# Patient Record
Sex: Male | Born: 1955 | Race: Black or African American | Hispanic: No | Marital: Married | Smoking: Never smoker
Health system: Southern US, Community
[De-identification: ages and names within clinical notes are randomized; demographics above are authoritative.]

## PROBLEM LIST (undated history)

## (undated) DIAGNOSIS — I10 Essential (primary) hypertension: Secondary | ICD-10-CM

## (undated) DIAGNOSIS — I499 Cardiac arrhythmia, unspecified: Secondary | ICD-10-CM

## (undated) DIAGNOSIS — F419 Anxiety disorder, unspecified: Secondary | ICD-10-CM

## (undated) DIAGNOSIS — IMO0001 Reserved for inherently not codable concepts without codable children: Secondary | ICD-10-CM

## (undated) DIAGNOSIS — N200 Calculus of kidney: Secondary | ICD-10-CM

## (undated) DIAGNOSIS — I509 Heart failure, unspecified: Secondary | ICD-10-CM

## (undated) HISTORY — DX: Heart failure, unspecified: I50.9

## (undated) HISTORY — PX: KNEE SURGERY: SHX244

---

## 2008-06-23 ENCOUNTER — Emergency Department: Payer: Self-pay | Admitting: Emergency Medicine

## 2009-10-18 ENCOUNTER — Emergency Department: Payer: Self-pay | Admitting: Emergency Medicine

## 2012-01-16 ENCOUNTER — Emergency Department: Payer: Self-pay | Admitting: Emergency Medicine

## 2014-11-19 ENCOUNTER — Observation Stay
Admit: 2014-11-19 | Discharge: 2014-11-19 | Disposition: A | Discharge status: Home / Self Care | Payer: No Typology Code available for payment source | Attending: Internal Medicine | Admitting: Internal Medicine

## 2014-11-19 ENCOUNTER — Observation Stay: Payer: No Typology Code available for payment source

## 2014-11-19 ENCOUNTER — Emergency Department: Payer: No Typology Code available for payment source

## 2014-11-19 ENCOUNTER — Encounter: Payer: Self-pay | Admitting: *Deleted

## 2014-11-19 ENCOUNTER — Observation Stay
Admission: EM | Admit: 2014-11-19 | Discharge: 2014-11-20 | Disposition: A | Discharge status: Home / Self Care | Payer: No Typology Code available for payment source | Attending: Internal Medicine | Admitting: Internal Medicine

## 2014-11-19 DIAGNOSIS — I420 Dilated cardiomyopathy: Secondary | ICD-10-CM | POA: Insufficient documentation

## 2014-11-19 DIAGNOSIS — I10 Essential (primary) hypertension: Secondary | ICD-10-CM | POA: Diagnosis not present

## 2014-11-19 DIAGNOSIS — R0602 Shortness of breath: Secondary | ICD-10-CM | POA: Diagnosis not present

## 2014-11-19 DIAGNOSIS — Z87442 Personal history of urinary calculi: Secondary | ICD-10-CM | POA: Insufficient documentation

## 2014-11-19 DIAGNOSIS — I517 Cardiomegaly: Secondary | ICD-10-CM | POA: Diagnosis present

## 2014-11-19 DIAGNOSIS — E785 Hyperlipidemia, unspecified: Secondary | ICD-10-CM | POA: Insufficient documentation

## 2014-11-19 DIAGNOSIS — I2510 Atherosclerotic heart disease of native coronary artery without angina pectoris: Secondary | ICD-10-CM | POA: Diagnosis not present

## 2014-11-19 DIAGNOSIS — R0600 Dyspnea, unspecified: Secondary | ICD-10-CM | POA: Diagnosis not present

## 2014-11-19 DIAGNOSIS — R0789 Other chest pain: Principal | ICD-10-CM | POA: Insufficient documentation

## 2014-11-19 DIAGNOSIS — I447 Left bundle-branch block, unspecified: Secondary | ICD-10-CM | POA: Insufficient documentation

## 2014-11-19 DIAGNOSIS — R079 Chest pain, unspecified: Secondary | ICD-10-CM | POA: Insufficient documentation

## 2014-11-19 HISTORY — DX: Calculus of kidney: N20.0

## 2014-11-19 LAB — TROPONIN I
TROPONIN I: 0.03 ng/mL (ref <0.031)
Troponin I: 0.03 ng/mL (ref <0.031)
Troponin I: 0.03 ng/mL (ref <0.031)
Troponin I: <0.03 ng/mL (ref <0.031)

## 2014-11-19 LAB — CBC
HCT: 43.7 % (ref 40.0–52.0)
HEMOGLOBIN: 14.4 g/dL (ref 13.0–18.0)
MCH: 27.3 pg (ref 26.0–34.0)
MCHC: 33 g/dL (ref 32.0–36.0)
MCV: 82.7 fL (ref 80.0–100.0)
Platelets: 142 10*3/uL — ABNORMAL LOW (ref 150–440)
RBC: 5.29 MIL/uL (ref 4.40–5.90)
RDW: 13.6 % (ref 11.5–14.5)
WBC: 8.9 10*3/uL (ref 3.8–10.6)

## 2014-11-19 LAB — NM MYOCAR MULTI W/SPECT W/WALL MOTION / EF
CHL CUP NUCLEAR SDS: 4
LV dias vol: 253 mL
LVSYSVOL: 194 mL
SRS: 0
SSS: 4
TID: 1.68

## 2014-11-19 LAB — BASIC METABOLIC PANEL
Anion gap: 9 (ref 5–15)
BUN: 16 mg/dL (ref 6–20)
CHLORIDE: 107 mmol/L (ref 101–111)
CO2: 24 mmol/L (ref 22–32)
Calcium: 9.2 mg/dL (ref 8.9–10.3)
Creatinine, Ser: 1.27 mg/dL — ABNORMAL HIGH (ref 0.61–1.24)
GFR calc Af Amer: >60 mL/min (ref >60)
Glucose, Bld: 138 mg/dL — ABNORMAL HIGH (ref 65–99)
Potassium: 3.6 mmol/L (ref 3.5–5.1)
Sodium: 140 mmol/L (ref 135–145)

## 2014-11-19 LAB — LIPID PANEL
CHOLESTEROL: 184 mg/dL (ref 0–200)
HDL: 40 mg/dL — ABNORMAL LOW (ref >40)
LDL Cholesterol: 106 mg/dL — ABNORMAL HIGH (ref 0–99)
Total CHOL/HDL Ratio: 4.6 RATIO
Triglycerides: 188 mg/dL — ABNORMAL HIGH (ref <150)
VLDL: 38 mg/dL (ref 0–40)

## 2014-11-19 LAB — BRAIN NATRIURETIC PEPTIDE: B Natriuretic Peptide: 229 pg/mL — ABNORMAL HIGH (ref 0.0–100.0)

## 2014-11-19 MED ORDER — HEPARIN SODIUM (PORCINE) 5000 UNIT/ML IJ SOLN
5000.0000 [IU] | Freq: Three times a day (TID) | INTRAMUSCULAR | Status: DC
  Administered 2014-11-19 – 2014-11-20 (×4): 5000 [IU] via SUBCUTANEOUS
  Filled 2014-11-19 (×4): qty 1

## 2014-11-19 MED ORDER — VITAMIN B-6 100 MG PO TABS
500.0000 mg | ORAL_TABLET | Freq: Every day | ORAL | Status: DC
  Administered 2014-11-19: 500 mg via ORAL
  Filled 2014-11-19 (×2): qty 5

## 2014-11-19 MED ORDER — TECHNETIUM TC 99M SESTAMIBI - CARDIOLITE
33.0000 | Freq: Once | INTRAVENOUS | Status: AC | PRN
  Administered 2014-11-19: 10:00:00 32.365 via INTRAVENOUS

## 2014-11-19 MED ORDER — ASPIRIN EC 81 MG PO TBEC
81.0000 mg | DELAYED_RELEASE_TABLET | Freq: Every day | ORAL | Status: DC
  Administered 2014-11-19: 81 mg via ORAL
  Filled 2014-11-19: qty 1

## 2014-11-19 MED ORDER — ONDANSETRON HCL 4 MG PO TABS: 4.0000 mg | ORAL_TABLET | Freq: Four times a day (QID) | ORAL | Status: DC | PRN

## 2014-11-19 MED ORDER — ONDANSETRON HCL 4 MG/2ML IJ SOLN: 4.0000 mg | Freq: Four times a day (QID) | INTRAMUSCULAR | Status: DC | PRN

## 2014-11-19 MED ORDER — TECHNETIUM TC 99M SESTAMIBI - CARDIOLITE
14.0650 | Freq: Once | INTRAVENOUS | Status: AC | PRN
  Administered 2014-11-19: 14.065 via INTRAVENOUS

## 2014-11-19 MED ORDER — SODIUM CHLORIDE 0.9 % IJ SOLN
3.0000 mL | Freq: Two times a day (BID) | INTRAMUSCULAR | Status: DC
  Administered 2014-11-19 (×3): 3 mL via INTRAVENOUS

## 2014-11-19 MED ORDER — METOPROLOL TARTRATE 50 MG PO TABS
50.0000 mg | ORAL_TABLET | Freq: Two times a day (BID) | ORAL | Status: DC
  Administered 2014-11-19 – 2014-11-20 (×3): 50 mg via ORAL
  Filled 2014-11-19 (×3): qty 1

## 2014-11-19 MED ORDER — ADULT MULTIVITAMIN W/MINERALS CH
1.0000 | ORAL_TABLET | Freq: Every day | ORAL | Status: DC
  Administered 2014-11-19 – 2014-11-20 (×2): 1 via ORAL
  Filled 2014-11-19 (×2): qty 1

## 2014-11-19 MED ORDER — MORPHINE SULFATE 2 MG/ML IJ SOLN: 2.0000 mg | INTRAMUSCULAR | Status: DC | PRN

## 2014-11-19 MED ORDER — OXYCODONE HCL 5 MG PO TABS: 5.0000 mg | ORAL_TABLET | ORAL | Status: DC | PRN

## 2014-11-19 MED ORDER — ATORVASTATIN CALCIUM 10 MG PO TABS
10.0000 mg | ORAL_TABLET | Freq: Every day | ORAL | Status: DC
  Administered 2014-11-19 – 2014-11-20 (×2): 10 mg via ORAL
  Filled 2014-11-19 (×2): qty 1

## 2014-11-19 MED ORDER — ASPIRIN 81 MG PO CHEW
81.0000 mg | CHEWABLE_TABLET | ORAL | Status: AC
  Administered 2014-11-20: 81 mg via ORAL
  Filled 2014-11-19: qty 1

## 2014-11-19 MED ORDER — ACETAMINOPHEN 650 MG RE SUPP: 650.0000 mg | Freq: Four times a day (QID) | RECTAL | Status: DC | PRN

## 2014-11-19 MED ORDER — ASPIRIN 81 MG PO TBEC: 81.0000 mg | DELAYED_RELEASE_TABLET | Freq: Every day | ORAL | Status: DC

## 2014-11-19 MED ORDER — ACETAMINOPHEN 325 MG PO TABS: 650.0000 mg | ORAL_TABLET | Freq: Four times a day (QID) | ORAL | Status: DC | PRN

## 2014-11-19 NOTE — ED Notes (Signed)
Admitting MD at bedside.

## 2014-11-19 NOTE — ED Notes (Signed)
Pt arrives via ems with c/o chest pressure. En route, ekg normal and vss. No meds prior to arrival .

## 2014-11-19 NOTE — H&P (Signed)
[facility] - [place] at [facility]   PATIENT NAME: Jerry Howe    MR#:  [id number]  DATE OF BIRTH:  Feb 07, 1956   DATE OF ADMISSION:  11/19/2014  PRIMARY CARE PHYSICIAN: No primary care provider on file.   REQUESTING/REFERRING PHYSICIAN: Manson Passey  CHIEF COMPLAINT:   Chief Complaint  Patient presents with  . Chest Pain    HISTORY OF PRESENT ILLNESS:  Jerry Howe  is a 59 y.o. male without significant past medical history (that doesn't seek frequent medical care) presenting with chest pain. Chest pain occurred during exertion, states that he was lifting heavy objects (excess of 100 pounds) for work. During that time. He developed a retrosternal chest pain described as pressure in quality 8/10 intensity nonradiating and no worsening  Factors, relieved with rest. He also describes associated shortness of breath. All symptoms resolved at this time  PAST MEDICAL HISTORY:   Past Medical History  Diagnosis Date  . Kidney stones     PAST SURGICAL HISTORY:  History reviewed. No pertinent past surgical history.  SOCIAL HISTORY:   History  Substance Use Topics  . Smoking status: Never Smoker   . Smokeless tobacco: Not on file  . Alcohol Use: No    FAMILY HISTORY:   Family History  Problem Relation Age of Onset  . Heart failure Neg Hx   Diabetes multiple family members  DRUG ALLERGIES:   Allergies  Allergen Reactions  . Shellfish Allergy Anaphylaxis    REVIEW OF SYSTEMS:  REVIEW OF SYSTEMS:  CONSTITUTIONAL: Denies fevers, chills, fatigue, weakness.  EYES: Denies blurred vision, double vision, or eye pain.  EARS, NOSE, THROAT: Denies tinnitus, ear pain, hearing loss.  RESPIRATORY: denies cough,positive  shortness of breath, wheezing  CARDIOVASCULAR:Positive chest pain  denies palpitations, edema.  GASTROINTESTINAL: Denies nausea, vomiting, diarrhea, abdominal pain.  GENITOURINARY: Denies dysuria, hematuria.  ENDOCRINE: Denies nocturia or  thyroid problems. HEMATOLOGIC AND LYMPHATIC: Denies easy bruising or bleeding.  SKIN: Denies rash or lesions.  MUSCULOSKELETAL: Denies pain in neck, back, shoulder, knees, hips, or further arthritic symptoms.  NEUROLOGIC: Denies paralysis, paresthesias.  PSYCHIATRIC: Denies anxiety or depressive symptoms. Otherwise full review of systems performed by me is negative.   MEDICATIONS AT HOME:   Prior to Admission medications   Medication Sig Start Date End Date Taking? Authorizing Provider  Cyanocobalamin (VITAMIN B 12 PO) Take 1 tablet by mouth daily.   Yes Historical Provider, MD  Multiple Vitamin (MULTIVITAMIN WITH MINERALS) TABS tablet Take 1 tablet by mouth daily.   Yes Historical Provider, MD  Pyridoxine HCl (VITAMIN B-6) 500 MG tablet Take 500 mg by mouth daily.   Yes Historical Provider, MD      VITAL SIGNS:  Blood pressure 143/87, pulse 73, resp. rate 17, height 5\' 8"  (1.727 m), weight 244 lb (110.678 kg), SpO2 97 %.  PHYSICAL EXAMINATION:  VITAL SIGNS: Filed Vitals:   11/19/14 0230  BP: 143/87  Pulse: 73  Resp: 17   GENERAL:58 y.o.male currently in no acute distress.  HEAD: Normocephalic, atraumatic.  EYES: Pupils equal, round, reactive to light. Extraocular muscles intact. No scleral icterus.  MOUTH: Moist mucosal membrane. Dentition intact. No abscess noted.  EAR, NOSE, THROAT: Clear without exudates. No external lesions.  NECK: Supple. No thyromegaly. No nodules. No JVD.  PULMONARY: Clear to ascultation, without wheeze rails or rhonci. No use of accessory muscles, Good respiratory effort. good air entry bilaterally CHEST: Nontender to palpation.  CARDIOVASCULAR: S1 and S2. Regular rate and rhythm. No  murmurs, rubs, or gallops. No edema. Pedal pulses 2+ bilaterally.  GASTROINTESTINAL: Soft, nontender, nondistended. No masses. Positive bowel sounds. No hepatosplenomegaly.  MUSCULOSKELETAL: No swelling, clubbing, or edema. Range of motion full in all extremities.   NEUROLOGIC: Cranial nerves II through XII are intact. No gross focal neurological deficits. Sensation intact. Reflexes intact.  SKIN: No ulceration, lesions, rashes, or cyanosis. Skin warm and dry. Turgor intact.  PSYCHIATRIC: Mood, affect within normal limits. The patient is awake, alert and oriented x 3. Insight, judgment intact.    LABORATORY PANEL:   CBC  Recent Labs Lab 11/19/14 0018  WBC 8.9  HGB 14.4  HCT 43.7  PLT 142*   ------------------------------------------------------------------------------------------------------------------  Chemistries   Recent Labs Lab 11/19/14 0018  NA 140  K 3.6  CL 107  CO2 24  GLUCOSE 138*  BUN 16  CREATININE 1.27*  CALCIUM 9.2   ------------------------------------------------------------------------------------------------------------------  Cardiac Enzymes  Recent Labs Lab 11/19/14 0018  TROPONINI <0.03   ------------------------------------------------------------------------------------------------------------------  RADIOLOGY:  Dg Chest 2 View  11/19/2014   CLINICAL DATA:  Chest pressure  EXAM: CHEST  2 VIEW  COMPARISON:  None.  FINDINGS: There is an enlarged cardiac silhouette due to cardiomegaly or pericardial effusion. The lungs are clear. There is no pleural effusion. Hilar and mediastinal contours are normal.  IMPRESSION: Enlarged cardiac silhouette.   Electronically Signed   By: Ellery Plunk M.D.   On: 11/19/2014 00:34    EKG:   Orders placed or performed during the hospital encounter of 11/19/14  . ED EKG within 10 minutes  . ED EKG within 10 minutes    IMPRESSION AND PLAN:   60 year old African-American gentleman without significant history presenting with chest pain.  1. Chest pain, central: Initiate aspirin, statin therapy place on telemetry trend cardiac enzymes, will order stress testing given classic symptoms 2. Hypertension essential: Blood pressure elevated on arrival currently in better  range will likely need chronic medication prior to discharge follow-up blood pressure trend for now 3. Venous thrombus embolism prophylactic: Heparin subcutaneous   All the records are reviewed and case discussed with ED provider. Management plans discussed with the patient, family and they are in agreement.  CODE STATUS: Full  TOTAL TIME TAKING CARE OF THIS PATIENT: 35  minutes.    Hower,  Mardi Mainland.D on 11/19/2014 at 2:48 AM  Between 7am to 6pm - Pager - [phone]  After 6pm: House Pager: - [phone]  [facility]  Office  [phone]  CC: Primary care physician; No primary care provider on file.

## 2014-11-19 NOTE — ED Notes (Signed)
MD at bedside. 

## 2014-11-19 NOTE — Progress Notes (Signed)
Cardiac cath education  Brochure provided,  Consent sign in the chart , no distress noted.

## 2014-11-19 NOTE — Progress Notes (Signed)
*  PRELIMINARY RESULTS* Echocardiogram 2D Echocardiogram has been performed.  Georgann Housekeeper Hege 11/19/2014, 11:36 AM

## 2014-11-19 NOTE — Consult Note (Signed)
[facility] Cardiology  CARDIOLOGY CONSULT NOTE  Patient ID: Jerry Howe MRN: [id number] DOB/AGE: 10/27/1955 59 y.o.  Admit date: 11/19/2014 Referring Physician Hower Primary Physician no primary care provider Primary Cardiologist  Reason for Consultation progressive exertional dyspnea  HPI: 59 year old gentleman with progressive exertional dyspnea, severely reduced left ventricular function, and evidence of ischemia on ET sestamibi study. The patient reports a 6-12 month history of progressive exertional dyspnea. Patient was recently at work, experience shortness of breath with chest tightness. EMS was called, patient was brought to [facility] emergency room where initial EKG revealed sinus rhythm with incomplete left bundle-branch block. Patient ruled out for myocardial infarction by CPK isoenzymes and troponin. The patient underwent ETT sestamibi study earlier today, exercised 6 minutes on a Bruce protocol with shortness of breath. The scintigraphy revealed LV ejection fraction of 20%. SPECT imaging revealed evidence for anterior and apical wall ischemia.  Review of systems complete and found to be negative unless listed above     Past Medical History  Diagnosis Date  . Kidney stones     History reviewed. No pertinent past surgical history.  Prescriptions prior to admission  Medication Sig Dispense Refill Last Dose  . Cyanocobalamin (VITAMIN B 12 PO) Take 1 tablet by mouth daily.     . Multiple Vitamin (MULTIVITAMIN WITH MINERALS) TABS tablet Take 1 tablet by mouth daily.     . Pyridoxine HCl (VITAMIN B-6) 500 MG tablet Take 500 mg by mouth daily.      History   Social History  . Marital Status: Married    Spouse Name: N/A  . Number of Children: N/A  . Years of Education: N/A   Occupational History  . Not on file.   Social History Main Topics  . Smoking status: Never Smoker   . Smokeless tobacco: Not on file  . Alcohol Use: No  . Drug Use: No  . Sexual Activity: Not on file    Other Topics Concern  . Not on file   Social History Narrative  . No narrative on file    Family History  Problem Relation Age of Onset  . Heart failure Neg Hx       Review of systems complete and found to be negative unless listed above      PHYSICAL EXAM  General: Well developed, well nourished, in no acute distress HEENT:  Normocephalic and atramatic Neck:  No JVD.  Lungs: Clear bilaterally to auscultation and percussion. Heart: HRRR . Normal S1 and S2 without gallops or murmurs.  Abdomen: Bowel sounds are positive, abdomen soft and non-tender  Msk:  Back normal, normal gait. Normal strength and tone for age. Extremities: No clubbing, cyanosis or edema.   Neuro: Alert and oriented X 3. Psych:  Good affect, responds appropriately  Labs:   Lab Results  Component Value Date   WBC 8.9 11/19/2014   HGB 14.4 11/19/2014   HCT 43.7 11/19/2014   MCV 82.7 11/19/2014   PLT 142* 11/19/2014    Recent Labs Lab 11/19/14 0018  NA 140  K 3.6  CL 107  CO2 24  BUN 16  CREATININE 1.27*  CALCIUM 9.2  GLUCOSE 138*   Lab Results  Component Value Date   TROPONINI 0.03 11/19/2014    Lab Results  Component Value Date   CHOL 184 11/19/2014   Lab Results  Component Value Date   HDL 40* 11/19/2014   Lab Results  Component Value Date   LDLCALC 106* 11/19/2014   Lab Results  Component Value Date   TRIG 188* 11/19/2014   Lab Results  Component Value Date   CHOLHDL 4.6 11/19/2014   No results found for: LDLDIRECT    Radiology: Dg Chest 2 View  11/19/2014   CLINICAL DATA:  Chest pressure  EXAM: CHEST  2 VIEW  COMPARISON:  None.  FINDINGS: There is an enlarged cardiac silhouette due to cardiomegaly or pericardial effusion. The lungs are clear. There is no pleural effusion. Hilar and mediastinal contours are normal.  IMPRESSION: Enlarged cardiac silhouette.   Electronically Signed   By: Ellery Plunk M.D.   On: 11/19/2014 00:34   Nm Myocar Multi W/spect W/wall  Motion / Ef  11/19/2014    Defect 1: There is a defect present in the apical anterior location.  Findings consistent with ischemia.  This is an intermediate risk study.  The left ventricular ejection fraction is severely decreased (<30%).  There was no ST segment deviation noted during stress.     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 Continue current medication 2. Review 2-D echocardiogram 3. Proceed with cardiac catheterization, scheduled for 11/20/14. The risks, benefits alternatives to cardiac catheterization and potential PCI were discussed with the patient and informed consent was obtained.   SignedMarcina Millard MD,PhD, [facility] 11/19/2014, 5:03 PM

## 2014-11-19 NOTE — ED Provider Notes (Signed)
[facility] Emergency Department Provider Note  ____________________________________________  Time seen: 12:30 AM  I have reviewed the triage vital signs and the nursing notes.   HISTORY  Chief Complaint Chest Pain      HPI Jerry Howe is a 59 y.o. male presents with central chest pressure that is nonradiating intermittently times a month however patient states pressure worsened tonight considerably accompanied by shortness of breath. Patient states chest pressure is worse with laying flat and relieved with sitting forward. Patient denies any fever no nausea no vomiting and no cough     Past Medical History  Diagnosis Date  . Kidney stones     There are no active problems to display for this patient.   History reviewed. No pertinent past surgical history.  Current Outpatient Rx  Name  Route  Sig  Dispense  Refill  . Cyanocobalamin (VITAMIN B 12 PO)   Oral   Take 1 tablet by mouth daily.         . Multiple Vitamin (MULTIVITAMIN WITH MINERALS) TABS tablet   Oral   Take 1 tablet by mouth daily.         . Pyridoxine HCl (VITAMIN B-6) 500 MG tablet   Oral   Take 500 mg by mouth daily.           Allergies Shellfish allergy  No family history on file.  Social History History  Substance Use Topics  . Smoking status: Never Smoker   . Smokeless tobacco: Not on file  . Alcohol Use: No    Review of Systems  Constitutional: Negative for fever. Eyes: Negative for visual changes. ENT: Negative for sore throat. Cardiovascular: Positive for chest pain. Respiratory: Negative for shortness of breath. Gastrointestinal: Negative for abdominal pain, vomiting and diarrhea. Genitourinary: Negative for dysuria. Musculoskeletal: Negative for back pain. Skin: Negative for rash. Neurological: Negative for headaches, focal weakness or numbness.   10-point ROS otherwise  negative.  ____________________________________________   PHYSICAL EXAM:  VITAL SIGNS: ED Triage Vitals  Enc Vitals Group     BP 11/19/14 0011 177/103 mmHg     Pulse Rate 11/19/14 0011 88     Resp 11/19/14 0011 18     Temp --      Temp Source 11/19/14 0011 Oral     SpO2 11/19/14 0011 97 %     Weight 11/19/14 0011 244 lb (110.678 kg)     Height 11/19/14 0011  (1.727 m)     Head Cir --      Peak Flow --      Pain Score 11/19/14 0011 8     Pain Loc --      Pain Edu? --      Excl. in GC? --      Constitutional: Alert and oriented. Well appearing and in no distress. Eyes: Conjunctivae are normal. PERRL. Normal extraocular movements. ENT   Head: Normocephalic and atraumatic.   Nose: No congestion/rhinnorhea.   Mouth/Throat: Mucous membranes are moist.   Neck: No stridor. Cardiovascular: Normal rate, regular rhythm. Normal and symmetric distal pulses are present in all extremities. No murmurs, rubs, or gallops. Respiratory: Normal respiratory effort without tachypnea nor retractions. Breath sounds are clear and equal bilaterally. No wheezes/rales/rhonchi. Gastrointestinal: Soft and nontender. No distention. There is no CVA tenderness. Genitourinary: deferred Musculoskeletal: Nontender with normal range of motion in all extremities. No joint effusions.  No lower extremity tenderness nor edema. Neurologic:  Normal speech and language. No gross focal neurologic  deficits are appreciated. Speech is normal.  Skin:  Skin is warm, dry and intact. No rash noted. Psychiatric: Mood and affect are normal. Speech and behavior are normal. Patient exhibits appropriate insight and judgment.  ____________________________________________    LABS (pertinent positives/negatives)  Labs Reviewed  BASIC METABOLIC PANEL - Abnormal; Notable for the following:    Glucose, Bld 138 (*)    Creatinine, Ser 1.27 (*)    All other components within normal limits  CBC - Abnormal; Notable  for the following:    Platelets 142 (*)    All other components within normal limits  TROPONIN I  BRAIN NATRIURETIC PEPTIDE     ____________________________________________   EKG  ED ECG REPORT I, BROWN, Stidham N, the attending physician, personally viewed and interpreted this ECG.   Date: 11/19/2014  EKG Time: 12:15 AM  Rate: 82  Rhythm: Normal sinus rhythm  Axis: None  Intervals: Normal  ST&T Change: None   ____________________________________________    RADIOLOGY  Chest x-ray revealed: Chest 2 View (Final result) Result time: 11/19/14 00:34:14   Final result by Rad Results In Interface (11/19/14 00:34:14)   Narrative:   CLINICAL DATA: Chest pressure  EXAM: CHEST 2 VIEW  COMPARISON: None.  FINDINGS: There is an enlarged cardiac silhouette due to cardiomegaly or pericardial effusion. The lungs are clear. There is no pleural effusion. Hilar and mediastinal contours are normal.  IMPRESSION: Enlarged cardiac silhouette.   Electronically Signed By: Ellery Plunk M.D. On: 11/19/2014 00:34          INITIAL IMPRESSION / ASSESSMENT AND PLAN / ED COURSE  Pertinent labs & imaging results that were available during my care of the patient were reviewed by me and considered in my medical decision making (see chart for details).  Given history physical exam and chest x-ray findings concern for possible pericardial effusion versus congestive heart failure. As such patient will be admitted to the hospital  ____________________________________________   FINAL CLINICAL IMPRESSION(S) / ED DIAGNOSES  Final diagnoses:  Chest pain, unspecified chest pain type  Cardiomegaly      Darci Current, MD 11/19/14 [phone]

## 2014-11-19 NOTE — Progress Notes (Signed)
Patient returned to unit from stress test remains alert and oriented, denies pain or discomfort, V/S stable family at bedside, will continue to monitor,

## 2014-11-19 NOTE — ED Notes (Signed)
Lab was notified of add on BNP

## 2014-11-20 ENCOUNTER — Encounter
Admission: EM | Disposition: A | Discharge status: Home / Self Care | Payer: Self-pay | Source: Home / Self Care | Attending: Emergency Medicine

## 2014-11-20 ENCOUNTER — Encounter: Payer: Self-pay | Admitting: *Deleted

## 2014-11-20 HISTORY — PX: CARDIAC CATHETERIZATION: SHX172

## 2014-11-20 SURGERY — LEFT HEART CATH AND CORONARY ANGIOGRAPHY
Anesthesia: LOCAL | Laterality: Right

## 2014-11-20 MED ORDER — SODIUM CHLORIDE 0.9 % IJ SOLN: 3.0000 mL | INTRAMUSCULAR | Status: DC | PRN

## 2014-11-20 MED ORDER — ASPIRIN 81 MG PO CHEW: 81.0000 mg | CHEWABLE_TABLET | Freq: Every day | ORAL | Status: DC

## 2014-11-20 MED ORDER — MIDAZOLAM HCL 2 MG/2ML IJ SOLN
INTRAMUSCULAR | Status: AC
  Filled 2014-11-20: qty 2

## 2014-11-20 MED ORDER — ONDANSETRON HCL 4 MG/2ML IJ SOLN: 4.0000 mg | Freq: Four times a day (QID) | INTRAMUSCULAR | Status: DC | PRN

## 2014-11-20 MED ORDER — MIDAZOLAM HCL 2 MG/2ML IJ SOLN
INTRAMUSCULAR | Status: DC | PRN
  Administered 2014-11-20: 1 mg via INTRAVENOUS

## 2014-11-20 MED ORDER — ACETAMINOPHEN 325 MG PO TABS: 650.0000 mg | ORAL_TABLET | ORAL | Status: DC | PRN

## 2014-11-20 MED ORDER — FAMOTIDINE 20 MG PO TABS
ORAL_TABLET | ORAL | Status: AC
  Administered 2014-11-20: 40 mg
  Filled 2014-11-20: qty 1

## 2014-11-20 MED ORDER — ATORVASTATIN CALCIUM 10 MG PO TABS: 10.0000 mg | ORAL_TABLET | Freq: Every day | ORAL | Status: DC

## 2014-11-20 MED ORDER — SODIUM CHLORIDE 0.9 % WEIGHT BASED INFUSION: 3.0000 mL/kg/h | INTRAVENOUS | Status: DC

## 2014-11-20 MED ORDER — LIDOCAINE HCL (PF) 1 % IJ SOLN
INTRAMUSCULAR | Status: DC | PRN
  Administered 2014-11-20: 16 mL

## 2014-11-20 MED ORDER — FENTANYL CITRATE (PF) 100 MCG/2ML IJ SOLN
INTRAMUSCULAR | Status: AC
  Filled 2014-11-20: qty 2

## 2014-11-20 MED ORDER — SODIUM CHLORIDE 0.9 % IV SOLN: 250.0000 mL | INTRAVENOUS | Status: DC | PRN

## 2014-11-20 MED ORDER — FENTANYL CITRATE (PF) 100 MCG/2ML IJ SOLN
INTRAMUSCULAR | Status: DC | PRN
  Administered 2014-11-20: 50 ug via INTRAVENOUS

## 2014-11-20 MED ORDER — METOPROLOL TARTRATE 50 MG PO TABS: 50.0000 mg | ORAL_TABLET | Freq: Two times a day (BID) | ORAL | Status: DC

## 2014-11-20 MED ORDER — IOHEXOL 300 MG/ML  SOLN
INTRAMUSCULAR | Status: DC | PRN
  Administered 2014-11-20: 80 mL via INTRA_ARTERIAL
  Administered 2014-11-20: 30 mL via INTRA_ARTERIAL

## 2014-11-20 MED ORDER — METHYLPREDNISOLONE SODIUM SUCC 125 MG IJ SOLR
INTRAMUSCULAR | Status: AC
  Administered 2014-11-20: 125 mg
  Filled 2014-11-20: qty 2

## 2014-11-20 MED ORDER — SODIUM CHLORIDE 0.9 % IJ SOLN: 3.0000 mL | Freq: Two times a day (BID) | INTRAMUSCULAR | Status: DC

## 2014-11-20 MED ORDER — LISINOPRIL 5 MG PO TABS: 5.0000 mg | ORAL_TABLET | Freq: Every day | ORAL | Status: DC

## 2014-11-20 MED ORDER — HEPARIN (PORCINE) IN NACL 2-0.9 UNIT/ML-% IJ SOLN
INTRAMUSCULAR | Status: AC
  Filled 2014-11-20: qty 1000

## 2014-11-20 MED ORDER — LISINOPRIL 5 MG PO TABS
5.0000 mg | ORAL_TABLET | Freq: Every day | ORAL | Status: DC
  Administered 2014-11-20: 5 mg via ORAL
  Filled 2014-11-20: qty 1

## 2014-11-20 SURGICAL SUPPLY — 9 items
CATH INFINITI 5FR ANG PIGTAIL (CATHETERS) ×2 IMPLANT
CATH INFINITI 5FR JL4 (CATHETERS) ×2 IMPLANT
CATH INFINITI JR4 5F (CATHETERS) ×2 IMPLANT
DEVICE CLOSURE MYNXGRIP 5F (Vascular Products) ×2 IMPLANT
KIT MANI 3VAL PERCEP (MISCELLANEOUS) ×2 IMPLANT
NEEDLE PERC 18GX7CM (NEEDLE) ×2 IMPLANT
PACK CARDIAC CATH (CUSTOM PROCEDURE TRAY) ×2 IMPLANT
SHEATH AVANTI 5FR X 11CM (SHEATH) ×2 IMPLANT
WIRE EMERALD 3MM-J .035X150CM (WIRE) ×2 IMPLANT

## 2014-11-20 NOTE — Discharge Instructions (Signed)
°  DIET:  Low fat, Low cholesterol diet, low sodium  DISCHARGE CONDITION:  Stable  ACTIVITY:  Activity as tolerated  OXYGEN:  Home Oxygen: No.   Oxygen Delivery: room air  DISCHARGE LOCATION:  home    ADDITIONAL DISCHARGE INSTRUCTION:   If you experience worsening of your admission symptoms, develop shortness of breath, life threatening emergency, suicidal or homicidal thoughts you must seek medical attention immediately by calling 911 or calling your MD immediately  if symptoms less severe.  You Must read complete instructions/literature along with all the possible adverse reactions/side effects for all the Medicines you take and that have been prescribed to you. Take any new Medicines after you have completely understood and accpet all the possible adverse reactions/side effects.   Please note  You were cared for by a hospitalist during your hospital stay. If you have any questions about your discharge medications or the care you received while you were in the hospital after you are discharged, you can call the unit and asked to speak with the hospitalist on call if the hospitalist that took care of you is not available. Once you are discharged, your primary care physician will handle any further medical issues. Please note that NO REFILLS for any discharge medications will be authorized once you are discharged, as it is imperative that you return to your primary care physician (or establish a relationship with a primary care physician if you do not have one) for your aftercare needs so that they can reassess your need for medications and monitor your lab values.

## 2014-11-20 NOTE — Progress Notes (Signed)
Pt discharged to home, iv and telemetry removed, reviewed dc instructions and home meds, rx given and printed home med list, work note provided, vascular dc instructions reviewed, new PCP list provided for patient, f/u appts scheduled and reviewed, patient and family verbalized understanding, no questions, escorted by volunteer

## 2014-11-20 NOTE — Progress Notes (Signed)
Pt received back from cardiac cath, no distress or pain noted, 2+ pedal pulses, right groin site CDI, iv fluids infusing, will continue to assess

## 2014-11-20 NOTE — Progress Notes (Signed)
[facility] - [place] at [facility]        Jerry Howe was admitted to the Hospital on 11/19/2014 and Discharged  11/20/2014 and should be excused from work/school   For 8 days starting 11/19/2014 , may return to work/school without any restrictions.  Call [employer] with questions.  Auburn Bilberry M.D on 11/20/2014,at 2:17 PM  [facility] - [place] at [facility]  [phone]

## 2014-11-21 ENCOUNTER — Encounter: Payer: Self-pay | Admitting: Cardiology

## 2014-11-21 NOTE — Discharge Summary (Signed)
Jerry Howe, 59 y.o., DOB 1956/01/25, MRN [id number]. Admission date: 11/19/2014 Discharge Date 11/21/2014 Primary MD No primary care provider on file. 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 Patient was evaluated in the ED he was also having acute chest pain therefore he was admitted and had a stress test scheduled. Patient had a stress test which was abnormal therefore underwent a cardiac catheterization the cardiac catheter showed only 20% LAD occlusion however he was noted to have a systolic dysfunction of less than 30%. Which was confirmed by the echocardiogram. He was thought to have nonischemic cardiomyopathy. He is started on ACE inhibitor and metoprolol. He will be seen by cardiology as outpatient for consideration for possible ICD.            Consults  cardiology  Significant Tests:  See full reports for all details    Dg Chest 2 View  11/19/2014   CLINICAL DATA:  Chest pressure  EXAM: CHEST  2 VIEW  COMPARISON:  None.  FINDINGS: There is an enlarged cardiac silhouette due to cardiomegaly or pericardial effusion. The lungs are clear. There is no pleural effusion. Hilar and mediastinal contours are normal.  IMPRESSION: Enlarged cardiac silhouette.   Electronically Signed   By: Ellery Plunk M.D.   On: 11/19/2014 00:34   Nm Myocar Multi W/spect W/wall Motion / Ef  11/19/2014    Defect 1: There is a defect present in the apical anterior location.   Findings consistent with ischemia.  This is an intermediate risk study.  The left ventricular ejection fraction is severely decreased (<30%).  There was no ST segment deviation noted during stress.        Today   Subjective:   Jerry Howe  feels better denies any chest pain or shortness of breath  Objective:   Blood pressure 103/75, pulse 68, temperature 98 F (36.7 C), temperature source Oral, resp. rate 16, height  (1.727 m), weight 105.15 kg (231 lb 13 oz), SpO2 93 %.  . No intake or output data in the 24 hours ending 11/21/14 1117  Exam VITAL SIGNS: Blood pressure 103/75, pulse 68, temperature 98 F (36.7 C), temperature source Oral, resp. rate 16, height  (1.727 m), weight 105.15 kg (231 lb 13 oz), SpO2 93 %.  GENERAL:  59 y.o.-year-old patient lying in the bed with no acute distress.  EYES: Pupils equal, round, reactive to light and accommodation. No scleral icterus. Extraocular muscles intact.  HEENT: Head atraumatic, normocephalic. Oropharynx and nasopharynx clear.  NECK:  Supple, no jugular venous distention. No thyroid enlargement, no tenderness.  LUNGS: Normal breath sounds bilaterally, no wheezing, rales,rhonchi or crepitation. No use of accessory muscles of respiration.  CARDIOVASCULAR: S1, S2 normal. No murmurs, rubs, or gallops.  ABDOMEN: Soft, nontender, nondistended. Bowel sounds present. No organomegaly or mass.  EXTREMITIES: No pedal edema, cyanosis, or clubbing.  NEUROLOGIC: Cranial nerves II through XII are intact. Muscle strength 5/5 in all extremities. Sensation intact. Gait not checked.  PSYCHIATRIC: The patient is  alert and oriented x 3.  SKIN: No obvious rash, lesion, or ulcer.   Data Review     CBC w Diff:  Lab Results  Component Value Date   WBC 8.9 11/19/2014   HGB 14.4 11/19/2014   HCT 43.7 11/19/2014   PLT 142* 11/19/2014   CMP:  Lab Results  Component Value Date   NA 140 11/19/2014   K 3.6 11/19/2014   CL 107 11/19/2014    CO2 24 11/19/2014   BUN 16 11/19/2014   CREATININE 1.27* 11/19/2014  .  Micro Results No results found for this or any previous visit (from the past 240 hour(s)).         Follow-up Information    Follow up with Ned Clines, MD In 2 weeks.   Specialty:  Specialist   Why:  Friday, August 19th at [place]   Contact information:   [address] [place] [address] [phone]       Follow up with Marcina Millard, MD.   Specialty:  Cardiology   Why:  Tuesday, August 16th at [place], [employer] information:   [address] [place] [address] [phone]       Discharge Medications     Medication List    TAKE these medications        aspirin 81 MG EC tablet  Take 1 tablet (81 mg total) by mouth daily.     atorvastatin 10 MG tablet  Commonly known as:  LIPITOR  Take 1 tablet (10 mg total) by mouth daily at 6 PM.     lisinopril 5 MG tablet  Commonly known as:  PRINIVIL,ZESTRIL  Take 1 tablet (5 mg total) by mouth daily.     metoprolol 50 MG tablet  Commonly known as:  LOPRESSOR  Take 1 tablet (50 mg total) by mouth 2 (two) times daily.     multivitamin with minerals Tabs tablet  Take 1 tablet by mouth daily.     VITAMIN B 12 PO  Take 1 tablet by mouth daily.     vitamin B-6 500 MG tablet  Take 500 mg by mouth daily.           Total Time in preparing paper work, data evaluation and todays exam - 35 minutes  Auburn Bilberry M.D on 11/21/2014 at 11:17 AM  [facility]   Office  [phone]

## 2014-11-21 NOTE — Progress Notes (Addendum)
[facility] - [place] at [facility]                                                                                                                                                                                            Patient Demographics   Jerry Howe, is a 59 y.o. male, DOB - 07-04-1955, WUJ:[id number]  Admit date - 11/19/2014   Admitting Physician Wyatt Haste, MD  Outpatient Primary MD for the patient is No primary care provider on file.   LOS -   Subjective: Patient presented with the chest pain and progressive shortness of breath. He reports that the shortness of breath his ongoing for the past 2 months. His chest pain is currently negative he did have a stress test earlier which was abnormal.     Review of Systems:   CONSTITUTIONAL: No documented fever. No fatigue, weakness. No weight gain, no weight loss.  EYES: No blurry or double vision.  ENT: No tinnitus. No postnasal drip. No redness of the oropharynx.  RESPIRATORY: No cough, no wheeze, no hemoptysis. Positive dyspnea.  CARDIOVASCULAR: Positive chest pain. No orthopnea. No palpitations. No syncope.  GASTROINTESTINAL: No nausea, no vomiting or diarrhea. No abdominal pain. No melena or hematochezia.  GENITOURINARY: No dysuria or hematuria.  ENDOCRINE: No polyuria or nocturia. No heat or cold intolerance.  HEMATOLOGY: No anemia. No bruising. No bleeding.  INTEGUMENTARY: No rashes. No lesions.  MUSCULOSKELETAL: No arthritis. No swelling. No gout.  NEUROLOGIC: No numbness, tingling, or ataxia. No seizure-type activity.  PSYCHIATRIC: No anxiety. No insomnia. No ADD.    Vitals:   Filed Vitals:   11/20/14 1318 11/20/14 1330 11/20/14 1336 11/20/14 1401  BP: 146/84 147/99  103/75  Pulse: 68 69 68 68  Temp:    98 F (36.7 C)  TempSrc:      Resp: Height:      Weight:      SpO2: 95% 93% 96% 93%    Wt Readings from Last 3 Encounters:  11/20/14 105.15 kg (231 lb 13 oz)    No  intake or output data in the 24 hours ending 11/21/14 1114  Physical Exam:   GENERAL: Pleasant-appearing in no apparent distress.  HEAD, EYES, EARS, NOSE AND THROAT: Atraumatic, normocephalic. Extraocular muscles are intact. Pupils equal and reactive to light. Sclerae anicteric. No conjunctival injection. No oro-pharyngeal erythema.  NECK: Supple. There is no jugular venous distention. No bruits, no lymphadenopathy, no thyromegaly.  HEART: Regular rate and rhythm,. No murmurs, no rubs, no clicks.  LUNGS: Clear to auscultation bilaterally. No rales or rhonchi. No wheezes.  ABDOMEN: Soft, flat, nontender, nondistended. Has good bowel sounds. No hepatosplenomegaly appreciated.  EXTREMITIES: No evidence of any cyanosis, clubbing, or peripheral edema.  +2 pedal and radial pulses bilaterally.  NEUROLOGIC: The patient is alert, awake, and oriented x3 with no focal motor or sensory deficits appreciated bilaterally.  SKIN: Moist and warm with no rashes appreciated.  Psych: Not anxious, depressed LN: No inguinal LN enlargement    Antibiotics   Anti-infectives    None      Medications   Scheduled Meds: Continuous Infusions: PRN Meds:.   Data Review:   Micro Results No results found for this or any previous visit (from the past 240 hour(s)).  Radiology Reports Dg Chest 2 View  11/19/2014   CLINICAL DATA:  Chest pressure  EXAM: CHEST  2 VIEW  COMPARISON:  None.  FINDINGS: There is an enlarged cardiac silhouette due to cardiomegaly or pericardial effusion. The lungs are clear. There is no pleural effusion. Hilar and mediastinal contours are normal.  IMPRESSION: Enlarged cardiac silhouette.   Electronically Signed   By: Ellery Plunk M.D.   On: 11/19/2014 00:34   Nm Myocar Multi W/spect W/wall Motion / Ef  11/19/2014    Defect 1: There is a defect present in the apical anterior location.  Findings consistent with ischemia.  This is an intermediate risk study.  The left ventricular  ejection fraction is severely decreased (<30%).  There was no ST segment deviation noted during stress.      CBC  Recent Labs Lab 11/19/14 0018  WBC 8.9  HGB 14.4  HCT 43.7  PLT 142*  MCV 82.7  MCH 27.3  MCHC 33.0  RDW 13.6    Chemistries   Recent Labs Lab 11/19/14 0018  NA 140  K 3.6  CL 107  CO2 24  GLUCOSE 138*  BUN 16  CREATININE 1.27*  CALCIUM 9.2   ------------------------------------------------------------------------------------------------------------------ estimated creatinine clearance is 74.5 mL/min (by C-G formula based on Cr of 1.27). ------------------------------------------------------------------------------------------------------------------ No results for input(s): HGBA1C in the last 72 hours. ------------------------------------------------------------------------------------------------------------------  Recent Labs  11/19/14 0443  CHOL 184  HDL 40*  LDLCALC 106*  TRIG 188*  CHOLHDL 4.6   ------------------------------------------------------------------------------------------------------------------ No results for input(s): TSH, T4TOTAL, T3FREE, THYROIDAB in the last 72 hours.  Invalid input(s): FREET3 ------------------------------------------------------------------------------------------------------------------ No results for input(s): VITAMINB12, FOLATE, FERRITIN, TIBC, IRON, RETICCTPCT in the last 72 hours.  Coagulation profile No results for input(s): INR, PROTIME in the last 168 hours.  No results for input(s): DDIMER in the last 72 hours.  Cardiac Enzymes  Recent Labs Lab 11/19/14 0443 11/19/14 1204 11/19/14 1533  TROPONINI 0.03 0.03 0.03   ------------------------------------------------------------------------------------------------------------------ Invalid input(s): POCBNP    Assessment & Plan   Patient is a 59 year old African American male with chest pain  1 chest pain: He has undergone a stress  test in which is abnormal. for possible anterior ischemia I have discussed the case with Dr. Ann Maki shows will be performing cardiac catheter tomorrow.  2. Hyperlipidemia we'll start him on a statin lowering medication  3. Elevated blood pressure no previous history of hypertension start him on metoprolol       Consults cardiology DVT Prophylaxis  Lovenox  Lab Results  Component Value Date   PLT 142* 11/19/2014     Time Spent in minutes   45 minutes     Auburn Bilberry M.D on 11/21/2014 at 11:14 AM  Between 7am to 6pm - Pager - [phone]  After 6pm go to [email or web address] - password [email or web address]  [facility]   Office  [phone]

## 2015-08-04 ENCOUNTER — Encounter
Admission: RE | Admit: 2015-08-04 | Discharge: 2015-08-04 | Disposition: A | Discharge status: Home / Self Care | Payer: BLUE CROSS/BLUE SHIELD | Source: Ambulatory Visit | Attending: Cardiology | Admitting: Cardiology

## 2015-08-04 DIAGNOSIS — I429 Cardiomyopathy, unspecified: Secondary | ICD-10-CM | POA: Insufficient documentation

## 2015-08-04 DIAGNOSIS — Z01812 Encounter for preprocedural laboratory examination: Secondary | ICD-10-CM | POA: Insufficient documentation

## 2015-08-04 HISTORY — DX: Essential (primary) hypertension: I10

## 2015-08-04 HISTORY — DX: Cardiac arrhythmia, unspecified: I49.9

## 2015-08-04 HISTORY — DX: Reserved for inherently not codable concepts without codable children: IMO0001

## 2015-08-04 HISTORY — DX: Anxiety disorder, unspecified: F41.9

## 2015-08-04 LAB — CBC
HEMATOCRIT: 40.7 % (ref 40.0–52.0)
HEMOGLOBIN: 13.9 g/dL (ref 13.0–18.0)
MCH: 28.2 pg (ref 26.0–34.0)
MCHC: 34.2 g/dL (ref 32.0–36.0)
MCV: 82.4 fL (ref 80.0–100.0)
Platelets: 171 10*3/uL (ref 150–440)
RBC: 4.95 MIL/uL (ref 4.40–5.90)
RDW: 13.4 % (ref 11.5–14.5)
WBC: 9.7 10*3/uL (ref 3.8–10.6)

## 2015-08-04 LAB — BASIC METABOLIC PANEL
ANION GAP: 8 (ref 5–15)
BUN: 17 mg/dL (ref 6–20)
CHLORIDE: 104 mmol/L (ref 101–111)
CO2: 25 mmol/L (ref 22–32)
CREATININE: 1.31 mg/dL — AB (ref 0.61–1.24)
Calcium: 9.4 mg/dL (ref 8.9–10.3)
GFR calc non Af Amer: 58 mL/min — ABNORMAL LOW (ref >60)
Glucose, Bld: 99 mg/dL (ref 65–99)
POTASSIUM: 4.1 mmol/L (ref 3.5–5.1)
Sodium: 137 mmol/L (ref 135–145)

## 2015-08-04 LAB — URINALYSIS COMPLETE WITH MICROSCOPIC (ARMC ONLY)
BACTERIA UA: NONE SEEN
BILIRUBIN URINE: NEGATIVE
Glucose, UA: NEGATIVE mg/dL
HGB URINE DIPSTICK: NEGATIVE
Ketones, ur: NEGATIVE mg/dL
LEUKOCYTES UA: NEGATIVE
Nitrite: NEGATIVE
Protein, ur: NEGATIVE mg/dL
RBC / HPF: NONE SEEN RBC/hpf (ref 0–5)
SQUAMOUS EPITHELIAL / LPF: NONE SEEN
Specific Gravity, Urine: 1.019 (ref 1.005–1.030)
pH: 5 (ref 5.0–8.0)

## 2015-08-04 LAB — PROTIME-INR
INR: 1.06
PROTHROMBIN TIME: 14 s (ref 11.4–15.0)

## 2015-08-04 LAB — SURGICAL PCR SCREEN
MRSA, PCR: NEGATIVE
Staphylococcus aureus: NEGATIVE

## 2015-08-04 NOTE — Pre-Procedure Instructions (Signed)
Component Name Value Range  LV Ejection Fraction (%) 25   Aortic Valve Regurgitation Grade trivial   Aortic Valve Stenosis Grade none   Aortic Valve Max Velocity (m/s) 1.2 m/sec   Aortic Valve Stenosis Mean Gradient (mmHg) 3.0 mmHg   Mitral Valve Regurgitation Grade mild   Mitral Valve Stenosis Grade none   Tricuspid Valve Regurgitation Grade trivial   Tricuspid Valve Regurgitation Max Velocity (m/s) 2.2 m/sec   Right Ventricle Systolic Pressure (mmHg) 24.4 mmHg   LV End Diastolic Diameter (cm) 7.2 cm  LV End Systolic Diameter (cm) 6.4 cm  LV Septum Wall Thickness (cm) 1.2 cm  LV Posterior Wall Thickness (cm) 0.79 cm  Left Atrium Diameter (cm) 4.7 cm   Result Narrative                     CARDIOLOGY DEPARTMENT                        SHEDDRICK, LATTANZIO                       [facility]                           [id number]                   [facility]                      Acct #: [id number]         [address] [place], [place] [address]            Date: 06/11/2015 09:22 AM                                                                 Adult Male Age: 60 yrs                    ECHOCARDIOGRAM REPORT                        Outpatient          STUDY:CHEST WALL         TAPE:0000:00: 0:00:00         KC::KCWC           ECHO:Yes   DOPPLER:Yes  FILE:[id number]             MD1:  PARASCHOS, ALEXANDER          COLOR:Yes  CONTRAST:No       MACHINE:Philips      RV BIOPSY:No         3D:No    SOUND QLTY:Moderate         MEDIUM:None ___________________________________________________________________________________________     HISTORY:Cardiomyopathy     REASON:Assess, LV function INDICATION:I50.23 Acute on chronic systolic (congestive) heart failure, I51.7            Cardiomegaly ___________________________________________________________________________________________  ECHOCARDIOGRAPHIC MEASUREMENTS 2D DIMENSIONS AORTA             Values      Normal Range      MAIN PA  Values      Normal Range           Annulus:  nm*       [2.3 - 2.9]                PA Main:  nm*       [1.5 - 2.1]         Aorta Sin:  nm*       [3.1 - 3.7]       RIGHT VENTRICLE       ST Junction:  nm*       [2.6 - 3.2]                RV Base:  3.6 cm    [ < 4.2]         Asc.Aorta:  nm*       [2.6 - 3.4]                 RV Mid:  nm*       [ < 3.5]  LEFT VENTRICLE                                        RV Length:  nm*       [ < 8.6]             LVIDd:  7.2 cm    [4.2 - 5.9]       INFERIOR VENA CAVA             LVIDs:  6.4 cm                              Max. IVC:  nm*       [ <= 2.1]                FS:  10.9 %    [> 25]                    Min. IVC:  nm*               SWT:  1.2 cm    [0.6 - 1.0]                   ------------------               PWT:  0.79 cm   [0.6 - 1.0]                   nm* - not measured  LEFT ATRIUM           LA Diam:  4.7 cm    [3.0 - 4.0]       LA A4C Area:  nm*       [ < 20]         LA Volume:  nm*       [18 - 58] ___________________________________________________________________________________________  ECHOCARDIOGRAPHIC DESCRIPTIONS  AORTIC ROOT         Size:Normal   Dissection:INDETERM FOR DISSECTION  AORTIC VALVE     Leaflets:Tricuspid               Morphology:Normal     Mobility:Fully mobile  LEFT VENTRICLE         Size:SEVERELY ENLARGED         Anterior:HYPOCONTRACTILE  Contraction:SEVERE GLOBAL  DECREASE     Lateral:HYPOCONTRACTILE   Closest EF:25% (Estimated)             Septal:HYPOCONTRACTILE    LV Masses:No Masses                   Apical:HYPOCONTRACTILE          LAG:TXMI                      Inferior:HYPOCONTRACTILE                                       Posterior:HYPOCONTRACTILE Dias.FxClass:(Grade 3) reversible restrictive pattern  MITRAL VALVE     Leaflets:Normal                    Mobility:Fully mobile   Morphology:Normal  LEFT ATRIUM         Size:MILDLY ENLARGED          LA Masses:No masses    IA Septum:Normal IAS  MAIN PA          Size:Normal  PULMONIC VALVE   Morphology:Normal                    Mobility:Fully mobile  RIGHT VENTRICLE    RV Masses:No Masses                     Size:Normal    Free Wall:Normal                 Contraction:Normal  TRICUSPID VALVE     Leaflets:Normal                    Mobility:Fully mobile   Morphology:Normal  RIGHT ATRIUM         Size:Normal                    RA Other:None      RA Mass:No masses  PERICARDIUM        Fluid:MILD EFFUSION POSTERIOR  INFERIOR VENACAVA         Size:Normal Normal respiratory collapse  ____________________________________________________________________  DOPPLER ECHO and OTHER SPECIAL PROCEDURES    Aortic:TRIVIAL AR                    No AS           117.0 cm/sec peak vel         5.5 mmHg peak grad           3.0 mmHg mean grad            3.2 cm^2 by DOPPLER     Mitral:MILD MR                       No MS                                         2.9 cm^2 by DOPPLER           MV Inflow E Vel=92.3 cm/sec   MV Annulus E'Vel=2.7 cm/sec           E/E'Ratio=34.2  Tricuspid:TRIVIAL TR                    No TS  220.0 cm/sec peak TR vel      24.4 mmHg peak RV pressure  Pulmonary:TRIVIAL PR                    No PS    ___________________________________________________________________________________________ INTERPRETATION SEVERE LV SYSTOLIC DYSFUNCTION WITH AN ESTIMATED EF = 25 % NORMAL RIGHT VENTRICULAR SYSTOLIC FUNCTION MILD MITRAL VALVE INSUFFICIENCY TRACE TRICUSPID AND AORTIC VALVE INSUFFICIENCY NO VALVULAR STENOSIS MODERATE-TO-SEVERE LV ENLARGEMENT MILD LA ENLARGEMENT   ___________________________________________________________________________________________ Electronically signed by: MD Jamse Mead on 06/12/2015 02:10 PM             Performed By: Luretha Murphy, RDCS, RVT       Ordering Physician: Marcina Millard  ___________________________________________________________________________________________   Status  Results Details

## 2015-08-04 NOTE — Patient Instructions (Signed)
  Your procedure is scheduled on: 08/15/15 Fri Report to Day Surgery.2nd floor medical  To find out your arrival time please call [phone] between 1PM - 3PM on 08/14/15 Thurs.  Remember: Instructions that are not followed completely may result in serious medical risk, up to and including death, or upon the discretion of your surgeon and anesthesiologist your surgery may need to be rescheduled.    _x___ 1. Do not eat food or drink liquids after midnight. No gum chewing or hard candies.     _x___ 2. No Alcohol for 24 hours before or after surgery.   ____ 3. Bring all medications with you on the day of surgery if instructed.    __x__ 4. Notify your doctor if there is any change in your medical condition     (cold, fever, infections).     Do not wear jewelry, make-up, hairpins, clips or nail polish.  Do not wear lotions, powders, or perfumes. You may wear deodorant.  Do not shave 48 hours prior to surgery. Men may shave face and neck.  Do not bring valuables to the hospital.    [facility] [place] is not responsible for any belongings or valuables.               Contacts, dentures or bridgework may not be worn into surgery.  Leave your suitcase in the car. After surgery it may be brought to your room.  For patients admitted to the hospital, discharge time is determined by your                treatment team.   Patients discharged the day of surgery will not be allowed to drive home.   Please read over the following fact sheets that you were given:      _x___ Take these medicines the morning of surgery with A SIP OF WATER:    1.lisinopril (PRINIVIL,ZESTRIL) 20 MG tablet  2. metoprolol (LOPRESSOR) 50 MG tablet  3.   4.  5.  6.  ____ Fleet Enema (as directed)   _x___ Use CHG Soap as directed  ____ Use inhalers on the day of surgery  ____ Stop metformin 2 days prior to surgery    ____ Take 1/2 of usual insulin dose the night before surgery and none on the morning of surgery.    _x___ Stop Coumadin/Plavix/aspirin on Stop aspirin 1 week before surgery  _x___ Stop Anti-inflammatories on stop 1 week before surgery may take tylenol as needed   ____ Stop supplements until after surgery.    ____ Bring C-Pap to the hospital.

## 2015-08-15 ENCOUNTER — Ambulatory Visit: Payer: BLUE CROSS/BLUE SHIELD | Admitting: Anesthesiology

## 2015-08-15 ENCOUNTER — Ambulatory Visit: Payer: BLUE CROSS/BLUE SHIELD

## 2015-08-15 ENCOUNTER — Encounter
Admission: RE | Disposition: A | Discharge status: Home / Self Care | Payer: Self-pay | Source: Ambulatory Visit | Attending: Cardiology

## 2015-08-15 ENCOUNTER — Encounter: Payer: Self-pay | Admitting: *Deleted

## 2015-08-15 ENCOUNTER — Observation Stay
Admission: RE | Admit: 2015-08-15 | Discharge: 2015-08-16 | Disposition: A | Discharge status: Home / Self Care | Payer: BLUE CROSS/BLUE SHIELD | Source: Ambulatory Visit | Attending: Cardiology | Admitting: Cardiology

## 2015-08-15 DIAGNOSIS — Z7982 Long term (current) use of aspirin: Secondary | ICD-10-CM | POA: Insufficient documentation

## 2015-08-15 DIAGNOSIS — Z79899 Other long term (current) drug therapy: Secondary | ICD-10-CM | POA: Diagnosis not present

## 2015-08-15 DIAGNOSIS — E7800 Pure hypercholesterolemia, unspecified: Secondary | ICD-10-CM | POA: Diagnosis not present

## 2015-08-15 DIAGNOSIS — Z91013 Allergy to seafood: Secondary | ICD-10-CM | POA: Insufficient documentation

## 2015-08-15 DIAGNOSIS — I420 Dilated cardiomyopathy: Secondary | ICD-10-CM | POA: Insufficient documentation

## 2015-08-15 DIAGNOSIS — Z95810 Presence of automatic (implantable) cardiac defibrillator: Secondary | ICD-10-CM

## 2015-08-15 DIAGNOSIS — R062 Wheezing: Secondary | ICD-10-CM | POA: Diagnosis not present

## 2015-08-15 DIAGNOSIS — I10 Essential (primary) hypertension: Secondary | ICD-10-CM | POA: Diagnosis not present

## 2015-08-15 DIAGNOSIS — I5022 Chronic systolic (congestive) heart failure: Secondary | ICD-10-CM | POA: Diagnosis present

## 2015-08-15 HISTORY — PX: IMPLANTABLE CARDIOVERTER DEFIBRILLATOR (ICD) GENERATOR CHANGE: SHX5469

## 2015-08-15 SURGERY — ICD GENERATOR CHANGE
Anesthesia: General | Laterality: Left | Wound class: Clean

## 2015-08-15 MED ORDER — FENTANYL CITRATE (PF) 100 MCG/2ML IJ SOLN: 25.0000 ug | INTRAMUSCULAR | Status: DC | PRN

## 2015-08-15 MED ORDER — ACETAMINOPHEN 325 MG PO TABS: 650.0000 mg | ORAL_TABLET | Freq: Four times a day (QID) | ORAL | Status: DC | PRN

## 2015-08-15 MED ORDER — CEFAZOLIN SODIUM-DEXTROSE 2-4 GM/100ML-% IV SOLN
2.0000 g | Freq: Once | INTRAVENOUS | Status: AC
  Administered 2015-08-15: 2 g via INTRAVENOUS

## 2015-08-15 MED ORDER — LACTATED RINGERS IV SOLN
INTRAVENOUS | Status: DC
  Administered 2015-08-15 (×2): via INTRAVENOUS

## 2015-08-15 MED ORDER — HYDROCODONE-ACETAMINOPHEN 10-325 MG PO TABS
1.0000 | ORAL_TABLET | ORAL | Status: DC | PRN
Start: 2015-08-15 — End: 2015-08-16
  Administered 2015-08-15: 1 via ORAL
  Filled 2015-08-15: qty 1

## 2015-08-15 MED ORDER — METOPROLOL TARTRATE 50 MG PO TABS
50.0000 mg | ORAL_TABLET | Freq: Two times a day (BID) | ORAL | Status: DC
  Administered 2015-08-15 – 2015-08-16 (×2): 50 mg via ORAL
  Filled 2015-08-15 (×2): qty 1

## 2015-08-15 MED ORDER — PHENYLEPHRINE HCL 10 MG/ML IJ SOLN
INTRAMUSCULAR | Status: DC | PRN
  Administered 2015-08-15: 100 ug via INTRAVENOUS

## 2015-08-15 MED ORDER — GENTAMICIN SULFATE 40 MG/ML IJ SOLN
Freq: Once | INTRAMUSCULAR | Status: DC
Start: 2015-08-15 — End: 2015-08-16
  Filled 2015-08-15 (×2): qty 2

## 2015-08-15 MED ORDER — FAMOTIDINE 20 MG PO TABS
ORAL_TABLET | ORAL | Status: AC
  Administered 2015-08-15: 20 mg via ORAL
  Filled 2015-08-15: qty 1

## 2015-08-15 MED ORDER — LISINOPRIL 20 MG PO TABS
20.0000 mg | ORAL_TABLET | Freq: Every day | ORAL | Status: DC
  Administered 2015-08-16: 20 mg via ORAL
  Filled 2015-08-15: qty 1

## 2015-08-15 MED ORDER — MIDAZOLAM HCL 2 MG/2ML IJ SOLN
INTRAMUSCULAR | Status: DC | PRN
  Administered 2015-08-15: 2 mg via INTRAVENOUS

## 2015-08-15 MED ORDER — FENTANYL CITRATE (PF) 100 MCG/2ML IJ SOLN
INTRAMUSCULAR | Status: DC | PRN
  Administered 2015-08-15: 50 ug via INTRAVENOUS

## 2015-08-15 MED ORDER — EPHEDRINE SULFATE 50 MG/ML IJ SOLN
INTRAMUSCULAR | Status: DC | PRN
  Administered 2015-08-15: 5 mg via INTRAVENOUS

## 2015-08-15 MED ORDER — SODIUM CHLORIDE 0.9 % IR SOLN
Status: DC | PRN
  Administered 2015-08-15: 300 mL

## 2015-08-15 MED ORDER — LIDOCAINE 1 % OPTIME INJ - NO CHARGE
INTRAMUSCULAR | Status: DC | PRN
  Administered 2015-08-15: 10 mL

## 2015-08-15 MED ORDER — CEFAZOLIN SODIUM-DEXTROSE 2-4 GM/100ML-% IV SOLN
INTRAVENOUS | Status: AC
  Filled 2015-08-15: qty 100

## 2015-08-15 MED ORDER — ACETAMINOPHEN 325 MG PO TABS
325.0000 mg | ORAL_TABLET | ORAL | Status: DC | PRN
  Administered 2015-08-15: 650 mg via ORAL
  Filled 2015-08-15: qty 2

## 2015-08-15 MED ORDER — ONDANSETRON HCL 4 MG/2ML IJ SOLN
INTRAMUSCULAR | Status: DC | PRN
  Administered 2015-08-15: 4 mg via INTRAVENOUS

## 2015-08-15 MED ORDER — SODIUM CHLORIDE 0.9 % IV SOLN
INTRAVENOUS | Status: DC | PRN
  Administered 2015-08-15: 10 mL via INTRAMUSCULAR

## 2015-08-15 MED ORDER — ASPIRIN EC 81 MG PO TBEC
81.0000 mg | DELAYED_RELEASE_TABLET | Freq: Every day | ORAL | Status: DC
  Administered 2015-08-16: 81 mg via ORAL
  Filled 2015-08-15: qty 1

## 2015-08-15 MED ORDER — PROPOFOL 10 MG/ML IV BOLUS
INTRAVENOUS | Status: DC | PRN
  Administered 2015-08-15: 50 mg via INTRAVENOUS

## 2015-08-15 MED ORDER — ONDANSETRON HCL 4 MG/2ML IJ SOLN
4.0000 mg | Freq: Once | INTRAMUSCULAR | Status: DC | PRN
Start: 2015-08-15 — End: 2015-08-15

## 2015-08-15 MED ORDER — PROPOFOL 500 MG/50ML IV EMUL
INTRAVENOUS | Status: DC | PRN
  Administered 2015-08-15: 100 ug/kg/min via INTRAVENOUS

## 2015-08-15 MED ORDER — FAMOTIDINE 20 MG PO TABS
20.0000 mg | ORAL_TABLET | Freq: Once | ORAL | Status: AC
  Administered 2015-08-15: 20 mg via ORAL

## 2015-08-15 MED ORDER — SPIRONOLACTONE 25 MG PO TABS
25.0000 mg | ORAL_TABLET | Freq: Every day | ORAL | Status: DC
  Administered 2015-08-16: 25 mg via ORAL
  Filled 2015-08-15: qty 1

## 2015-08-15 MED ORDER — ONDANSETRON HCL 4 MG/2ML IJ SOLN: 4.0000 mg | Freq: Four times a day (QID) | INTRAMUSCULAR | Status: DC | PRN

## 2015-08-15 MED ORDER — ADULT MULTIVITAMIN W/MINERALS CH
1.0000 | ORAL_TABLET | Freq: Every day | ORAL | Status: DC
  Administered 2015-08-16: 1 via ORAL
  Filled 2015-08-15: qty 1

## 2015-08-15 MED ORDER — LIDOCAINE HCL (CARDIAC) 20 MG/ML IV SOLN
INTRAVENOUS | Status: DC | PRN
Start: 2015-08-15 — End: 2015-08-15
  Administered 2015-08-15: 80 mg via INTRAVENOUS

## 2015-08-15 SURGICAL SUPPLY — 43 items
BAG DECANTER FOR FLEXI CONT (MISCELLANEOUS) ×2 IMPLANT
CABLE SURG 12 DISP A/V CHANNEL (MISCELLANEOUS) ×2 IMPLANT
CANISTER SUCT 1200ML W/VALVE (MISCELLANEOUS) ×2 IMPLANT
CHLORAPREP W/TINT 26ML (MISCELLANEOUS) ×2 IMPLANT
COVER LIGHT HANDLE STERIS (MISCELLANEOUS) ×4 IMPLANT
COVER MAYO STAND STRL (DRAPES) ×2 IMPLANT
COVER PROBE FLX POLY STRL (MISCELLANEOUS) ×2 IMPLANT
DRAPE C-ARM XRAY 36X54 (DRAPES) ×2 IMPLANT
DRESSING TELFA 4X3 1S ST N-ADH (GAUZE/BANDAGES/DRESSINGS) ×2 IMPLANT
DRSG TEGADERM 4X4.75 (GAUZE/BANDAGES/DRESSINGS) ×2 IMPLANT
ELECT REM PT RETURN 9FT ADLT (ELECTROSURGICAL) ×2
ELECTRODE REM PT RTRN 9FT ADLT (ELECTROSURGICAL) ×1 IMPLANT
GLOVE BIO SURGEON STRL SZ7.5 (GLOVE) ×2 IMPLANT
GOWN STRL REUS W/ TWL LRG LVL3 (GOWN DISPOSABLE) ×1 IMPLANT
GOWN STRL REUS W/ TWL XL LVL3 (GOWN DISPOSABLE) ×1 IMPLANT
GOWN STRL REUS W/TWL LRG LVL3 (GOWN DISPOSABLE) ×1
GOWN STRL REUS W/TWL XL LVL3 (GOWN DISPOSABLE) ×1
HANDLE YANKAUER SUCT BULB TIP (MISCELLANEOUS) ×2 IMPLANT
ICD VISIA MRI VR DVFB1D4 (ICD Generator) ×1 IMPLANT
IMMOBILIZER SHDR LG LX 900803 (SOFTGOODS) ×2 IMPLANT
INTRO PACEMAKR LEAD 9FR 13CM (INTRODUCER) ×2
INTRODUCER PACEMKR LD 9FR 13CM (INTRODUCER) ×1 IMPLANT
IV NS 1000ML (IV SOLUTION) ×1
IV NS 1000ML BAXH (IV SOLUTION) ×1 IMPLANT
KIT RM TURNOVER STRD PROC AR (KITS) ×2 IMPLANT
LABEL OR SOLS (LABEL) ×2 IMPLANT
LEAD SPRINT QUAT SEC 6935M-62 (Lead) ×2 IMPLANT
NEEDLE FILTER BLUNT 18X 1/2SAF (NEEDLE) ×1
NEEDLE FILTER BLUNT 18X1 1/2 (NEEDLE) ×1 IMPLANT
NEEDLE HYPO 25X1 1.5 SAFETY (NEEDLE) ×2 IMPLANT
NEEDLE SPNL 22GX3.5 QUINCKE BK (NEEDLE) ×2 IMPLANT
NS IRRIG 500ML POUR BTL (IV SOLUTION) ×2 IMPLANT
PACK PACE INSERTION (MISCELLANEOUS) ×2 IMPLANT
PAD STATPAD (MISCELLANEOUS) ×2 IMPLANT
SUT DVC V-LOC 4-0 90 CLR P-12 (SUTURE) ×2
SUT DVC VLOC 3-0 CL 6 P-12 (SUTURE) ×2 IMPLANT
SUT SILK 0 CT 1 30 (SUTURE) ×2 IMPLANT
SUT VIC AB 3-0 PS2 18 (SUTURE) ×2 IMPLANT
SUTURE DVC V-LC4-0 90 CLR P-12 (SUTURE) ×1 IMPLANT
SYR CONTROL 10ML (SYRINGE) ×2 IMPLANT
SYRINGE 10CC LL (SYRINGE) ×2 IMPLANT
TOWEL OR 17X26 4PK STRL BLUE (TOWEL DISPOSABLE) ×2 IMPLANT
VISIA MRI VR DVFB1D4 (ICD Generator) ×2 IMPLANT

## 2015-08-15 NOTE — H&P (Signed)
Patient Profile:   Jerry Howe is a very pleasant 60 y.o. 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  Problem List:  Problem List Date Reviewed: 07-24-15  Codes Priority Class Noted - Resolved  Acute on chronic systolic CHF (congestive heart failure) (CMS-HCC) ICD-10-CM: K74.25 ICD-9-CM: 428.23, 428.0 12/03/2014 - Present  Non-ischemic cardiomyopathy (CMS-HCC) ICD-10-CM: I42.8 ICD-9-CM: 425.4 12/02/2014 - Present  Systolic dysfunction ICD-10-CM: I51.9 ICD-9-CM: 429.9 12/02/2014 - Present  Essential hypertension with goal blood pressure less than 140/90 ICD-10-CM: I10 ICD-9-CM: 401.9 12/02/2014 - Present  Hypercholesterolemia ICD-10-CM: E78.00 ICD-9-CM: 272.0 12/02/2014 - Present  Cardiomegaly ICD-10-CM: I51.7 ICD-9-CM: 429.3 12/02/2014 - Present  Chest pain, unspecified ICD-10-CM: R07.9 ICD-9-CM: 786.50 11/19/2014 - Present     Current Medications:  Current Outpatient Prescriptions  Medication Sig Dispense Refill  . albuterol 90 mcg/actuation inhaler Inhale 2 inhalations into the lungs every 6 (six) hours as needed for Wheezing. 1 Inhaler 12  . aspirin 81 MG EC tablet Take by mouth.  Marland Kitchen lisinopril (PRINIVIL,ZESTRIL) 5 MG tablet Take 2 tablets (10 mg total) by mouth once daily. 90 tablet 3  . metoprolol succinate (TOPROL-XL) 50 MG XL tablet Take 1 tablet (50 mg total) by mouth once daily. 30 tablet 11  . multivitamin with iron-minerals (SUPER THERA VITE M) tablet Take by mouth.  . pyridoxine, vitamin B6, (B-6) 500 MG tablet Take by mouth.  . cyanocobalamin 2000 MCG tablet Take by mouth.  . lovastatin (MEVACOR) 20 MG tablet Reported on 07/24/2015  . spironolactone (ALDACTONE) 25 MG tablet Take 1 tablet (25 mg total) by mouth once daily. (Patient not taking: Reported on 07/24/2015 ) 30 tablet 11   No current facility-administered medications for this  visit.    Allergies:  Shellfish containing products  History of Present Illness   Jerry Howe is a very pleasant 60 y.o. male with only controlled hypertension nonischemic cardiomyopathy. The patient has had shortness of breath, has not had chest pain, has not had near syncope, has not syncope, and has had palpitations. The patient has had orthopnea or PND. Initially presented in August 2016 with shortness of breath and fatigue. Also having chest pressure at that time. He underwent a cardiac catheterization which revealed insignificant coronary disease but elevated left ventricular end-diastolic pressure and frequent left ventricular dysfunction. That time he was diagnosed with a nonischemic cardiomyopathy thought to in part be due to uncontrolled hypertension for many years. Treated with lisinopril metoprolol and also Aldactone recent assessment of his EF shows that he continues to have chronic systolic heart failure no significant improvement in his ejection fraction. Echocardiogram June 11, 2015 shows a ejection fraction of 35%.   Family History   Patient denies family history of sudden cardiac arrest does have significant family history of blood pressure  Social History   Social History  . Marital status: Married  Spouse name: N/A  . Number of children: N/A  . Years of education: N/A   Occupational History  . Not on file.   Social History Main Topics  . Smoking status: Never Smoker  . Smokeless tobacco: Never Used  . Alcohol use No  . Drug use: No  . Sexual activity: Defer   Other Topics Concern  . Not on file   Social History Narrative   Review of Systems   14 systems reviewed pertinent positives and negatives as noted in the HPI and  below.  Physical Examinations   Vital Signs:  Visit Vitals  . BP (!) 156/92 (BP Location: Left upper arm, Patient Position: Sitting, BP Cuff Size: Large Adult)  . Pulse 64  . Ht 175.3 cm ( )  . Wt (!) 110 kg (242 lb 8 oz)  .  SpO2 99%  . BMI 35.81 kg/m2  Body mass index is 35.81 kg/(m^2). General appearance: He appears well, in no apparent distress. Alert and oriented times three.   Neck: supple, no significant adenopathy, carotids upstroke normal bilaterally, no bruits   Thyroid: thyroid is normal in size without nodules or tenderness   Lungs: clear to auscultation, no wheezes, rales or rhonchi, symmetric air entry   CV: normal rate and regular rhythm  Abdomen: soft, nontender, nondistended, no masses or organomegaly   Neurological: alert, oriented, normal speech, no focal findings or movement disorder noted   Musculoskeletal: full range of motion without pain, moves all extremities. Ambulates without difficulty.   Extremities:, no pedal edema, no clubbing or cyanosis   ECG  Rhythm: normal sinus rhythm, rate= 62 bpm, QTc= 414 Ms. QRS= 118 PVCs  Echocardiogram  Date: 06/11/2015 LA size: 5.3 Ejection fraction: 25  Significant valvular disease: yes.  Labs   Hematology No results found for: WBC, HGB, HCT, MCV, PLT  Chemistries No results found for: CREATININE, BUN, NA, K, CL, CO2  Liver Function Studies No results found for: ALT, AST, GGT, ALKPHOS, BILITOT  Thyroid Function Studies No results found for: TSH, T3TOTAL, T4TOTAL, THYROIDAB  INR No results found for: INR  Assessment and Plan  Given the patient's EF ejection fraction of 25% and NYHA class II symptoms, he is at an increased risk of sudden cardiac death and will likely benefit from an ICD. I spent some time with him reviewing the potential benefits and risks of the procedure. Specifically, I informed him that the potential risks include but are not limited to infection, bleeding, pneumothorax, and cardiac perforation with or without tamponade. He verbalized understanding, and he signed the consent form in my presence in clinic today. His procedure is scheduled for April. He knows to be n.p.o. after midnight the night before his  procedure. I gave him a scrub brush to use the morning of his procedure. We plan to implant a single coil single-chamber ICD in the left infraclavicular region, and I plan not to do DFT testing. I plan to give him vancomycin and clindamycin before his procedure. He knows to call us should he have any questions or problems at any time.

## 2015-08-15 NOTE — Transfer of Care (Signed)
Immediate Anesthesia Transfer of Care Note  Patient: Jerry Howe  Procedure(s) Performed: Procedure(s): ICD IMPLANT, single chamber (Left)  Patient Location: PACU  Anesthesia Type:General  Level of Consciousness: awake, alert , oriented and patient cooperative  Airway & Oxygen Therapy: Patient Spontanous Breathing and Patient connected to face mask oxygen  Post-op Assessment: Report given to RN, Post -op Vital signs reviewed and stable and Patient moving all extremities X 4  Post vital signs: Reviewed and stable  Last Vitals:  Filed Vitals:   08/15/15 1207 08/15/15 1546  BP: 120/70 101/60  Pulse: 85 70  Temp: 36.5 C 36.6 C  Resp: 16 18    Last Pain: There were no vitals filed for this visit.       Complications: No apparent anesthesia complications

## 2015-08-15 NOTE — Anesthesia Preprocedure Evaluation (Addendum)
Anesthesia Evaluation  Patient identified by MRN, date of birth, ID band Patient awake    Reviewed: Allergy & Precautions, NPO status , Patient's Chart, lab work & pertinent test results, reviewed documented beta blocker date and time   Airway Mallampati: III  TM Distance: >3 FB     Dental  (+) Chipped, Poor Dentition, Dental Advisory Given, Missing   Pulmonary shortness of breath,           Cardiovascular hypertension, Pt. on medications and Pt. on home beta blockers + dysrhythmias      Neuro/Psych Anxiety    GI/Hepatic   Endo/Other    Renal/GU Renal InsufficiencyRenal disease     Musculoskeletal   Abdominal   Peds  Hematology   Anesthesia Other Findings Obese.  Reproductive/Obstetrics                            Anesthesia Physical Anesthesia Plan  ASA: III  Anesthesia Plan: MAC   Post-op Pain Management:    Induction:   Airway Management Planned:   Additional Equipment:   Intra-op Plan:   Post-operative Plan:   Informed Consent: I have reviewed the patients History and Physical, chart, labs and discussed the procedure including the risks, benefits and alternatives for the proposed anesthesia with the patient or authorized representative who has indicated his/her understanding and acceptance.     Plan Discussed with: CRNA  Anesthesia Plan Comments:         Anesthesia Quick Evaluation

## 2015-08-15 NOTE — Op Note (Signed)
Jerry Howe [id number]  [id number]  Preop WU:[id number] systolic HF Postop Dx same/ICD placement   Procedure: single chamber ICD implantation without intraoperative defibrillation threshold testing  Following the obtaining of informed consent the patient was brought to the electrophysiology laboratory in place of the fluoroscopic table in the supine position. After routine prep and drape, lidocaine was infiltrated in the prepectoral subclavicular region and an incision was made and carried down to the layer of the prepectoral fascia using electrocautery and sharp dissection. A pocket was formed similarly.  Thereafter an attention was turned to gain access to the extrathoracic left subclavian vein which was accomplished without without difficulty and without the aspiration of air or puncture of the artery. Unilateral venogram was performed for central venipuncture. A 9 French sheath was placed for which was then passed a single coil  active fixation defibrillator lead, model 6935M serial number [id number] passed under fluoroscopic guidance to the right ventricular apex. In its location the bipolar R wave was 14.0 mivolts, impedance was 532 ohms, the pacing threshold was 1.0volts at 0.4 msec.There was no diaphragmatic pacing at 10 V. The current of injury was brisk.  The lead was secured to the prepectoral fascia and then attached to a Medtronic  ICD, serial number VISIA AF MRI [id number]. High-voltage impedance was 67 ohms.  The pocket was copiously irrigated with antibiotic containing saline solution. Hemostasis was assured, and the device and the lead was placed in the pocket and secured to the prepectoral fascia.  The wound was closed in 3  layers in normal fashion.  benzoin Steri-Strips dressing was then applied. Needle counts sponge counts and instrument counts were correct at the end of the procedure according to the staff.   Patient tolerated the procedure without apparent complication  EBL   Minimal     Fluor 6:46     Daevion Navarette L., MD 08/15/2015 1:25 PM

## 2015-08-15 NOTE — Anesthesia Postprocedure Evaluation (Signed)
Anesthesia Post Note  Patient: Jerry Howe  Procedure(s) Performed: Procedure(s) (LRB): ICD IMPLANT, single chamber (Left)  Patient location during evaluation: PACU Anesthesia Type: General Level of consciousness: awake and alert Pain management: pain level controlled Vital Signs Assessment: post-procedure vital signs reviewed and stable Respiratory status: spontaneous breathing, nonlabored ventilation, respiratory function stable and patient connected to nasal cannula oxygen Cardiovascular status: blood pressure returned to baseline and stable Postop Assessment: no signs of nausea or vomiting Anesthetic complications: no    Last Vitals:  Filed Vitals:   08/15/15 1653 08/15/15 2000  BP: 118/66 114/60  Pulse: 59 61  Temp: 36.5 C 36.6 C  Resp: 12 19    Last Pain:  Filed Vitals:   08/15/15 2025  PainSc: 8                  Tiarah Shisler S

## 2015-08-16 ENCOUNTER — Observation Stay: Payer: BLUE CROSS/BLUE SHIELD

## 2015-08-16 DIAGNOSIS — I5022 Chronic systolic (congestive) heart failure: Secondary | ICD-10-CM | POA: Diagnosis not present

## 2015-08-16 MED ORDER — CEPHALEXIN 250 MG PO CAPS: 250.0000 mg | ORAL_CAPSULE | Freq: Four times a day (QID) | ORAL | Status: DC

## 2015-08-16 NOTE — Progress Notes (Signed)
Pt discharged to home via wc.  Instructions and rx given to pt.  Questions answered.  No distress.  

## 2015-08-16 NOTE — Final Progress Note (Signed)
Physician Final Progress Note  Patient ID: SHADE MCCALIP MRN: [id number] DOB/AGE: 09/15/1955 60 y.o.  Admit date: 08/15/2015 Admitting provider: Sharion Settler, MD Discharge date: 08/16/2015   Admission Diagnoses: Chronic systolic congestive heart failure  Discharge Diagnoses:  Active Problems:   Chronic systolic heart failure (HCC)  Nonischemic dilated cardiomyopathy  Consults: None  Significant Findings/ Diagnostic Studies: radiology: CXR: normal  Procedures: Single chamber ICD  Discharge Condition: good  Disposition: 01-Home or Self Care  Diet: Cardiac diet  Discharge Activity: Do not lift her left arm above head     Medication List    TAKE these medications        aspirin 81 MG EC tablet  Take 1 tablet (81 mg total) by mouth daily.     b complex vitamins tablet  Take 1 tablet by mouth daily.     cephALEXin 250 MG capsule  Commonly known as:  KEFLEX  Take 1 capsule (250 mg total) by mouth 4 (four) times daily.     lisinopril 20 MG tablet  Commonly known as:  PRINIVIL,ZESTRIL  Take 20 mg by mouth daily.     metoprolol 50 MG tablet  Commonly known as:  LOPRESSOR  Take 1 tablet (50 mg total) by mouth 2 (two) times daily.     multivitamin with minerals Tabs tablet  Take 1 tablet by mouth daily.     spironolactone 25 MG tablet  Commonly known as:  ALDACTONE  Take 25 mg by mouth daily.     VITAMIN B 12 PO  Take 1 tablet by mouth daily.           Follow-up Information    Follow up with Kamyla Olejnik, MD In 1 week.   Specialty:  Cardiology   Contact information:   [address] [facility] [place] [address] [phone]       Total time spent taking care of this patient: 25 minutes  Signed: Lonny Eisen 08/16/2015, 9:34 AM

## 2015-08-16 NOTE — Progress Notes (Addendum)
To xray via bed 

## 2015-08-16 NOTE — Discharge Instructions (Signed)
Do not lift left arm above head. May shower 08/17/2015.

## 2015-08-16 NOTE — Progress Notes (Signed)
Back from xra

## 2015-08-17 ENCOUNTER — Encounter: Payer: Self-pay | Admitting: Cardiology

## 2016-01-01 ENCOUNTER — Encounter: Payer: Self-pay | Admitting: Family Medicine

## 2016-01-01 NOTE — Telephone Encounter (Deleted)
Patient has one touch ultra test strips that we sent in, However he now needs the meter stent to [facility].

## 2016-01-01 NOTE — Telephone Encounter (Deleted)
Test strips were ordered for pt but he was given one tough ultra strips but he doesn't have a one tough meter.  His call back number is [phone]

## 2016-01-01 NOTE — Telephone Encounter (Deleted)
I am not familiar with this patient and there is no [facility] documentation on his chart- please check allscripts before we proceed. I don't see where a meter was sent in.

## 2016-01-02 NOTE — Telephone Encounter (Deleted)
This encounter was created in error - please disregard.

## 2016-01-02 NOTE — Telephone Encounter (Signed)
Erroneous encounter - please disregard.

## 2016-01-26 ENCOUNTER — Emergency Department: Payer: No Typology Code available for payment source

## 2016-01-26 ENCOUNTER — Encounter: Payer: Self-pay | Admitting: Emergency Medicine

## 2016-01-26 ENCOUNTER — Emergency Department
Admission: EM | Admit: 2016-01-26 | Discharge: 2016-01-26 | Disposition: A | Discharge status: Home / Self Care | Payer: No Typology Code available for payment source | Attending: Emergency Medicine | Admitting: Emergency Medicine

## 2016-01-26 DIAGNOSIS — I110 Hypertensive heart disease with heart failure: Secondary | ICD-10-CM | POA: Insufficient documentation

## 2016-01-26 DIAGNOSIS — Z79899 Other long term (current) drug therapy: Secondary | ICD-10-CM | POA: Insufficient documentation

## 2016-01-26 DIAGNOSIS — I5022 Chronic systolic (congestive) heart failure: Secondary | ICD-10-CM | POA: Insufficient documentation

## 2016-01-26 DIAGNOSIS — H8110 Benign paroxysmal vertigo, unspecified ear: Secondary | ICD-10-CM

## 2016-01-26 LAB — BASIC METABOLIC PANEL
ANION GAP: 6 (ref 5–15)
BUN: 17 mg/dL (ref 6–20)
CALCIUM: 9 mg/dL (ref 8.9–10.3)
CO2: 27 mmol/L (ref 22–32)
Chloride: 106 mmol/L (ref 101–111)
Creatinine, Ser: 1.48 mg/dL — ABNORMAL HIGH (ref 0.61–1.24)
GFR calc Af Amer: 58 mL/min — ABNORMAL LOW (ref >60)
GFR, EST NON AFRICAN AMERICAN: 50 mL/min — AB (ref >60)
GLUCOSE: 120 mg/dL — AB (ref 65–99)
Potassium: 3.9 mmol/L (ref 3.5–5.1)
Sodium: 139 mmol/L (ref 135–145)

## 2016-01-26 LAB — CBC
HCT: 44.1 % (ref 40.0–52.0)
HEMOGLOBIN: 14.8 g/dL (ref 13.0–18.0)
MCH: 28.1 pg (ref 26.0–34.0)
MCHC: 33.6 g/dL (ref 32.0–36.0)
MCV: 83.4 fL (ref 80.0–100.0)
Platelets: 158 10*3/uL (ref 150–440)
RBC: 5.29 MIL/uL (ref 4.40–5.90)
RDW: 13.7 % (ref 11.5–14.5)
WBC: 8.9 10*3/uL (ref 3.8–10.6)

## 2016-01-26 LAB — TROPONIN I: TROPONIN I: 0.03 ng/mL — AB (ref <0.03)

## 2016-01-26 MED ORDER — LORAZEPAM 1 MG PO TABS: 1.0000 mg | ORAL_TABLET | Freq: Three times a day (TID) | ORAL | 0 refills | Status: DC | PRN

## 2016-01-26 MED ORDER — MECLIZINE HCL 25 MG PO TABS
ORAL_TABLET | ORAL | Status: AC
  Filled 2016-01-26: qty 2

## 2016-01-26 MED ORDER — MECLIZINE HCL 25 MG PO TABS
50.0000 mg | ORAL_TABLET | Freq: Once | ORAL | Status: AC
  Administered 2016-01-26: 50 mg via ORAL

## 2016-01-26 MED ORDER — LORAZEPAM 1 MG PO TABS
1.0000 mg | ORAL_TABLET | Freq: Once | ORAL | Status: AC
  Administered 2016-01-26: 1 mg via ORAL
  Filled 2016-01-26: qty 1

## 2016-01-26 NOTE — ED Triage Notes (Addendum)
Pt presents to ED with reports of dizziness that began yesterday. Pt reports dizziness gets worse when he moves around. Pt reports some nausea but denies vomiting. Pt reports bilateral ear pain. Pt reports headache after the dizziness started. Stroke screen negative. Pt wife states she gave him a meclizine, pt states dizziness became worse after taking it. Pt alert and oriented in triage.

## 2016-01-26 NOTE — ED Notes (Signed)
Pt. Verbalizes understanding of d/c instructions, prescriptions, and follow-up. VS stable and pt. Denies pain.  Pt. In NAD at time of d/c and denies further concerns regarding this visit. Pt. Stable at the time of departure from the unit, departing unit by the safest and most appropriate manner per that pt condition and limitations. Pt advised to return to the ED at any time for emergent concerns, or for new/worsening symptoms.

## 2016-01-26 NOTE — ED Provider Notes (Signed)
[facility] Emergency Department Provider Note  ____________________________________________  Time seen: Approximately 9:10 PM  I have reviewed the triage vital signs and the nursing notes.   HISTORY  Chief Complaint Dizziness and Nausea    HPI Jerry Howe is a 60 y.o. male who complains of intermittent dizziness described as whole room spinning and in motion that started yesterday. Worse when he turns his head and changes position going from sitting to standing or lying down from sitting. Has some nausea but no vomiting. Has some occasional bilateral frontal and vague headache, but nothing sudden or thunderclap or focal. No peripheral weakness. No falls or trauma. No recent illness. No chest pain. Tried 25 mg meclizine without relief     Past Medical History:  Diagnosis Date  . Anxiety   . Arrhythmia   . Hypertension   . Kidney stones   . Shortness of breath dyspnea      Patient Active Problem List   Diagnosis Date Noted  . Chronic systolic heart failure (HCC) 08/15/2015  . Chest pain, central 11/19/2014     Past Surgical History:  Procedure Laterality Date  . CARDIAC CATHETERIZATION Right 11/20/2014   Procedure: Left Heart Cath and Coronary Angiography;  Surgeon: Marcina Millard, MD;  Location: [facility] INVASIVE CV LAB;  Service: Cardiovascular;  Laterality: Right;  . IMPLANTABLE CARDIOVERTER DEFIBRILLATOR (ICD) GENERATOR CHANGE Left 08/15/2015   Procedure: ICD IMPLANT, single chamber;  Surgeon: Sharion Settler, MD;  Location: [facility] ORS;  Service: Cardiovascular;  Laterality: Left;  . KNEE SURGERY       Prior to Admission medications   Medication Sig Start Date End Date Taking? Authorizing Provider  albuterol (PROVENTIL HFA;VENTOLIN HFA) 108 (90 Base) MCG/ACT inhaler Inhale 1 puff into the lungs every 6 (six) hours as needed for wheezing or shortness of breath.   Yes Historical Provider, MD  Ascorbic Acid (VITAMIN C) 1000 MG tablet Take 1,000  mg by mouth daily.   Yes Historical Provider, MD  b complex vitamins tablet Take 1 tablet by mouth daily.   Yes Historical Provider, MD  Cinnamon 500 MG TABS Take 4 tablets by mouth daily.   Yes Historical Provider, MD  lisinopril (PRINIVIL,ZESTRIL) 5 MG tablet Take 5 mg by mouth daily.    Yes Historical Provider, MD  meclizine (ANTIVERT) 25 MG tablet Take 25 mg by mouth 3 (three) times daily as needed for dizziness.   Yes Historical Provider, MD  metoprolol (LOPRESSOR) 50 MG tablet Take 1 tablet (50 mg total) by mouth 2 (two) times daily. 11/20/14  Yes Auburn Bilberry, MD  LORazepam (ATIVAN) 1 MG tablet Take 1 tablet (1 mg total) by mouth every 8 (eight) hours as needed for anxiety. 01/26/16 01/25/17  Sharman Cheek, MD     Allergies Shellfish allergy   Family History  Problem Relation Age of Onset  . Heart failure Neg Hx     Social History Social History  Substance Use Topics  . Smoking status: Never Smoker  . Smokeless tobacco: Not on file  . Alcohol use No    Review of Systems  Constitutional:   No fever or chills.  ENT:   No sore throat. No rhinorrhea. Cardiovascular:   No chest pain. Respiratory:   No dyspnea or cough. Gastrointestinal:   Negative for abdominal pain, vomiting and diarrhea.   Neurological:   Occasional mild headache 10-point ROS otherwise negative.  ____________________________________________   PHYSICAL EXAM:  VITAL SIGNS: ED Triage Vitals [01/26/16 1647]  Enc Vitals Group  BP 139/75     Pulse Rate 67     Resp 18     Temp 98.7 F (37.1 C)     Temp Source Oral     SpO2 100 %     Weight 244 lb (110.7 kg)     Height 5\' 9"  (1.753 m)     Head Circumference      Peak Flow      Pain Score      Pain Loc      Pain Edu?      Excl. in GC?     Vital signs reviewed, nursing assessments reviewed.   Constitutional:   Alert and oriented. Well appearing and in no distress. Eyes:   No scleral icterus. No conjunctival pallor. PERRL. EOMI.  No  nystagmus. ENT   Head:   Normocephalic and atraumatic.Left TM injected, right TM normal   Nose:   No congestion/rhinnorhea. No septal hematoma   Mouth/Throat:   MMM, no pharyngeal erythema. No peritonsillar mass.    Neck:   No stridor. No SubQ emphysema. No meningismus. Hematological/Lymphatic/Immunilogical:   No cervical lymphadenopathy. Cardiovascular:   RRR. Symmetric bilateral radial and DP pulses.  No murmurs.  Respiratory:   Normal respiratory effort without tachypnea nor retractions. Breath sounds are clear and equal bilaterally. No wheezes/rales/rhonchi. Gastrointestinal:   Soft and nontender. Non distended. There is no CVA tenderness.  No rebound, rigidity, or guarding. Genitourinary:   deferred Musculoskeletal:   Nontender with normal range of motion in all extremities. No joint effusions.  No lower extremity tenderness.  No edema. Neurologic:   Normal speech and language.  CN 2-10 normal. Motor grossly intact. No gross focal neurologic deficits are appreciated.  Skin:    Skin is warm, dry and intact. No rash noted.  No petechiae, purpura, or bullae.  ____________________________________________    LABS (pertinent positives/negatives) (all labs ordered are listed, but only abnormal results are displayed) Labs Reviewed  BASIC METABOLIC PANEL - Abnormal; Notable for the following:       Result Value   Glucose, Bld 120 (*)    Creatinine, Ser 1.48 (*)    GFR calc non Af Amer 50 (*)    GFR calc Af Amer 58 (*)    All other components within normal limits  TROPONIN I - Abnormal; Notable for the following:    Troponin I 0.03 (*)    All other components within normal limits  CBC  URINALYSIS COMPLETEWITH MICROSCOPIC ([facility] ONLY)   ____________________________________________   EKG  Interpreted by me Normal sinus rhythm rate of 66, normal axis intervals QRS ST segments. T wave inversions in lateral leads which are unchanged compared to  11/21/2014  ____________________________________________    RADIOLOGY  CT head unremarkable  ____________________________________________   PROCEDURES Procedures  ____________________________________________   INITIAL IMPRESSION / ASSESSMENT AND PLAN / ED COURSE  Pertinent labs & imaging results that were available during my care of the patient were reviewed by me and considered in my medical decision making (see chart for details).  Patient well appearing no acute distress. Presents with symptoms classic for benign positional vertigo. Because of this clear presentation based on history and physical, I did not perform a Dix-Hallpike as this would just unnecessarily worsen his symptoms. He is given 50 mg of meclizine which did help in the ED, but he had incomplete relief so I will also give him an Ativan tablet and prescription for Ativan. CT head negative, follow-up with primary care. If symptoms are not  resolved in a week follow up with ENT.Considering the patient's symptoms, medical history, and physical examination today, I have low suspicion for ischemic stroke, intracranial hemorrhage, meningitis, encephalitis, carotid or vertebral dissection, venous sinus thrombosis, MS, intracranial hypertension, glaucoma, CRAO, CRVO, or temporal arteritis.      Clinical Course   ____________________________________________   FINAL CLINICAL IMPRESSION(S) / ED DIAGNOSES  Final diagnoses:  Benign paroxysmal positional vertigo, unspecified laterality       Portions of this note were generated with dragon dictation software. Dictation errors may occur despite best attempts at proofreading.    Sharman Cheek, MD 01/26/16 2113

## 2016-01-26 NOTE — ED Notes (Signed)
Pt to CT now

## 2016-12-09 ENCOUNTER — Encounter: Payer: Self-pay | Admitting: Family

## 2016-12-09 ENCOUNTER — Ambulatory Visit: Payer: Self-pay | Attending: Family | Admitting: Family

## 2016-12-09 ENCOUNTER — Telehealth: Payer: Self-pay | Admitting: Family

## 2016-12-09 VITALS — BP 140/87 | HR 78 | Resp 18 | Ht 69.0 in | Wt 238.1 lb

## 2016-12-09 DIAGNOSIS — I10 Essential (primary) hypertension: Secondary | ICD-10-CM | POA: Insufficient documentation

## 2016-12-09 DIAGNOSIS — F419 Anxiety disorder, unspecified: Secondary | ICD-10-CM | POA: Insufficient documentation

## 2016-12-09 DIAGNOSIS — Z91013 Allergy to seafood: Secondary | ICD-10-CM | POA: Insufficient documentation

## 2016-12-09 DIAGNOSIS — I110 Hypertensive heart disease with heart failure: Secondary | ICD-10-CM | POA: Insufficient documentation

## 2016-12-09 DIAGNOSIS — Z95810 Presence of automatic (implantable) cardiac defibrillator: Secondary | ICD-10-CM | POA: Insufficient documentation

## 2016-12-09 DIAGNOSIS — I5022 Chronic systolic (congestive) heart failure: Secondary | ICD-10-CM | POA: Insufficient documentation

## 2016-12-09 DIAGNOSIS — Z7982 Long term (current) use of aspirin: Secondary | ICD-10-CM | POA: Insufficient documentation

## 2016-12-09 DIAGNOSIS — I429 Cardiomyopathy, unspecified: Secondary | ICD-10-CM | POA: Insufficient documentation

## 2016-12-09 DIAGNOSIS — Z87442 Personal history of urinary calculi: Secondary | ICD-10-CM | POA: Insufficient documentation

## 2016-12-09 DIAGNOSIS — Z79899 Other long term (current) drug therapy: Secondary | ICD-10-CM | POA: Insufficient documentation

## 2016-12-09 LAB — BASIC METABOLIC PANEL
ANION GAP: 6 (ref 5–15)
BUN: 17 mg/dL (ref 6–20)
CALCIUM: 9.3 mg/dL (ref 8.9–10.3)
CO2: 26 mmol/L (ref 22–32)
Chloride: 108 mmol/L (ref 101–111)
Creatinine, Ser: 1.37 mg/dL — ABNORMAL HIGH (ref 0.61–1.24)
GFR calc Af Amer: >60 mL/min (ref >60)
GFR, EST NON AFRICAN AMERICAN: 55 mL/min — AB (ref >60)
GLUCOSE: 109 mg/dL — AB (ref 65–99)
Potassium: 4.1 mmol/L (ref 3.5–5.1)
Sodium: 140 mmol/L (ref 135–145)

## 2016-12-09 MED ORDER — SACUBITRIL-VALSARTAN 24-26 MG PO TABS: 1.0000 | ORAL_TABLET | Freq: Two times a day (BID) | ORAL | 3 refills | Status: DC

## 2016-12-09 NOTE — Patient Instructions (Addendum)
Continue weighing daily and call for an overnight weight gain of > 2 pounds or a weekly weight gain of >5 pounds.  Drink no more than 60 ounces of fluid daily   Low-Sodium Eating Plan Sodium, which is an element that makes up salt, helps you maintain a healthy balance of fluids in your body. Too much sodium can increase your blood pressure and cause fluid and waste to be held in your body. Your health care provider or dietitian may recommend following this plan if you have high blood pressure (hypertension), kidney disease, liver disease, or heart failure. Eating less sodium can help lower your blood pressure, reduce swelling, and protect your heart, liver, and kidneys. What are tips for following this plan? General guidelines  Most people on this plan should limit their sodium intake to 1,500-2,000 mg (milligrams) of sodium each day. Reading food labels  The Nutrition Facts label lists the amount of sodium in one serving of the food. If you eat more than one serving, you must multiply the listed amount of sodium by the number of servings.  Choose foods with less than 140 mg of sodium per serving.  Avoid foods with 300 mg of sodium or more per serving. Shopping  Look for lower-sodium products, often labeled as "low-sodium" or "no salt added."  Always check the sodium content even if foods are labeled as "unsalted" or "no salt added".  Buy fresh foods. ? Avoid canned foods and premade or frozen meals. ? Avoid canned, cured, or processed meats  Buy breads that have less than 80 mg of sodium per slice. Cooking  Eat more home-cooked food and less restaurant, buffet, and fast food.  Avoid adding salt when cooking. Use salt-free seasonings or herbs instead of table salt or sea salt. Check with your health care provider or pharmacist before using salt substitutes.  Cook with plant-based oils, such as canola, sunflower, or olive oil. Meal planning  When eating at a restaurant, ask that  your food be prepared with less salt or no salt, if possible.  Avoid foods that contain MSG (monosodium glutamate). MSG is sometimes added to [place] food, bouillon, and some canned foods. What foods are recommended? The items listed may not be a complete list. Talk with your dietitian about what dietary choices are best for you. Grains Low-sodium cereals, including oats, puffed wheat and rice, and shredded wheat. Low-sodium crackers. Unsalted rice. Unsalted pasta. Low-sodium bread. Whole-grain breads and whole-grain pasta. Vegetables Fresh or frozen vegetables. "No salt added" canned vegetables. "No salt added" tomato sauce and paste. Low-sodium or reduced-sodium tomato and vegetable juice. Fruits Fresh, frozen, or canned fruit. Fruit juice. Meats and other protein foods Fresh or frozen (no salt added) meat, poultry, seafood, and fish. Low-sodium canned tuna and salmon. Unsalted nuts. Dried peas, beans, and lentils without added salt. Unsalted canned beans. Eggs. Unsalted nut butters. Dairy Milk. Soy milk. Cheese that is naturally low in sodium, such as ricotta cheese, fresh mozzarella, or Swiss cheese Low-sodium or reduced-sodium cheese. Cream cheese. Yogurt. Fats and oils Unsalted butter. Unsalted margarine with no trans fat. Vegetable oils such as canola or olive oils. Seasonings and other foods Fresh and dried herbs and spices. Salt-free seasonings. Low-sodium mustard and ketchup. Sodium-free salad dressing. Sodium-free light mayonnaise. Fresh or refrigerated horseradish. Lemon juice. Vinegar. Homemade, reduced-sodium, or low-sodium soups. Unsalted popcorn and pretzels. Low-salt or salt-free chips. What foods are not recommended? The items listed may not be a complete list. Talk with your dietitian about what  dietary choices are best for you. Grains Instant hot cereals. Bread stuffing, pancake, and biscuit mixes. Croutons. Seasoned rice or pasta mixes. Noodle soup cups. Boxed or frozen  macaroni and cheese. Regular salted crackers. Self-rising flour. Vegetables Sauerkraut, pickled vegetables, and relishes. Olives. [place] fries. Onion rings. Regular canned vegetables (not low-sodium or reduced-sodium). Regular canned tomato sauce and paste (not low-sodium or reduced-sodium). Regular tomato and vegetable juice (not low-sodium or reduced-sodium). Frozen vegetables in sauces. Meats and other protein foods Meat or fish that is salted, canned, smoked, spiced, or pickled. Bacon, ham, sausage, hotdogs, corned beef, chipped beef, packaged lunch meats, salt pork, jerky, pickled herring, anchovies, regular canned tuna, sardines, salted nuts. Dairy Processed cheese and cheese spreads. Cheese curds. Blue cheese. Feta cheese. String cheese. Regular cottage cheese. Buttermilk. Canned milk. Fats and oils Salted butter. Regular margarine. Ghee. Bacon fat. Seasonings and other foods Onion salt, garlic salt, seasoned salt, table salt, and sea salt. Canned and packaged gravies. Worcestershire sauce. Tartar sauce. Barbecue sauce. Teriyaki sauce. Soy sauce, including reduced-sodium. Steak sauce. Fish sauce. Oyster sauce. Cocktail sauce. Horseradish that you find on the shelf. Regular ketchup and mustard. Meat flavorings and tenderizers. Bouillon cubes. Hot sauce and Tabasco sauce. Premade or packaged marinades. Premade or packaged taco seasonings. Relishes. Regular salad dressings. Salsa. Potato and tortilla chips. Corn chips and puffs. Salted popcorn and pretzels. Canned or dried soups. Pizza. Frozen entrees and pot pies. Summary  Eating less sodium can help lower your blood pressure, reduce swelling, and protect your heart, liver, and kidneys.  Most people on this plan should limit their sodium intake to 1,500-2,000 mg (milligrams) of sodium each day.  Canned, boxed, and frozen foods are high in sodium. Restaurant foods, fast foods, and pizza are also very high in sodium. You also get sodium by adding  salt to food.  Try to cook at home, eat more fresh fruits and vegetables, and eat less fast food, canned, processed, or prepared foods. This information is not intended to replace advice given to you by your health care provider. Make sure you discuss any questions you have with your health care provider. Document Released: 09/25/2001 Document Revised: 03/29/2016 Document Reviewed: 03/29/2016 Elsevier Interactive Patient Education  2017 [employer].

## 2016-12-09 NOTE — Progress Notes (Signed)
Patient ID: Jerry Howe, male    DOB: 11-09-1955, 60 y.o.   MRN: [id number]  HPI  Jerry Howe is a 61 y/o male with a history of kidney stones, HTN, arrhthymia and chronic heart failure.   Echo from 06/11/15 reviewed and shows an EF of 25% along with mild Jerry and trivial TR. Cardiac catheterization done 11/20/14 and showed severe cardiomyopathy with an EF of 17%. 20% stenosis in prox RCA and prox LAD.   Has not been admitted or in the ED in the last 6 months.  He presents today for his initial visit with a chief complaint of mild shortness of breath upon moderate exertion. He describes this as having been present for the last year with improvement over the last few weeks. He has associated fatigue, light-headedness and difficulty sleeping along with this. He denies any chest pain, edema, palpitations or weight gain. Has had to decrease his work hours from 40 hours/week down to 15 hours/week because he gets short of breath. 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   Past Medical History:  Diagnosis Date  . Anxiety   . Arrhythmia   . CHF (congestive heart failure) (HCC)   . Hypertension   . Kidney stones   . Shortness of breath dyspnea    Past Surgical History:  Procedure Laterality Date  . CARDIAC CATHETERIZATION Right 11/20/2014   Procedure: Left Heart Cath and Coronary Angiography;  Surgeon: Marcina Millard, MD;  Location: [facility] INVASIVE CV LAB;  Service: Cardiovascular;  Laterality: Right;  . IMPLANTABLE CARDIOVERTER DEFIBRILLATOR (ICD) GENERATOR CHANGE Left 08/15/2015   Procedure: ICD IMPLANT, single chamber;  Surgeon: Sharion Settler, MD;  Location: [facility] ORS;  Service: Cardiovascular;  Laterality: Left;  . KNEE SURGERY     Family History  Problem Relation Age of Onset  . Heart failure Neg Hx    Social History  Substance Use Topics  . Smoking status: Never Smoker  . Smokeless tobacco: Never Used  . Alcohol use No   Allergies  Allergen Reactions  . Shellfish Allergy Anaphylaxis    Prior to Admission medications   Medication Sig Start Date End Date Taking? Authorizing Provider  albuterol (PROVENTIL HFA;VENTOLIN HFA) 108 (90 Base) MCG/ACT inhaler Inhale 1 puff into the lungs every 6 (six) hours as needed for wheezing or shortness of breath.   Yes [provider]  aspirin EC 81 MG tablet Take 81 mg by mouth daily.   Yes [provider]  cyanocobalamin 2000 MCG tablet Take 2,000 mcg by mouth daily.   Yes [provider]  lisinopril (PRINIVIL,ZESTRIL) 5 MG tablet Take 5 mg by mouth daily.    Yes [provider]  lovastatin (MEVACOR) 20 MG tablet Take 20 mg by mouth at bedtime.   Yes [provider]  metoprolol (LOPRESSOR) 50 MG tablet Take 1 tablet (50 mg total) by mouth 2 (two) times daily. 11/20/14  Yes Auburn Bilberry, MD  Multiple Vitamins-Minerals (SUPER THERA VITE M PO) Take 1 tablet by mouth daily.   Yes [provider]  pyridoxine (B-6) 500 MG tablet Take 500 mg by mouth daily.   Yes [provider]    Review of Systems  Constitutional: Positive for fatigue. Negative for appetite change.  HENT: Negative for congestion, postnasal drip and sore throat.   Eyes: Negative.   Respiratory: Positive for shortness of breath. Negative for cough and chest tightness.   Cardiovascular: Negative for chest pain, palpitations and leg swelling.  Gastrointestinal: Negative for abdominal distention and  abdominal pain.  Endocrine: Negative.   Genitourinary: Negative.   Musculoskeletal: Negative for back pain and neck pain.  Skin: Negative.   Allergic/Immunologic: Negative.   Neurological: Positive for light-headedness (at times). Negative for dizziness.  Hematological: Negative for adenopathy. Does not bruise/bleed easily.  Psychiatric/Behavioral: Positive for sleep disturbance (restless sleep due to breathing; sleeping on 2 pillows). Negative for dysphoric mood. The patient is not nervous/anxious.    Vitals:    12/09/16 0828  BP: 140/87  Pulse: 78  Resp: 18  SpO2: 100%  Weight: 238 lb 2 oz (108 kg)  Height: 5\' 9"  (1.753 m)   Wt Readings from Last 3 Encounters:  12/09/16 238 lb 2 oz (108 kg)  01/26/16 244 lb (110.7 kg)  08/15/15 236 lb 11.2 oz (107.4 kg)   Lab Results  Component Value Date   CREATININE 1.37 (H) 12/09/2016   CREATININE 1.48 (H) 01/26/2016   CREATININE 1.31 (H) 08/04/2015   Physical Exam  Constitutional: He is oriented to person, place, and time. He appears well-developed and well-nourished.  HENT:  Head: Normocephalic and atraumatic.  Neck: Normal range of motion. Neck supple. No JVD present.  Cardiovascular: Normal rate and regular rhythm.   Pulmonary/Chest: Effort normal. He has no wheezes. He has no rales.  Abdominal: Soft. He exhibits no distension. There is no tenderness.  Musculoskeletal: He exhibits no edema or tenderness.  Neurological: He is alert and oriented to person, place, and time.  Skin: Skin is warm and dry.  Psychiatric: He has a normal mood and affect. His behavior is normal. Thought content normal.  Nursing note and vitals reviewed.    Assessment & Plan:  1: Chronic heart failure with reduced ejection fraction- - NYHA class II - euvolemic today - already weighing daily; instructed to call for an overnight weight gain of >2 pounds or a weekly weight gain of >5 pounds - has been using salt substitute and occasionally regular salt. Reviewed how to read food labels and the importance of closely following a 2000mg  sodium diet. Written dietary information was given to him about this. - echo ordered as last one done February 2017 - patient has not taken lisinopril yet today so he was instructed to not take it today and then tomorrow begin entresto 24/26mg  BID. Voucher given to patient to get the first 30 days of medication at no charge - check a BMP next visit to assess renal function on entresto - discussed switching his lopressor to metoprolol  succinate at his next visit - saw cardiologist (Jerry Howe) 11/22/16 - ICD placed 08/15/15 - PharmD went in and reviewed medications with the patient  2: HTN- - saw PCP (Jerry Howe) 10/28/15 and needs to get established with a new PCP - BMP from 01/26/16 reviewed and showed sodium 139, potassium 3.9 and GFR 58 - BMP checked today  Patient did not bring his medications nor a list. Each medication was verbally reviewed with the patient and he was encouraged to bring the bottles to every visit to confirm accuracy of list.  Return in 1 month or sooner for any questions/problems before then.

## 2016-12-09 NOTE — Telephone Encounter (Signed)
LM on voicemail regarding lab results that were obtained today (12/09/16). Potassium and renal function look good and he is to continue taking his medications as is.

## 2016-12-14 ENCOUNTER — Ambulatory Visit: Payer: Self-pay | Attending: Family

## 2016-12-16 ENCOUNTER — Other Ambulatory Visit: Payer: Self-pay | Admitting: Family

## 2016-12-16 DIAGNOSIS — I5022 Chronic systolic (congestive) heart failure: Secondary | ICD-10-CM

## 2016-12-28 ENCOUNTER — Ambulatory Visit
Admission: RE | Admit: 2016-12-28 | Discharge: 2016-12-28 | Disposition: A | Discharge status: Home / Self Care | Payer: Self-pay | Source: Ambulatory Visit | Attending: Family | Admitting: Family

## 2016-12-28 DIAGNOSIS — I5022 Chronic systolic (congestive) heart failure: Secondary | ICD-10-CM | POA: Insufficient documentation

## 2016-12-28 DIAGNOSIS — I313 Pericardial effusion (noninflammatory): Secondary | ICD-10-CM | POA: Insufficient documentation

## 2016-12-28 DIAGNOSIS — I080 Rheumatic disorders of both mitral and aortic valves: Secondary | ICD-10-CM | POA: Insufficient documentation

## 2016-12-28 DIAGNOSIS — I110 Hypertensive heart disease with heart failure: Secondary | ICD-10-CM | POA: Insufficient documentation

## 2016-12-28 NOTE — Progress Notes (Signed)
*  PRELIMINARY RESULTS* Echocardiogram 2D Echocardiogram has been performed.  Cristela Blue 12/28/2016, 10:15 AM

## 2017-01-10 ENCOUNTER — Telehealth: Payer: Self-pay | Admitting: Family

## 2017-01-10 ENCOUNTER — Ambulatory Visit: Payer: Self-pay | Admitting: Family

## 2017-01-10 NOTE — Telephone Encounter (Signed)
Patient did not show for his [facility] Failure [facility] appointment on 01/10/17. Will attempt to reschedule.  

## 2017-01-13 ENCOUNTER — Encounter: Payer: Self-pay | Admitting: Family

## 2017-01-13 ENCOUNTER — Ambulatory Visit: Payer: Self-pay | Attending: Family | Admitting: Family

## 2017-01-13 VITALS — BP 134/82 | HR 73 | Resp 18 | Ht 69.0 in | Wt 231.5 lb

## 2017-01-13 DIAGNOSIS — Z95810 Presence of automatic (implantable) cardiac defibrillator: Secondary | ICD-10-CM | POA: Insufficient documentation

## 2017-01-13 DIAGNOSIS — Z87442 Personal history of urinary calculi: Secondary | ICD-10-CM | POA: Insufficient documentation

## 2017-01-13 DIAGNOSIS — I5022 Chronic systolic (congestive) heart failure: Secondary | ICD-10-CM | POA: Insufficient documentation

## 2017-01-13 DIAGNOSIS — I110 Hypertensive heart disease with heart failure: Secondary | ICD-10-CM | POA: Insufficient documentation

## 2017-01-13 DIAGNOSIS — F419 Anxiety disorder, unspecified: Secondary | ICD-10-CM | POA: Insufficient documentation

## 2017-01-13 DIAGNOSIS — I10 Essential (primary) hypertension: Secondary | ICD-10-CM

## 2017-01-13 NOTE — Patient Instructions (Signed)
Continue weighing daily and call for an overnight weight gain of > 2 pounds or a weekly weight gain of >5 pounds. 

## 2017-01-13 NOTE — Progress Notes (Signed)
Agree with pharmacist note below.  Physical exam and ROS were done by myself.  Wanted to get labs today but patient would like to defer until his next visit because he currently is uninsured. Feels better since beginning entresto and samples were provided today so that he can resume taking it twice daily. Advised him that should he run out again to call our office so we can provide more samples until he gets his insurance in place.  Return here in 1 month or sooner for any questions/problems before then.  Clarisa Kindred, FNP HF Clinic at [facility]    Patient ID: Jerry Howe, male    DOB: 03/22/1956, 61 y.o.   MRN: [id number]  HPI  Jerry Howe is a 61 y/o male with a history of kidney stones, HTN, arrhthymia and chronic heart failure.   Echo from 12/28/16 reviewed and shows an EF of 20-25% along with mild AR, moderate Jerry and moderately increased PA pressure of 40-45 mm Hg. Echo from 06/11/15 reviewed and shows an EF of 25% along with mild Jerry and trivial TR. Cardiac catheterization done 11/20/14 and showed severe cardiomyopathy with an EF of 17%. 20% stenosis in prox RCA and prox LAD.   Has not been admitted or in the ED in the last 6 months.  He presents today for his follow up visit with a chief complaint of shortness of breath upon moderate exertion. He describes this as having been present for the last year with improvement over the last few weeks. He denies any chest pain or edema. Has been eating healthier (i.e.: baked foods instead of fried and cut back sodas) and has dropped 7 pounds since last visit. [name] he has an episode of shortness of breath at work where he had to sit down for a while before he was able to stand up and walk again.  Past Medical History:  Diagnosis Date  . Anxiety   . Arrhythmia   . CHF (congestive heart failure) (HCC)   . Hypertension   . Kidney stones   . Shortness of breath dyspnea    Past Surgical History:  Procedure Laterality Date  . CARDIAC CATHETERIZATION  Right 11/20/2014   Procedure: Left Heart Cath and Coronary Angiography;  Surgeon: Marcina Millard, MD;  Location: [facility] INVASIVE CV LAB;  Service: Cardiovascular;  Laterality: Right;  . IMPLANTABLE CARDIOVERTER DEFIBRILLATOR (ICD) GENERATOR CHANGE Left 08/15/2015   Procedure: ICD IMPLANT, single chamber;  Surgeon: Sharion Settler, MD;  Location: [facility] ORS;  Service: Cardiovascular;  Laterality: Left;  . KNEE SURGERY     Family History  Problem Relation Age of Onset  . Heart failure Neg Hx    Social History  Substance Use Topics  . Smoking status: Never Smoker  . Smokeless tobacco: Never Used  . Alcohol use No   Allergies  Allergen Reactions  . Shellfish Allergy Anaphylaxis   Prior to Admission medications   Medication Sig Start Date End Date Taking? Authorizing Provider  albuterol (PROVENTIL HFA;VENTOLIN HFA) 108 (90 Base) MCG/ACT inhaler Inhale 1 puff into the lungs every 6 (six) hours as needed for wheezing or shortness of breath.   Yes [provider]  aspirin EC 81 MG tablet Take 81 mg by mouth daily.   Yes [provider]  cyanocobalamin 2000 MCG tablet Take 2,000 mcg by mouth daily.   Yes [provider]  lovastatin (MEVACOR) 20 MG tablet Take 20 mg by mouth at bedtime.   Yes [provider]  metoprolol (LOPRESSOR)  50 MG tablet Take 1 tablet (50 mg total) by mouth 2 (two) times daily. 11/20/14  Yes Auburn Bilberry, MD  Multiple Vitamins-Minerals (SUPER THERA VITE M PO) Take 1 tablet by mouth daily.   Yes [provider]  pyridoxine (B-6) 500 MG tablet Take 500 mg by mouth daily.   Yes [provider]  sacubitril-valsartan (ENTRESTO) 24-26 MG Take 1 tablet by mouth 2 (two) times daily. 12/09/16  Yes Delma Freeze, FNP    Review of Systems  Constitutional: Positive for fatigue. Negative for appetite change.  HENT: Negative for congestion, postnasal drip and sore throat.   Eyes: Negative.   Respiratory: Positive for shortness  of breath (with exertion). Negative for cough and chest tightness.   Cardiovascular: Negative for chest pain, palpitations and leg swelling.  Gastrointestinal: Negative for abdominal distention and abdominal pain.  Endocrine: Negative.   Genitourinary: Negative.   Musculoskeletal: Negative for back pain and neck pain.  Skin: Negative.   Allergic/Immunologic: Negative.   Neurological: Negative for dizziness and light-headedness.  Hematological: Negative for adenopathy. Does not bruise/bleed easily.  Psychiatric/Behavioral: Negative for dysphoric mood and sleep disturbance (sleeping on 2 pillows). The patient is not nervous/anxious.    Vitals:   01/13/17 0943  BP: 134/82  Pulse: 73  Resp: 18  SpO2: 100%  Weight: 231 lb 8 oz (105 kg)  Height:  (1.753 m)   Wt Readings from Last 3 Encounters:  01/13/17 231 lb 8 oz (105 kg)  12/09/16 238 lb 2 oz (108 kg)  01/26/16 244 lb (110.7 kg)   Lab Results  Component Value Date   CREATININE 1.37 (H) 12/09/2016   CREATININE 1.48 (H) 01/26/2016   CREATININE 1.31 (H) 08/04/2015   Physical Exam  Constitutional: He is oriented to person, place, and time. He appears well-developed and well-nourished.  HENT:  Head: Normocephalic and atraumatic.  Neck: Normal range of motion. Neck supple. No JVD present.  Cardiovascular: Normal rate and regular rhythm.   Pulmonary/Chest: Effort normal. He has no wheezes. He has no rales.  Abdominal: Soft. He exhibits no distension. There is no tenderness.  Musculoskeletal: He exhibits no edema or tenderness.  Neurological: He is alert and oriented to person, place, and time.  Skin: Skin is warm and dry.  Psychiatric: He has a normal mood and affect. His behavior is normal. Thought content normal.  Nursing note and vitals reviewed.    Assessment & Plan:  1: Chronic heart failure with reduced ejection fraction- - NYHA class II - euvolemic today  - already weighing daily; instructed to call for an  overnight weight gain of >2 pounds or a weekly weight gain of >5 pounds - has been using salt substitute and occasionally regular salt; Overall making healthier choices such as baked foods over fried and limiting soda and increasing fruits/veggies; Has lost 7 pounds since last appointment (11/2016)  - Voucher was given to patient last visit for Entresto 24/26 to get the first 30 days of medication at no charge - States he has been taking Entresto only once a day because he cannot afford to get more because he does not have insurance. [name] he will get insurance next month. Supplied him with more samples of Entresto 24/26 to hold him over through the month until he gets insurance. (#56; LOT [id number] EXP Sep 2020)  - Spironolactone was stopped over a year ago because of dizziness  - check a BMP next month due to not having insurance  - discussed switching  his lopressor to metoprolol succinate at next visit when he has insurance  - saw cardiologist (Paraschos) 11/22/16 - ICD placed 08/15/15 and has not had it fire  2: HTN- - saw PCP Rosalie Gums) 01/05/17 - BMP from 12/09/16 reviewed and showed sodium 140, potassium 4.1 and GFR >60  - Has not been checking BP daily because cuff at home is too small; Said he will pick up a bigger cuff   Patient did not bring his medications nor a list. Each medication was verbally reviewed with the patient and he was encouraged to bring the bottles to every visit to confirm accuracy of list.  Return in 1 month or sooner for any questions/problems before then.  Dolan Amen  Pharmacy Resident  01/13/17

## 2017-02-14 ENCOUNTER — Ambulatory Visit: Payer: Self-pay | Admitting: Family

## 2017-02-14 ENCOUNTER — Telehealth: Payer: Self-pay | Admitting: Family

## 2017-02-14 NOTE — Progress Notes (Deleted)
Patient ID: Jerry Howe, male    DOB: [birth year], 61 y.o.   MRN: [id number]  HPI  Jerry Howe is a 61 y/o male with a history of kidney stones, HTN, arrhthymia and chronic heart failure.   Echo from 12/28/16 reviewed and shows an EF of 20-25% along with mild AR, moderate Jerry and moderately increased PA pressure of 40-45 mm Hg. Echo from 06/11/15 reviewed and shows an EF of 25% along with mild Jerry and trivial TR. Cardiac catheterization done 11/20/14 and showed severe cardiomyopathy with an EF of 17%. 20% stenosis in prox RCA and prox LAD.   Has not been admitted or in the ED in the last 6 months.  He presents today for his follow up visit with a chief complaint of   Past Medical History:  Diagnosis Date  . Anxiety   . Arrhythmia   . CHF (congestive heart failure) (HCC)   . Hypertension   . Kidney stones   . Shortness of breath dyspnea    Past Surgical History:  Procedure Laterality Date  . CARDIAC CATHETERIZATION Right 11/20/2014   Procedure: Left Heart Cath and Coronary Angiography;  Surgeon: Marcina Millard, MD;  Location: [facility] INVASIVE CV LAB;  Service: Cardiovascular;  Laterality: Right;  . IMPLANTABLE CARDIOVERTER DEFIBRILLATOR (ICD) GENERATOR CHANGE Left 08/15/2015   Procedure: ICD IMPLANT, single chamber;  Surgeon: Sharion Settler, MD;  Location: [facility] ORS;  Service: Cardiovascular;  Laterality: Left;  . KNEE SURGERY     Family History  Problem Relation Age of Onset  . Heart failure Neg Hx    Social History  Substance Use Topics  . Smoking status: Never Smoker  . Smokeless tobacco: Never Used  . Alcohol use No   Allergies  Allergen Reactions  . Shellfish Allergy Anaphylaxis     Review of Systems  Constitutional: Positive for fatigue. Negative for appetite change.  HENT: Negative for congestion, postnasal drip and sore throat.   Eyes: Negative.   Respiratory: Positive for shortness of breath (with exertion). Negative for cough and chest tightness.   Cardiovascular:  Negative for chest pain, palpitations and leg swelling.  Gastrointestinal: Negative for abdominal distention and abdominal pain.  Endocrine: Negative.   Genitourinary: Negative.   Musculoskeletal: Negative for back pain and neck pain.  Skin: Negative.   Allergic/Immunologic: Negative.   Neurological: Negative for dizziness and light-headedness.  Hematological: Negative for adenopathy. Does not bruise/bleed easily.  Psychiatric/Behavioral: Negative for dysphoric mood and sleep disturbance (sleeping on 2 pillows). The patient is not nervous/anxious.     Lab Results  Component Value Date   CREATININE 1.37 (H) 12/09/2016   CREATININE 1.48 (H) 01/26/2016   CREATININE 1.31 (H) 08/04/2015   Physical Exam  Constitutional: He is oriented to person, place, and time. He appears well-developed and well-nourished.  HENT:  Head: Normocephalic and atraumatic.  Neck: Normal range of motion. Neck supple. No JVD present.  Cardiovascular: Normal rate and regular rhythm.   Pulmonary/Chest: Effort normal. He has no wheezes. He has no rales.  Abdominal: Soft. He exhibits no distension. There is no tenderness.  Musculoskeletal: He exhibits no edema or tenderness.  Neurological: He is alert and oriented to person, place, and time.  Skin: Skin is warm and dry.  Psychiatric: He has a normal mood and affect. His behavior is normal. Thought content normal.  Nursing note and vitals reviewed.    Assessment & Plan:  1: Chronic heart failure with reduced ejection fraction- - NYHA class II - euvolemic today  -  already weighing daily; instructed to call for an overnight weight gain of >2 pounds or a weekly weight gain of >5 pounds - has been using salt substitute and occasionally regular salt; Overall making healthier choices such as baked foods over fried and limiting soda and increasing fruits/veggies; Has lost 7 pounds since last appointment (11/2016)  - Voucher was given to patient last visit for Entresto  24/26 to get the first 30 days of medication at no charge -  - Spironolactone was stopped over a year ago because of dizziness  - check a BMP next month due to not having insurance  - discussed switching his lopressor to metoprolol succinate at next visit when he has insurance  - saw cardiologist (Paraschos) 11/22/16 - ICD placed 08/15/15 and has not any shocks delivered  2: HTN- - saw PCP Rosalie Gums([place]) 01/05/17 - BMP from 12/09/16 reviewed and showed sodium 140, potassium 4.1 and GFR >60  - Has not been checking BP daily because cuff at home is too small; Said he will pick up a bigger cuff   Patient did not bring his medications nor a list. Each medication was verbally reviewed with the patient and he was encouraged to bring the bottles to every visit to confirm accuracy of list.

## 2017-02-14 NOTE — Telephone Encounter (Signed)
Patient did not show for his [facility] appointment on 02/14/17. Will attempt to reschedule.

## 2017-05-10 IMAGING — CT CT HEAD W/O CM
3 series · 15 of 46 positions shown, 18 images · non-contrast
Comparison: None.

CLINICAL DATA: New onset of vertigo.  No trauma

EXAM:
CT HEAD WITHOUT CONTRAST
TECHNIQUE: Contiguous axial images were obtained from the base of the skull
through the vertex without intravenous contrast.

[Series 2: head wo · axial · 0.41mm/px · z∈[+514,+634]mm · 9 of 29 slices shown, 12 images]
[im 3/29  brain]
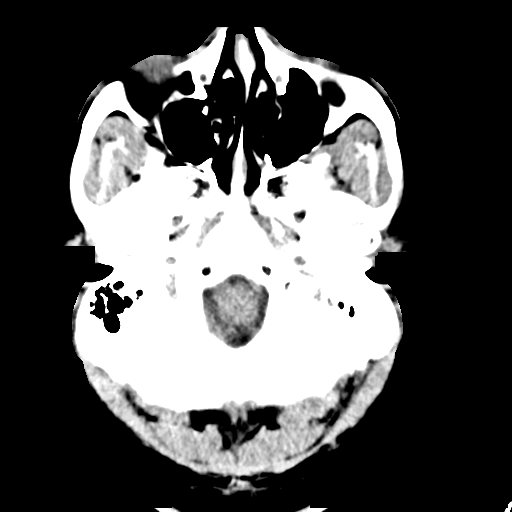
[im 3/29  bone]
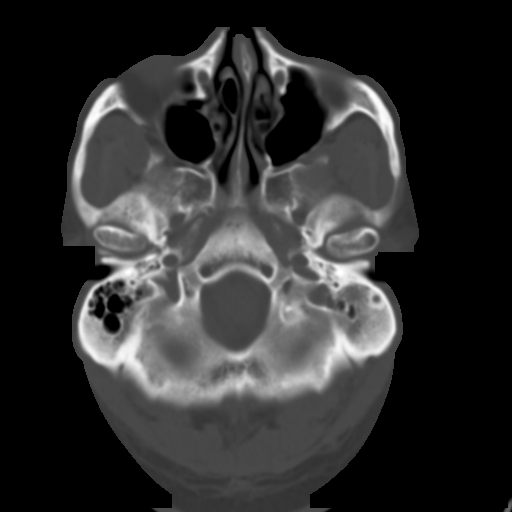
[im 6/29  brain]
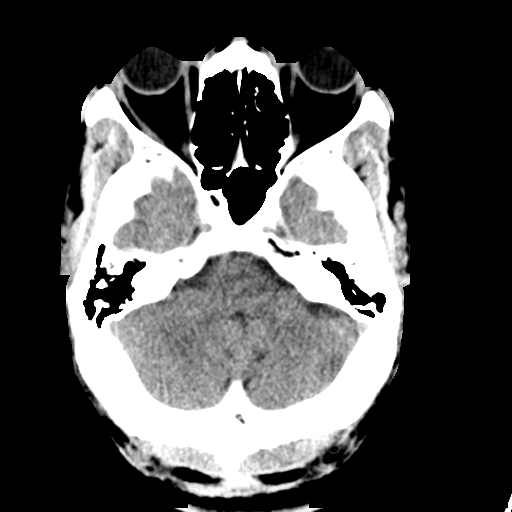
[im 9/29  brain]
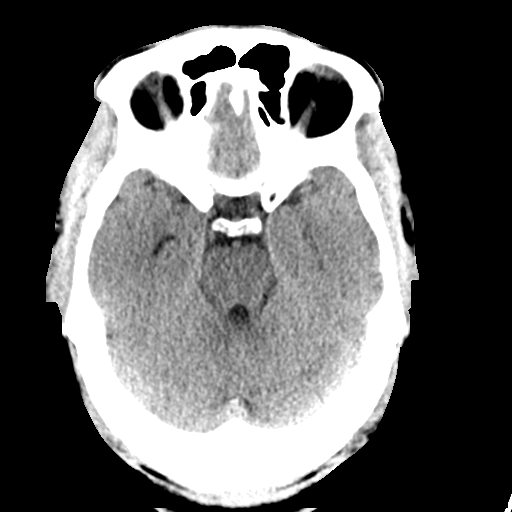
[im 12/29  brain]
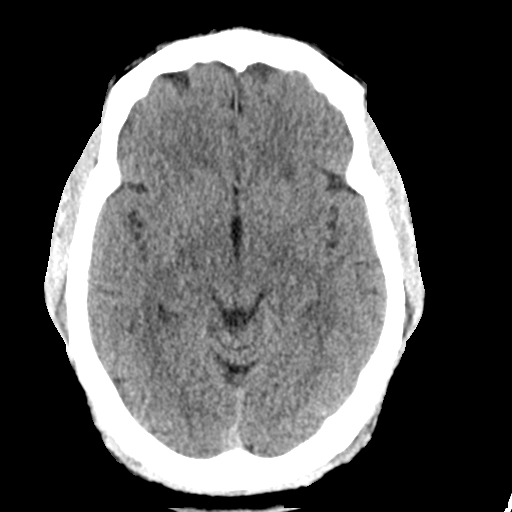
[im 15/29  brain]
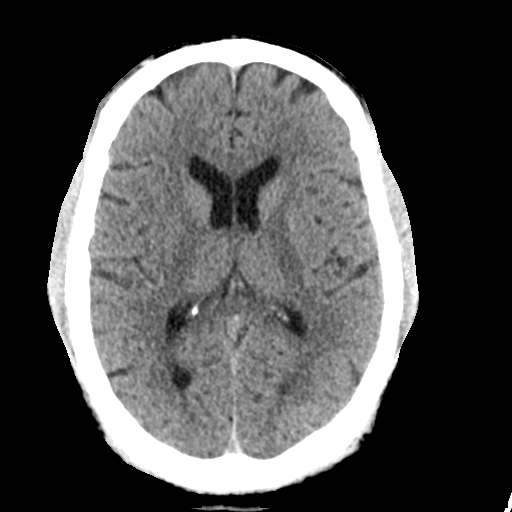
[im 15/29  bone]
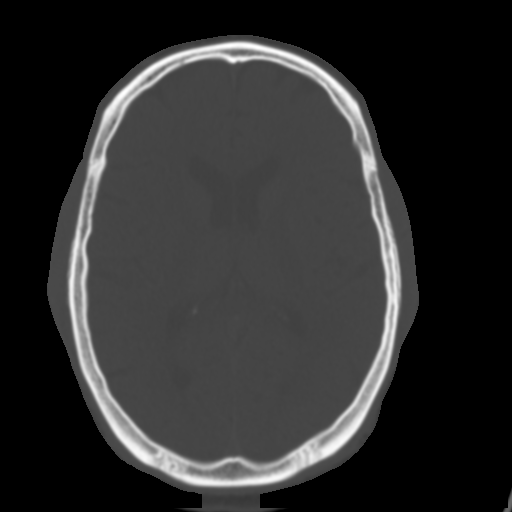
[im 18/29  brain]
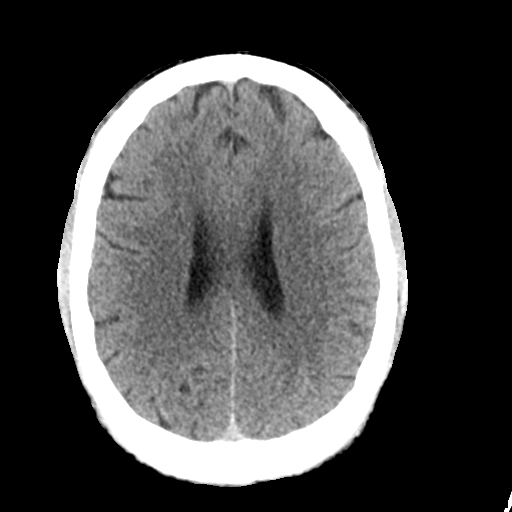
[im 21/29  brain]
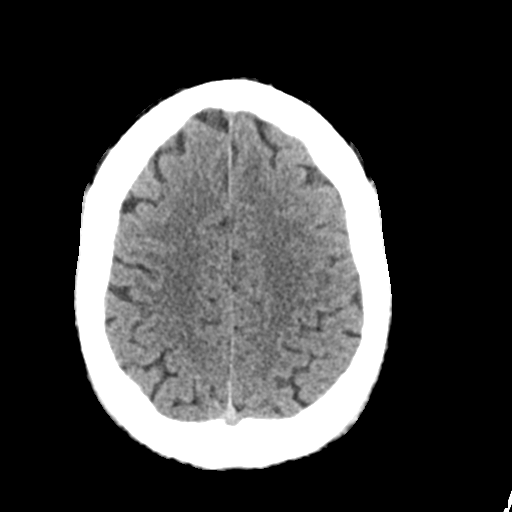
[im 24/29  brain]
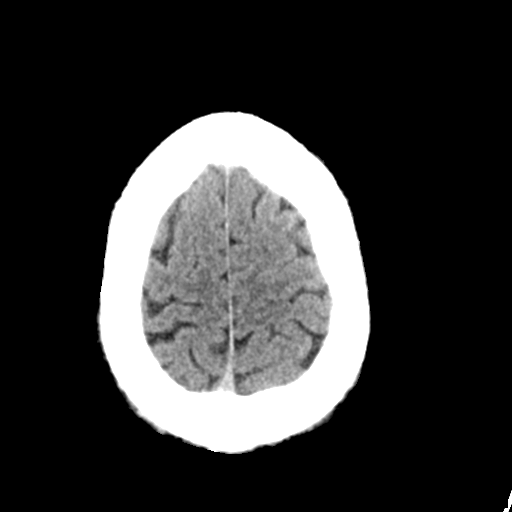
[im 27/29  brain]
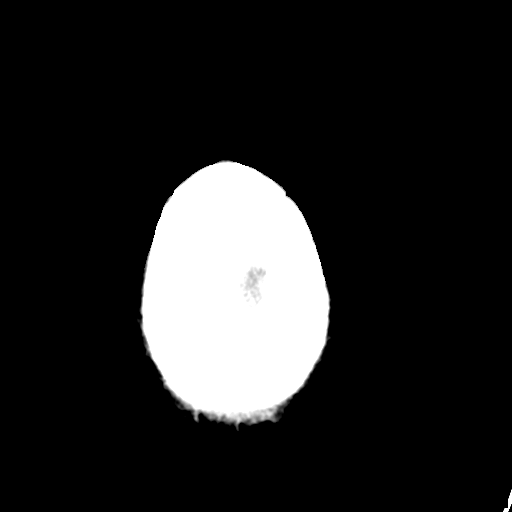
[im 27/29  bone]
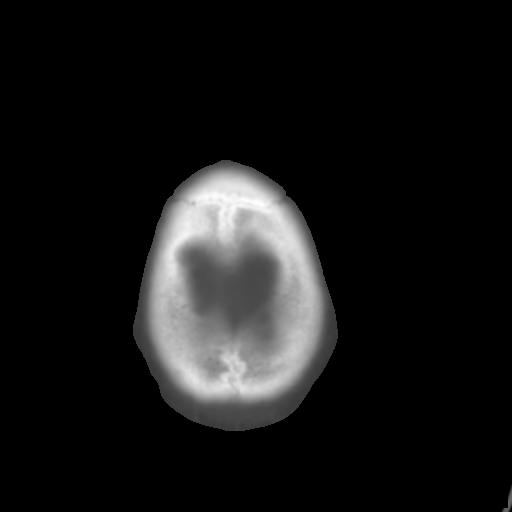

[Series 4: coronal soft tissue · coronal · 0.29mm/px · 3 of 67 slices shown]
[im 23/67  brain]
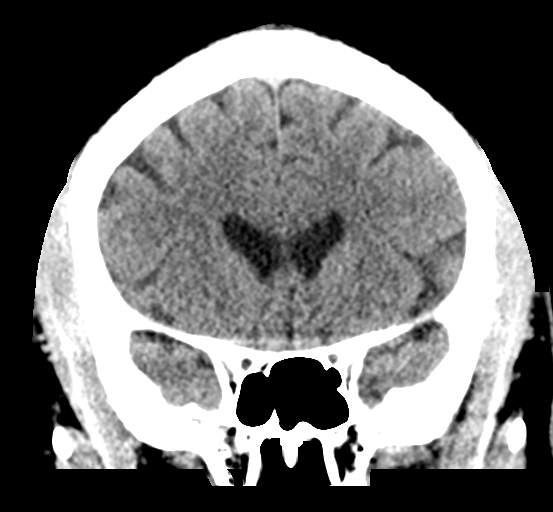
[im 30/67  brain]
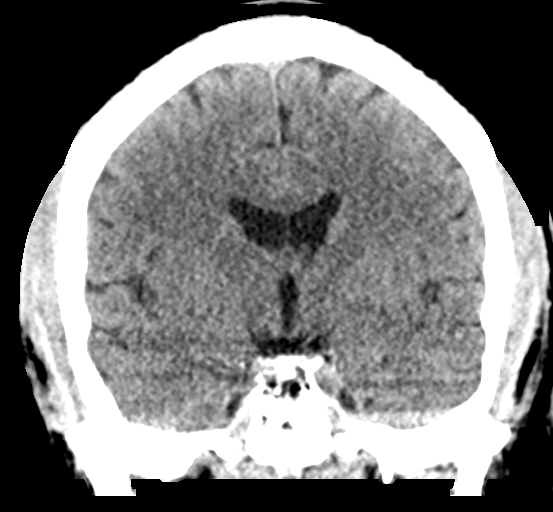
[im 37/67  brain]
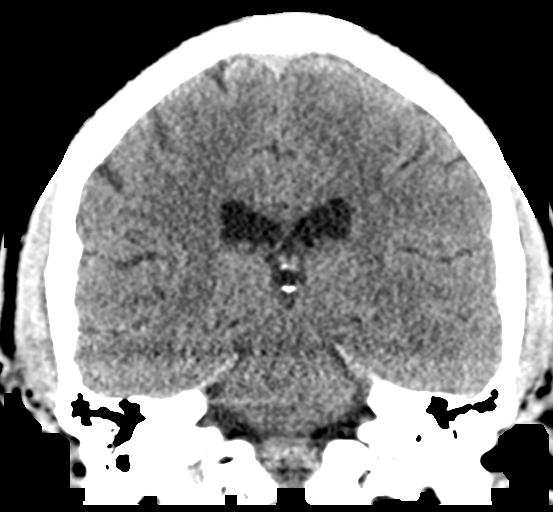

[Series 5: sagittal soft tissue · sagittal · 0.29mm/px · 3 of 54 slices shown]
[im 18/54  brain]
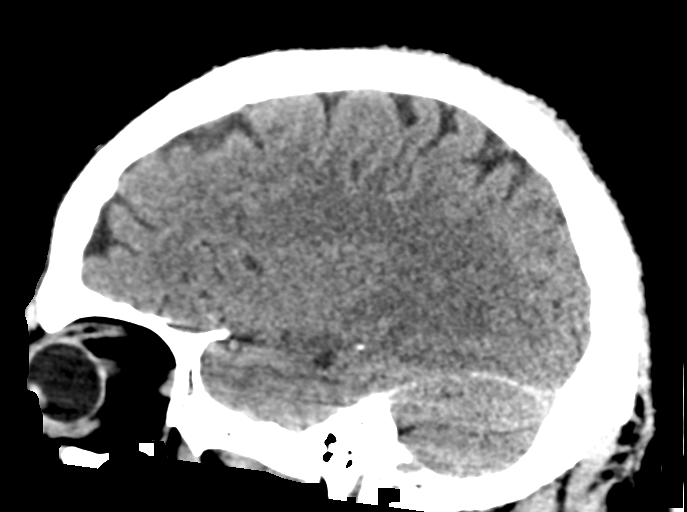
[im 27/54  brain]
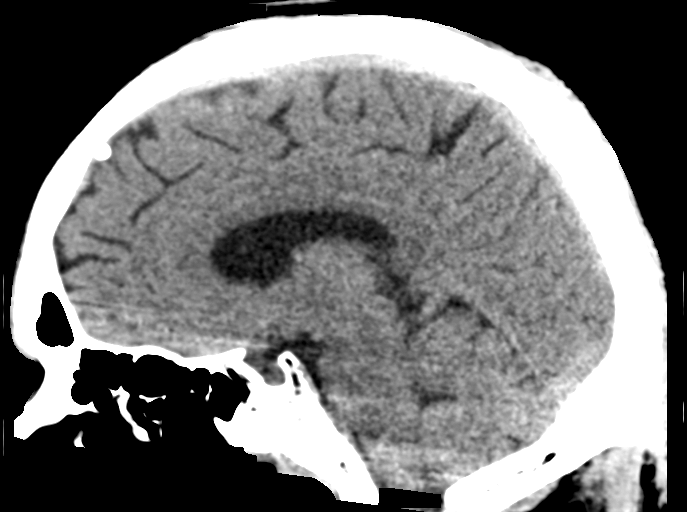
[im 36/54  brain]
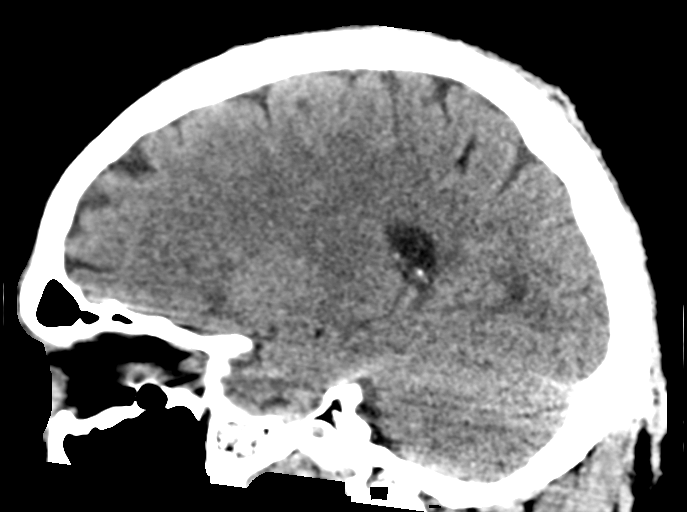

[15 of 46 positions shown; findings below may reference images not displayed]

FINDINGS: Brain: No evidence for acute hemorrhage, mass lesion, midline shift
or hydrocephalus. There is a subtle hypodensity along the inferior
left frontal lobe on sequence 2, image 12 which is nonspecific. This
could represent ischemic change of unknown age.

Vascular: No hyperdense vessel or unexpected calcification.

Skull: Normal. Negative for fracture or focal lesion.

Sinuses/Orbits: No acute finding.

Other: None.
IMPRESSION: Negative for acute hemorrhage.

Subtle hypodensity in the left frontal lobe is nonspecific. Findings
could represent age-indeterminate ischemic change.

## 2018-07-04 DIAGNOSIS — K219 Gastro-esophageal reflux disease without esophagitis: Secondary | ICD-10-CM | POA: Diagnosis present

## 2018-07-04 DIAGNOSIS — F4323 Adjustment disorder with mixed anxiety and depressed mood: Secondary | ICD-10-CM | POA: Insufficient documentation

## 2018-07-10 DIAGNOSIS — E1129 Type 2 diabetes mellitus with other diabetic kidney complication: Secondary | ICD-10-CM | POA: Insufficient documentation

## 2019-03-02 DIAGNOSIS — M51369 Other intervertebral disc degeneration, lumbar region without mention of lumbar back pain or lower extremity pain: Secondary | ICD-10-CM | POA: Insufficient documentation

## 2020-11-17 DIAGNOSIS — I4901 Ventricular fibrillation: Secondary | ICD-10-CM | POA: Insufficient documentation

## 2021-06-23 DIAGNOSIS — I5022 Chronic systolic (congestive) heart failure: Secondary | ICD-10-CM | POA: Diagnosis not present

## 2021-06-23 DIAGNOSIS — I255 Ischemic cardiomyopathy: Secondary | ICD-10-CM | POA: Diagnosis not present

## 2021-06-23 DIAGNOSIS — Z950 Presence of cardiac pacemaker: Secondary | ICD-10-CM | POA: Diagnosis not present

## 2021-07-23 DIAGNOSIS — I10 Essential (primary) hypertension: Secondary | ICD-10-CM | POA: Diagnosis not present

## 2021-07-23 DIAGNOSIS — E7800 Pure hypercholesterolemia, unspecified: Secondary | ICD-10-CM | POA: Diagnosis not present

## 2021-07-23 DIAGNOSIS — R7303 Prediabetes: Secondary | ICD-10-CM | POA: Diagnosis not present

## 2021-07-30 DIAGNOSIS — E7800 Pure hypercholesterolemia, unspecified: Secondary | ICD-10-CM | POA: Diagnosis not present

## 2021-07-30 DIAGNOSIS — I10 Essential (primary) hypertension: Secondary | ICD-10-CM | POA: Diagnosis not present

## 2021-07-30 DIAGNOSIS — R7303 Prediabetes: Secondary | ICD-10-CM | POA: Diagnosis not present

## 2021-07-30 DIAGNOSIS — I428 Other cardiomyopathies: Secondary | ICD-10-CM | POA: Diagnosis not present

## 2021-08-04 DIAGNOSIS — N183 Chronic kidney disease, stage 3 unspecified: Secondary | ICD-10-CM | POA: Diagnosis not present

## 2021-08-04 DIAGNOSIS — I428 Other cardiomyopathies: Secondary | ICD-10-CM | POA: Diagnosis not present

## 2021-08-04 DIAGNOSIS — I4901 Ventricular fibrillation: Secondary | ICD-10-CM | POA: Diagnosis not present

## 2021-08-04 DIAGNOSIS — E7800 Pure hypercholesterolemia, unspecified: Secondary | ICD-10-CM | POA: Diagnosis not present

## 2021-08-04 DIAGNOSIS — I5022 Chronic systolic (congestive) heart failure: Secondary | ICD-10-CM | POA: Diagnosis not present

## 2021-08-04 DIAGNOSIS — Z95810 Presence of automatic (implantable) cardiac defibrillator: Secondary | ICD-10-CM | POA: Diagnosis not present

## 2021-08-04 DIAGNOSIS — E1122 Type 2 diabetes mellitus with diabetic chronic kidney disease: Secondary | ICD-10-CM | POA: Diagnosis not present

## 2021-08-04 DIAGNOSIS — R0602 Shortness of breath: Secondary | ICD-10-CM | POA: Diagnosis not present

## 2021-08-27 DIAGNOSIS — H52223 Regular astigmatism, bilateral: Secondary | ICD-10-CM | POA: Diagnosis not present

## 2021-08-27 DIAGNOSIS — Z0100 Encounter for examination of eyes and vision without abnormal findings: Secondary | ICD-10-CM | POA: Diagnosis not present

## 2021-08-27 DIAGNOSIS — H524 Presbyopia: Secondary | ICD-10-CM | POA: Diagnosis not present

## 2021-08-27 DIAGNOSIS — H5203 Hypermetropia, bilateral: Secondary | ICD-10-CM | POA: Diagnosis not present

## 2021-08-27 DIAGNOSIS — H2513 Age-related nuclear cataract, bilateral: Secondary | ICD-10-CM | POA: Diagnosis not present

## 2021-08-27 DIAGNOSIS — H04123 Dry eye syndrome of bilateral lacrimal glands: Secondary | ICD-10-CM | POA: Diagnosis not present

## 2021-08-27 DIAGNOSIS — Z135 Encounter for screening for eye and ear disorders: Secondary | ICD-10-CM | POA: Diagnosis not present

## 2021-11-23 DIAGNOSIS — I10 Essential (primary) hypertension: Secondary | ICD-10-CM | POA: Diagnosis not present

## 2021-11-23 DIAGNOSIS — E7800 Pure hypercholesterolemia, unspecified: Secondary | ICD-10-CM | POA: Diagnosis not present

## 2021-11-23 DIAGNOSIS — R7303 Prediabetes: Secondary | ICD-10-CM | POA: Diagnosis not present

## 2021-11-30 DIAGNOSIS — N182 Chronic kidney disease, stage 2 (mild): Secondary | ICD-10-CM | POA: Diagnosis not present

## 2021-11-30 DIAGNOSIS — M5440 Lumbago with sciatica, unspecified side: Secondary | ICD-10-CM | POA: Diagnosis not present

## 2021-11-30 DIAGNOSIS — M545 Low back pain, unspecified: Secondary | ICD-10-CM | POA: Diagnosis not present

## 2021-11-30 DIAGNOSIS — E7800 Pure hypercholesterolemia, unspecified: Secondary | ICD-10-CM | POA: Diagnosis not present

## 2021-11-30 DIAGNOSIS — M5136 Other intervertebral disc degeneration, lumbar region: Secondary | ICD-10-CM | POA: Diagnosis not present

## 2021-11-30 DIAGNOSIS — Z0000 Encounter for general adult medical examination without abnormal findings: Secondary | ICD-10-CM | POA: Diagnosis not present

## 2021-11-30 DIAGNOSIS — R7303 Prediabetes: Secondary | ICD-10-CM | POA: Diagnosis not present

## 2021-11-30 DIAGNOSIS — Z1211 Encounter for screening for malignant neoplasm of colon: Secondary | ICD-10-CM | POA: Diagnosis not present

## 2021-12-21 DIAGNOSIS — Z1211 Encounter for screening for malignant neoplasm of colon: Secondary | ICD-10-CM | POA: Diagnosis not present

## 2021-12-26 LAB — COLOGUARD
COLOGUARD: NEGATIVE
COLOGUARD: NEGATIVE

## 2022-01-04 DIAGNOSIS — I428 Other cardiomyopathies: Secondary | ICD-10-CM | POA: Insufficient documentation

## 2022-01-04 NOTE — Progress Notes (Unsigned)
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 Medications reviewed by the patient verbally. Patient shortness of breath with over exertion.     HPI:  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 Tressia Miners for nonischemic cardiomyopathy, CHF  Sx of SOB in 2016, was working out at the time Followed by cardiology at [place], last seen April 2023 2D echocardiogram on 07/11/2018 revealed severely reduced left ventricular systolic function with LVEF 25 to 30% with mild tricuspid and aortic insufficiency and moderate mitral insufficiency with severe left ventricular enlargement.  currently on metoprolol succinate, lisinopril, and spironolactone, which are well-tolerated without apparent side effects. The patient had previously been on Entresto, but it was discontinued due to unspecified side effects and expense.   Echo 06/2018 EF 25 to 30%, moderate MR SEVERE LV ENLARGEMENT   Still working , buffs floors, does a little, takes breaks Reports having shortness of breath when moving or lifting heavy items such as moving multiple garbage cans at the same time, has to sometimes stop take a break  Reports he has not tried Entresto in the past EKG personally reviewed by myself on todays visit Normal sinus rhythm rate 67 bpm nonspecific T wave abnormality V5, V6, 1 and aVL, QRS duration 120  ms   PMH:   has a past medical history of Anxiety, Arrhythmia, CHF (congestive heart failure) ([place]), Hypertension, Kidney stones, and Shortness of breath dyspnea.  PSH:    Past Surgical History:  Procedure Laterality Date   CARDIAC CATHETERIZATION Right 11/20/2014   Procedure: Left Heart Cath and Coronary Angiography;  Surgeon: Isaias Cowman, MD;  Location: [place] CV LAB;  Service: Cardiovascular;  Laterality: Right;   IMPLANTABLE CARDIOVERTER DEFIBRILLATOR (ICD) GENERATOR CHANGE Left 08/15/2015   Procedure: ICD IMPLANT, single chamber;  Surgeon: Marzetta Board, MD;  Location: [facility] ORS;  Service: Cardiovascular;  Laterality: Left;   KNEE SURGERY      Current Outpatient Medications  Medication Sig Dispense Refill   albuterol (PROVENTIL HFA;VENTOLIN HFA) 108 (90 Base) MCG/ACT inhaler Inhale 1 puff into the lungs every 6 (six) hours as needed for wheezing or shortness of breath.     Ascorbic Acid (VITAMIN C) 1000 MG tablet Take 1,000 mg by mouth daily.     cyanocobalamin 2000 MCG tablet Take 2,000 mcg by mouth daily.     lisinopril (ZESTRIL) 20 MG tablet Take 20 mg by mouth daily.     Magnesium 200 MG TABS Take 200 mg by mouth daily.     metoprolol succinate (TOPROL-XL) 25 MG 24 hr tablet Take 25 mg by mouth daily.     Multiple Vitamins-Minerals (SUPER THERA VITE M PO) Take 1 tablet by mouth daily.     Nutritional Supplements (NOURISH) LIQD Take by mouth.     omeprazole (PRILOSEC) 20 MG capsule Take by mouth.     pyridoxine (B-6) 500 MG tablet Take 500 mg by mouth daily.     spironolactone (ALDACTONE) 25 MG tablet Take 1  tablet by mouth daily.     No current facility-administered medications for this visit.    Allergies:   Shellfish allergy   Social History:  The patient  reports that he has never smoked. He has never used smokeless tobacco. He reports that he does not drink alcohol and does not use drugs.   Family History:   family history includes Diabetes in his brother;  Heart disease in his father; Hyperlipidemia in his brother and mother; Hypertension in his father.    Review of Systems: Review of Systems  Constitutional: Negative.   HENT: Negative.    Respiratory:  Positive for shortness of breath.   Cardiovascular: Negative.   Gastrointestinal: Negative.   Musculoskeletal: Negative.   Neurological: Negative.   Psychiatric/Behavioral: Negative.    All other systems reviewed and are negative.    PHYSICAL EXAM: VS:  BP 110/80 (BP Location: Right Arm, Patient Position: Sitting, Cuff Size: Large)   Pulse 67   Ht 5\' 9"  (1.753 m)   Wt 225 lb 2 oz (102.1 kg)   SpO2 98%   BMI 33.25 kg/m  , BMI Body mass index is 33.25 kg/m. GEN: Well nourished, well developed, in no acute distress HEENT: normal Neck: no JVD, carotid bruits, or masses Cardiac: RRR; no murmurs, rubs, or gallops,no edema  Respiratory:  clear to auscultation bilaterally, normal work of breathing GI: soft, nontender, nondistended, + BS MS: no deformity or atrophy Skin: warm and dry, no rash Neuro:  Strength and sensation are intact Psych: euthymic mood, full affect   Recent Labs: No results found for requested labs within last 365 days.    Lipid Panel Lab Results  Component Value Date   CHOL 184 11/19/2014   HDL 40 (L) 11/19/2014   LDLCALC 106 (H) 11/19/2014   TRIG 188 (H) 11/19/2014      Wt Readings from Last 3 Encounters:  01/05/22 225 lb 2 oz (102.1 kg)  01/13/17 231 lb 8 oz (105 kg)  12/09/16 238 lb 2 oz (108 kg)     ASSESSMENT AND PLAN:  Problem List Items Addressed This Visit       Cardiology Problems   NICM (nonischemic cardiomyopathy) (HCC)   Relevant Medications   lisinopril (ZESTRIL) 20 MG tablet   metoprolol succinate (TOPROL-XL) 25 MG 24 hr tablet   spironolactone (ALDACTONE) 25 MG tablet   HTN (hypertension) - Primary   Relevant Medications   lisinopril (ZESTRIL) 20 MG tablet   metoprolol succinate (TOPROL-XL) 25 MG 24 hr tablet    spironolactone (ALDACTONE) 25 MG tablet   Chronic systolic heart failure (HCC)   Relevant Medications   lisinopril (ZESTRIL) 20 MG tablet   metoprolol succinate (TOPROL-XL) 25 MG 24 hr tablet   spironolactone (ALDACTONE) 25 MG tablet   Nonischemic cardiomyopathy Single-chamber ICD in place Nonischemic by catheterization in 2016 Etiology unclear We have recommended he stop lisinopril for 36 hours Would then start Entresto 24/26 mg twice daily Continue spironolactone 25 daily, continue metoprolol succinate 25 daily Repeat BMP 3 weeks In follow-up consider adding Jardiance/Farxiga Repeat echo once medical management has been optimized  Chronic systolic CHF On spironolactone 25 daily Creatinine 1.5, will closely monitor on Entresto Appears euvolemic on today's visit   Total encounter time more than 60 minutes  Greater than 50% was spent in counseling and coordination of care with the patient    Signed, 2017, M.D., Ph.D. [facility] [place], [place]

## 2022-01-05 ENCOUNTER — Ambulatory Visit
Payer: No Typology Code available for payment source | Attending: Cardiovascular Disease | Admitting: Cardiovascular Disease

## 2022-01-05 ENCOUNTER — Encounter: Payer: Self-pay | Admitting: Cardiovascular Disease

## 2022-01-05 VITALS — BP 110/80 | HR 67 | Ht 69.0 in | Wt 225.1 lb

## 2022-01-05 DIAGNOSIS — I10 Essential (primary) hypertension: Secondary | ICD-10-CM | POA: Diagnosis not present

## 2022-01-05 DIAGNOSIS — I5022 Chronic systolic (congestive) heart failure: Secondary | ICD-10-CM | POA: Diagnosis not present

## 2022-01-05 DIAGNOSIS — I428 Other cardiomyopathies: Secondary | ICD-10-CM

## 2022-01-05 MED ORDER — SACUBITRIL-VALSARTAN 24-26 MG PO TABS: ORAL_TABLET | ORAL | 6 refills | Status: DC

## 2022-01-05 NOTE — Patient Instructions (Addendum)
We will place referral to EP, who patient with ICD   Medication Instructions:  - Your physician has recommended you make the following change in your medication:   1) STOP lisinopril  Once you have been off lisinopril x 36 hours 2) START entresto 24/26 mg: - take 1 tablet by mouth twice a day  Entresto 24-26 mg  LOT: [id number] Exp. 01/2023   If you need a refill on your cardiac medications before your next appointment, please call your pharmacy.    Lab work: - Your physician recommends that you return for lab work in: 3 weeks  BMP  Nature conservation officer at [facility] 1st desk on the right to check in (REGISTRATION)  Lab hours: Monday- Friday (7:30 am- 5:30 pm)   Testing/Procedures: No new testing needed   Follow-Up: At [facility], you and your health needs are our priority.  As part of our continuing mission to provide you with exceptional heart care, we have created designated Provider Care Teams.  These Care Teams include your primary Cardiologist (physician) and Advanced Practice Providers (APPs -  Physician Assistants and Nurse Practitioners) who all work together to provide you with the care you need, when you need it.  You will need a follow up appointment in 3 months  Providers on your designated Care Team:   Murray Hodgkins, NP Christell Faith, PA-C Cadence Kathlen Mody, [place]  COVID-19 Vaccine Information can be found at: [email or web address].uk For questions related to vaccine distribution or appointments, please email [email or web address] [email or web address] or call 8475309076.

## 2022-01-06 ENCOUNTER — Telehealth: Payer: Self-pay | Admitting: *Deleted

## 2022-01-06 DIAGNOSIS — Z95810 Presence of automatic (implantable) cardiac defibrillator: Secondary | ICD-10-CM

## 2022-01-06 DIAGNOSIS — I428 Other cardiomyopathies: Secondary | ICD-10-CM

## 2022-01-06 DIAGNOSIS — I5022 Chronic systolic (congestive) heart failure: Secondary | ICD-10-CM

## 2022-01-06 NOTE — Telephone Encounter (Signed)
-----   Message from Minna Merritts, MD sent at 01/05/2022  5:54 PM EDT ----- Seen in clinic September 19 Forgot to place referral to EP/lambert  to establish care of ICD Thx TG

## 2022-01-06 NOTE — Telephone Encounter (Signed)
EP referral placed. To scheduling to please contact the patient to arrange a next available appointment with Dr. Quentin Ore, per Dr. Rockey Situ.

## 2022-01-11 ENCOUNTER — Telehealth: Payer: Self-pay | Admitting: Cardiovascular Disease

## 2022-01-11 NOTE — Telephone Encounter (Signed)
Pt c/o medication issue:  1. Name of Medication:   metoprolol succinate (TOPROL-XL) 25 MG 24 hr tablet  2. How are you currently taking this medication (dosage and times per day)?  Patient has not started this medication as yet  3. Are you having a reaction (difficulty breathing--STAT)?   4. What is your medication issue?    Patient stated he stopped the metoprolol succinate (TOPROL-XL) 25 MG 24 hr tablet and has not started the Entresto.  Patient would like a call back to confirm these medication changes.

## 2022-01-11 NOTE — Telephone Encounter (Signed)
Patient reports that he accidentally stopped metoprolol instead of lisinopril. He states that he only took the lisinopril with Entresto for three days. He has since realized his mistake and has discontinued the lisinopril and is back on metoprolol. Patient is due for lab work in a couple of weeks which he is aware of.

## 2022-01-18 ENCOUNTER — Telehealth: Payer: Self-pay | Admitting: Cardiology

## 2022-01-18 NOTE — Telephone Encounter (Signed)
-----   Message from Emily Filbert, RN sent at 01/15/2022  8:15 AM EDT ----- He has an EP referral in place per Dr Rockey Situ to establish care of his ICD- per Dr. Rockey Situ- est with Quentin Ore.  Please call to arrange a next available consult for [facility].  Thanks! ----- Message ----- From: Emily Filbert, RN Sent: 01/10/2022   5:30 PM EDT To: Emily Filbert, RN  Did his EP appt get scheduled?

## 2022-01-18 NOTE — Telephone Encounter (Signed)
Lvm to schedule appt with Dr. Lambert 

## 2022-01-22 ENCOUNTER — Telehealth: Payer: Self-pay | Admitting: Cardiology

## 2022-01-22 NOTE — Telephone Encounter (Signed)
LVM to schedule appt, please schedule 

## 2022-01-26 NOTE — Progress Notes (Unsigned)
Electrophysiology Office Note:    Date:  01/27/2022   ID:  Jerry Howe, DOB April 14, 1956, MRN [id number]  PCP:  Sherrin Daisy, MD  [facility], [facility], [facility] Cardiologist:  None  [facility] Electrophysiologist:  Vickie Epley, MD   Referring MD: Minna Merritts, MD   Chief Complaint: ICD care  History of Present Illness:    Jerry Howe is a 66 y.o. male who presents for an evaluation of ICD at the request of Dr Rockey Situ. Their medical history includes HTN, chronic systolic HF and anxiety.  The patient saw Dr Rockey Situ 01/05/2022. Patient followed by Dr Tressia Miners. ICD was implanted 08/15/2015.   He is transitioning his care to [place] from [place] [facility].  He is doing well with his heart rhythm.  No syncope.  No problems with his defibrillator.     Past Medical History:  Diagnosis Date   Anxiety    Arrhythmia    CHF (congestive heart failure) ([place])    Hypertension    Kidney stones    Shortness of breath dyspnea     Past Surgical History:  Procedure Laterality Date   CARDIAC CATHETERIZATION Right 11/20/2014   Procedure: Left Heart Cath and Coronary Angiography;  Surgeon: Isaias Cowman, MD;  Location: [place] CV LAB;  Service: Cardiovascular;  Laterality: Right;   IMPLANTABLE CARDIOVERTER DEFIBRILLATOR (ICD) GENERATOR CHANGE Left 08/15/2015   Procedure: ICD IMPLANT, single chamber;  Surgeon: Marzetta Board, MD;  Location: [facility] ORS;  Service: Cardiovascular;  Laterality: Left;   KNEE SURGERY      Current Medications: Current Meds  Medication Sig   albuterol (PROVENTIL HFA;VENTOLIN HFA) 108 (90 Base) MCG/ACT inhaler Inhale 1 puff into the lungs every 6 (six) hours as needed for wheezing or shortness of breath.   Ascorbic Acid (VITAMIN C) 1000 MG tablet Take 1,000 mg by mouth daily.   cyanocobalamin 2000 MCG tablet Take 2,000 mcg by mouth daily.   Magnesium 200 MG TABS Take 200 mg by mouth daily.   metoprolol succinate (TOPROL-XL) 25 MG 24 hr tablet Take 25 mg by mouth  daily.   Multiple Vitamins-Minerals (SUPER THERA VITE M PO) Take 1 tablet by mouth daily.   Nutritional Supplements (NOURISH) LIQD Take by mouth.   omeprazole (PRILOSEC) 20 MG capsule Take by mouth.   pyridoxine (B-6) 500 MG tablet Take 500 mg by mouth daily.   rosuvastatin (CRESTOR) 10 MG tablet Take 10 mg by mouth at bedtime.   sacubitril-valsartan (ENTRESTO) 24-26 MG Take 1 tablet (24-26 mg) by mouth twice daily   spironolactone (ALDACTONE) 25 MG tablet Take 1 tablet by mouth daily.     Allergies:   Shellfish allergy   Social History   Socioeconomic History   Marital status: Married    Spouse name: Not on file   Number of children: Not on file   Years of education: Not on file   Highest education level: Not on file  Occupational History   Not on file  Tobacco Use   Smoking status: Never   Smokeless tobacco: Never  Vaping Use   Vaping Use: Never used  Substance and Sexual Activity   Alcohol use: No   Drug use: No   Sexual activity: Not on file  Other Topics Concern   Not on file  Social History Narrative   Not on file   Social Determinants of Health   Financial Resource Strain: Not on file  Food Insecurity: Not on file  Transportation Needs: Not on file  Physical Activity:  Not on file  Stress: Not on file  Social Connections: Not on file     Family History: The patient's family history includes Diabetes in his brother; Heart disease in his father; Hyperlipidemia in his brother and mother; Hypertension in his father. There is no history of Heart failure.  ROS:   Please see the history of present illness.    All other systems reviewed and are negative.  EKGs/Labs/Other Studies Reviewed:    The following studies were reviewed today:  01/27/2022 in clinic device interrogation personally reviewed Battery longevity 5.5 years Lead parameters stable No high-voltage therapies OptiVol okay I reviewed episodes of device detected atrial fibrillation.  These do not  appear to be true atrial fibrillation but rather seem most consistent with sinus rhythm with frequent PACs.  Recent Labs: No results found for requested labs within last 365 days.  Recent Lipid Panel    Component Value Date/Time   CHOL 184 11/19/2014 0443   TRIG 188 (H) 11/19/2014 0443   HDL 40 (L) 11/19/2014 0443   CHOLHDL 4.6 11/19/2014 0443   VLDL 38 11/19/2014 0443   LDLCALC 106 (H) 11/19/2014 0443    Physical Exam:    VS:  BP 122/68   Pulse 69   Ht 5\' 9"  (1.753 m)   Wt 226 lb (102.5 kg)   SpO2 98%   BMI 33.37 kg/m     Wt Readings from Last 3 Encounters:  01/27/22 226 lb (102.5 kg)  01/05/22 225 lb 2 oz (102.1 kg)  01/13/17 231 lb 8 oz (105 kg)     GEN:  Well nourished, well developed in no acute distress HEENT: Normal NECK: No JVD; No carotid bruits LYMPHATICS: No lymphadenopathy CARDIAC: RRR, no murmurs, rubs, gallops.  ICD pocket well-healed RESPIRATORY:  Clear to auscultation without rales, wheezing or rhonchi  ABDOMEN: Soft, non-tender, non-distended MUSCULOSKELETAL:  No edema; No deformity  SKIN: Warm and dry NEUROLOGIC:  Alert and oriented x 3 PSYCHIATRIC:  Normal affect       ASSESSMENT:    1. Chronic systolic heart failure (HCC)   2. Primary hypertension   3. NICM (nonischemic cardiomyopathy) (HCC)   4. ICD (implantable cardioverter-defibrillator) in place    PLAN:    In order of problems listed above:  #Chronic systolic heart failure #ICD in situ Nonischemic.  Follows with Dr. 01/15/17.  Continue spironolactone, Entresto, Toprol. We will establish him for remote monitoring with our device clinic.  #Hypertension At goal today.  Recommend checking blood pressures 1-2 times per week at home and recording the values.  Recommend bringing these recordings to the primary care physician.   Follow-up 1 year with APP.      Medication Adjustments/Labs and Tests Ordered: Current medicines are reviewed at length with the patient today.  Concerns  regarding medicines are outlined above.  No orders of the defined types were placed in this encounter.  No orders of the defined types were placed in this encounter.    Signed, Mariah Milling. Rossie Muskrat, MD, [facility], [facility] 01/27/2022 10:02 AM    Electrophysiology [facility]

## 2022-01-27 ENCOUNTER — Ambulatory Visit: Payer: No Typology Code available for payment source | Attending: Cardiology | Admitting: Cardiology

## 2022-01-27 ENCOUNTER — Encounter: Payer: Self-pay | Admitting: Cardiology

## 2022-01-27 VITALS — BP 122/68 | HR 69 | Ht 69.0 in | Wt 226.0 lb

## 2022-01-27 DIAGNOSIS — I10 Essential (primary) hypertension: Secondary | ICD-10-CM | POA: Diagnosis not present

## 2022-01-27 DIAGNOSIS — Z95810 Presence of automatic (implantable) cardiac defibrillator: Secondary | ICD-10-CM

## 2022-01-27 DIAGNOSIS — I428 Other cardiomyopathies: Secondary | ICD-10-CM

## 2022-01-27 DIAGNOSIS — I5022 Chronic systolic (congestive) heart failure: Secondary | ICD-10-CM | POA: Diagnosis not present

## 2022-01-27 LAB — CUP PACEART INCLINIC DEVICE CHECK
Battery Remaining Longevity: 66 mo
Battery Voltage: 2.98 V
Brady Statistic RV Percent Paced: 0.95 %
Date Time Interrogation Session: 20231011102202
HighPow Impedance: 66 Ohm
Implantable Lead Implant Date: 20170428
Implantable Lead Location: 753862
Implantable Pulse Generator Implant Date: 20170428
Lead Channel Impedance Value: 304 Ohm
Lead Channel Impedance Value: 399 Ohm
Lead Channel Pacing Threshold Amplitude: 0.875 V
Lead Channel Pacing Threshold Pulse Width: 0.4 ms
Lead Channel Sensing Intrinsic Amplitude: 15.5 mV
Lead Channel Sensing Intrinsic Amplitude: 16 mV
Lead Channel Setting Pacing Amplitude: 2 V
Lead Channel Setting Pacing Pulse Width: 0.4 ms
Lead Channel Setting Sensing Sensitivity: 0.3 mV

## 2022-01-27 NOTE — Patient Instructions (Signed)
Medication Instructions:  None *If you need a refill on your cardiac medications before your next appointment, please call your pharmacy*   Lab Work: none If you have labs (blood work) drawn today and your tests are completely normal, you will receive your results only by: MyChart Message (if you have MyChart) OR A paper copy in the mail If you have any lab test that is abnormal or we need to change your treatment, we will call you to review the results.   Testing/Procedures: none   Follow-Up: At [facility], you and your health needs are our priority.  As part of our continuing mission to provide you with exceptional heart care, we have created designated Provider Care Teams.  These Care Teams include your primary Cardiologist (physician) and Advanced Practice Providers (APPs -  Physician Assistants and Nurse Practitioners) who all work together to provide you with the care you need, when you need it.  We recommend signing up for the patient portal called "MyChart".  Sign up information is provided on this After Visit Summary.  MyChart is used to connect with patients for Virtual Visits (Telemedicine).  Patients are able to view lab/test results, encounter notes, upcoming appointments, etc.  Non-urgent messages can be sent to your provider as well.   To learn more about what you can do with MyChart, go to [email or web address].    Your next appointment:   1 year(s)  The format for your next appointment:   In Person  Provider:   You will see one of the following Advanced Practice Providers on your designated Care Team:   Christopher Berge, NP Ryan Dunn, PA-C Cadence Furth, PA-C Sheri Hammock, NP      Other Instructions None   Important Information About Sugar       

## 2022-02-07 ENCOUNTER — Inpatient Hospital Stay
Admission: EM | Admit: 2022-02-07 | Discharge: 2022-02-13 | DRG: 286 | Disposition: A | Discharge status: Home / Self Care | Payer: No Typology Code available for payment source | Attending: Internal Medicine | Admitting: Internal Medicine

## 2022-02-07 ENCOUNTER — Emergency Department: Payer: No Typology Code available for payment source

## 2022-02-07 DIAGNOSIS — Z833 Family history of diabetes mellitus: Secondary | ICD-10-CM | POA: Diagnosis not present

## 2022-02-07 DIAGNOSIS — Z95810 Presence of automatic (implantable) cardiac defibrillator: Secondary | ICD-10-CM | POA: Diagnosis not present

## 2022-02-07 DIAGNOSIS — I472 Ventricular tachycardia, unspecified: Secondary | ICD-10-CM

## 2022-02-07 DIAGNOSIS — Z87442 Personal history of urinary calculi: Secondary | ICD-10-CM | POA: Diagnosis not present

## 2022-02-07 DIAGNOSIS — I4729 Other ventricular tachycardia: Secondary | ICD-10-CM | POA: Diagnosis present

## 2022-02-07 DIAGNOSIS — I130 Hypertensive heart and chronic kidney disease with heart failure and stage 1 through stage 4 chronic kidney disease, or unspecified chronic kidney disease: Secondary | ICD-10-CM | POA: Diagnosis present

## 2022-02-07 DIAGNOSIS — I2489 Other forms of acute ischemic heart disease: Secondary | ICD-10-CM | POA: Diagnosis not present

## 2022-02-07 DIAGNOSIS — Z4502 Encounter for adjustment and management of automatic implantable cardiac defibrillator: Secondary | ICD-10-CM | POA: Diagnosis not present

## 2022-02-07 DIAGNOSIS — Z1152 Encounter for screening for COVID-19: Secondary | ICD-10-CM | POA: Diagnosis not present

## 2022-02-07 DIAGNOSIS — I214 Non-ST elevation (NSTEMI) myocardial infarction: Secondary | ICD-10-CM | POA: Diagnosis present

## 2022-02-07 DIAGNOSIS — I4891 Unspecified atrial fibrillation: Secondary | ICD-10-CM | POA: Diagnosis not present

## 2022-02-07 DIAGNOSIS — I471 Supraventricular tachycardia, unspecified: Secondary | ICD-10-CM | POA: Diagnosis not present

## 2022-02-07 DIAGNOSIS — I428 Other cardiomyopathies: Secondary | ICD-10-CM

## 2022-02-07 DIAGNOSIS — E785 Hyperlipidemia, unspecified: Secondary | ICD-10-CM | POA: Diagnosis present

## 2022-02-07 DIAGNOSIS — Z79899 Other long term (current) drug therapy: Secondary | ICD-10-CM

## 2022-02-07 DIAGNOSIS — I2510 Atherosclerotic heart disease of native coronary artery without angina pectoris: Secondary | ICD-10-CM | POA: Diagnosis present

## 2022-02-07 DIAGNOSIS — Z83438 Family history of other disorder of lipoprotein metabolism and other lipidemia: Secondary | ICD-10-CM

## 2022-02-07 DIAGNOSIS — I493 Ventricular premature depolarization: Secondary | ICD-10-CM | POA: Diagnosis not present

## 2022-02-07 DIAGNOSIS — I480 Paroxysmal atrial fibrillation: Principal | ICD-10-CM | POA: Diagnosis present

## 2022-02-07 DIAGNOSIS — I5022 Chronic systolic (congestive) heart failure: Secondary | ICD-10-CM | POA: Diagnosis present

## 2022-02-07 DIAGNOSIS — N1831 Chronic kidney disease, stage 3a: Secondary | ICD-10-CM | POA: Diagnosis present

## 2022-02-07 DIAGNOSIS — I272 Pulmonary hypertension, unspecified: Secondary | ICD-10-CM | POA: Diagnosis present

## 2022-02-07 DIAGNOSIS — R001 Bradycardia, unspecified: Secondary | ICD-10-CM | POA: Diagnosis not present

## 2022-02-07 DIAGNOSIS — R7989 Other specified abnormal findings of blood chemistry: Secondary | ICD-10-CM | POA: Diagnosis not present

## 2022-02-07 DIAGNOSIS — I5043 Acute on chronic combined systolic (congestive) and diastolic (congestive) heart failure: Secondary | ICD-10-CM | POA: Diagnosis not present

## 2022-02-07 DIAGNOSIS — I420 Dilated cardiomyopathy: Secondary | ICD-10-CM | POA: Diagnosis present

## 2022-02-07 DIAGNOSIS — E1122 Type 2 diabetes mellitus with diabetic chronic kidney disease: Secondary | ICD-10-CM | POA: Diagnosis not present

## 2022-02-07 DIAGNOSIS — I517 Cardiomegaly: Secondary | ICD-10-CM | POA: Diagnosis not present

## 2022-02-07 DIAGNOSIS — R000 Tachycardia, unspecified: Secondary | ICD-10-CM | POA: Diagnosis not present

## 2022-02-07 DIAGNOSIS — I5042 Chronic combined systolic (congestive) and diastolic (congestive) heart failure: Secondary | ICD-10-CM

## 2022-02-07 DIAGNOSIS — I10 Essential (primary) hypertension: Secondary | ICD-10-CM | POA: Diagnosis not present

## 2022-02-07 DIAGNOSIS — Z8249 Family history of ischemic heart disease and other diseases of the circulatory system: Secondary | ICD-10-CM | POA: Diagnosis not present

## 2022-02-07 DIAGNOSIS — I491 Atrial premature depolarization: Secondary | ICD-10-CM | POA: Diagnosis not present

## 2022-02-07 LAB — CBC WITH DIFFERENTIAL/PLATELET
Abs Immature Granulocytes: 0.02 10*3/uL (ref 0.00–0.07)
Basophils Absolute: 0.1 10*3/uL (ref 0.0–0.1)
Basophils Relative: 1 %
Eosinophils Absolute: 0.1 10*3/uL (ref 0.0–0.5)
Eosinophils Relative: 1 %
HCT: 41.9 % (ref 39.0–52.0)
Hemoglobin: 13.4 g/dL (ref 13.0–17.0)
Immature Granulocytes: 0 %
Lymphocytes Relative: 16 %
Lymphs Abs: 1.2 10*3/uL (ref 0.7–4.0)
MCH: 26.7 pg (ref 26.0–34.0)
MCHC: 32 g/dL (ref 30.0–36.0)
MCV: 83.6 fL (ref 80.0–100.0)
Monocytes Absolute: 0.5 10*3/uL (ref 0.1–1.0)
Monocytes Relative: 7 %
Neutro Abs: 5.7 10*3/uL (ref 1.7–7.7)
Neutrophils Relative %: 75 %
Platelets: 172 10*3/uL (ref 150–400)
RBC: 5.01 MIL/uL (ref 4.22–5.81)
RDW: 13.5 % (ref 11.5–15.5)
WBC: 7.5 10*3/uL (ref 4.0–10.5)
nRBC: 0 % (ref 0.0–0.2)

## 2022-02-07 LAB — TROPONIN I (HIGH SENSITIVITY)
Troponin I (High Sensitivity): 147 ng/L (ref <18)
Troponin I (High Sensitivity): 702 ng/L (ref <18)

## 2022-02-07 LAB — COMPREHENSIVE METABOLIC PANEL
ALT: 15 U/L (ref 0–44)
AST: 24 U/L (ref 15–41)
Albumin: 3.8 g/dL (ref 3.5–5.0)
Alkaline Phosphatase: 62 U/L (ref 38–126)
Anion gap: 11 (ref 5–15)
BUN: 17 mg/dL (ref 8–23)
CO2: 23 mmol/L (ref 22–32)
Calcium: 9.1 mg/dL (ref 8.9–10.3)
Chloride: 104 mmol/L (ref 98–111)
Creatinine, Ser: 1.52 mg/dL — ABNORMAL HIGH (ref 0.61–1.24)
GFR, Estimated: 51 mL/min — ABNORMAL LOW (ref >60)
Glucose, Bld: 118 mg/dL — ABNORMAL HIGH (ref 70–99)
Potassium: 4 mmol/L (ref 3.5–5.1)
Sodium: 138 mmol/L (ref 135–145)
Total Bilirubin: 0.8 mg/dL (ref 0.3–1.2)
Total Protein: 7.1 g/dL (ref 6.5–8.1)

## 2022-02-07 LAB — MAGNESIUM: Magnesium: 2 mg/dL (ref 1.7–2.4)

## 2022-02-07 LAB — HEPARIN LEVEL (UNFRACTIONATED): Heparin Unfractionated: 0.49 IU/mL (ref 0.30–0.70)

## 2022-02-07 LAB — BRAIN NATRIURETIC PEPTIDE: B Natriuretic Peptide: 300.5 pg/mL — ABNORMAL HIGH (ref 0.0–100.0)

## 2022-02-07 LAB — PHOSPHORUS: Phosphorus: 3.4 mg/dL (ref 2.5–4.6)

## 2022-02-07 LAB — TSH: TSH: 0.835 u[IU]/mL (ref 0.350–4.500)

## 2022-02-07 LAB — SARS CORONAVIRUS 2 BY RT PCR: SARS Coronavirus 2 by RT PCR: NEGATIVE

## 2022-02-07 MED ORDER — PANTOPRAZOLE SODIUM 40 MG PO TBEC
40.0000 mg | DELAYED_RELEASE_TABLET | Freq: Every day | ORAL | Status: DC
  Administered 2022-02-08 – 2022-02-13 (×6): 40 mg via ORAL
  Filled 2022-02-07 (×6): qty 1

## 2022-02-07 MED ORDER — ACETAMINOPHEN 325 MG PO TABS: 650.0000 mg | ORAL_TABLET | Freq: Four times a day (QID) | ORAL | Status: AC | PRN

## 2022-02-07 MED ORDER — METOPROLOL SUCCINATE ER 25 MG PO TB24
25.0000 mg | ORAL_TABLET | Freq: Every day | ORAL | Status: DC
  Administered 2022-02-08 – 2022-02-13 (×5): 25 mg via ORAL
  Filled 2022-02-07 (×6): qty 1

## 2022-02-07 MED ORDER — HEPARIN (PORCINE) 25000 UT/250ML-% IV SOLN
1200.0000 [IU]/h | INTRAVENOUS | Status: DC
  Administered 2022-02-07 – 2022-02-09 (×4): 1300 [IU]/h via INTRAVENOUS
  Filled 2022-02-07 (×4): qty 250

## 2022-02-07 MED ORDER — SENNOSIDES-DOCUSATE SODIUM 8.6-50 MG PO TABS: 1.0000 | ORAL_TABLET | Freq: Every evening | ORAL | Status: DC | PRN

## 2022-02-07 MED ORDER — ALBUTEROL SULFATE (2.5 MG/3ML) 0.083% IN NEBU: 3.0000 mL | INHALATION_SOLUTION | Freq: Four times a day (QID) | RESPIRATORY_TRACT | Status: DC | PRN

## 2022-02-07 MED ORDER — SACUBITRIL-VALSARTAN 24-26 MG PO TABS
1.0000 | ORAL_TABLET | Freq: Two times a day (BID) | ORAL | Status: DC
  Administered 2022-02-07 – 2022-02-13 (×12): 1 via ORAL
  Filled 2022-02-07 (×14): qty 1

## 2022-02-07 MED ORDER — VITAMIN B-6 100 MG PO TABS
500.0000 mg | ORAL_TABLET | Freq: Every day | ORAL | Status: DC
  Administered 2022-02-08 – 2022-02-13 (×6): 500 mg via ORAL
  Filled 2022-02-07 (×6): qty 5

## 2022-02-07 MED ORDER — VITAMIN C 500 MG PO TABS
1000.0000 mg | ORAL_TABLET | Freq: Every day | ORAL | Status: DC
  Administered 2022-02-08 – 2022-02-13 (×6): 1000 mg via ORAL
  Filled 2022-02-07 (×6): qty 2

## 2022-02-07 MED ORDER — HEPARIN BOLUS VIA INFUSION
4000.0000 [IU] | Freq: Once | INTRAVENOUS | Status: AC
  Administered 2022-02-07: 4000 [IU] via INTRAVENOUS
  Filled 2022-02-07: qty 4000

## 2022-02-07 MED ORDER — LABETALOL HCL 5 MG/ML IV SOLN: 5.0000 mg | INTRAVENOUS | Status: DC | PRN

## 2022-02-07 MED ORDER — ACETAMINOPHEN 650 MG RE SUPP: 650.0000 mg | Freq: Four times a day (QID) | RECTAL | Status: AC | PRN

## 2022-02-07 MED ORDER — ONDANSETRON HCL 4 MG PO TABS: 4.0000 mg | ORAL_TABLET | Freq: Four times a day (QID) | ORAL | Status: AC | PRN

## 2022-02-07 MED ORDER — HEPARIN SODIUM (PORCINE) 5000 UNIT/ML IJ SOLN: 5000.0000 [IU] | Freq: Three times a day (TID) | INTRAMUSCULAR | Status: DC

## 2022-02-07 MED ORDER — VITAMIN B-12 1000 MCG PO TABS
2000.0000 ug | ORAL_TABLET | Freq: Every day | ORAL | Status: DC
  Administered 2022-02-08 – 2022-02-13 (×6): 2000 ug via ORAL
  Filled 2022-02-07 (×3): qty 2
  Filled 2022-02-07: qty 4
  Filled 2022-02-07 (×2): qty 2

## 2022-02-07 MED ORDER — SPIRONOLACTONE 25 MG PO TABS
25.0000 mg | ORAL_TABLET | Freq: Every day | ORAL | Status: DC
  Administered 2022-02-08 – 2022-02-13 (×6): 25 mg via ORAL
  Filled 2022-02-07 (×6): qty 1

## 2022-02-07 MED ORDER — METOPROLOL TARTRATE 5 MG/5ML IV SOLN: 5.0000 mg | INTRAVENOUS | Status: DC | PRN

## 2022-02-07 MED ORDER — ASPIRIN 81 MG PO TBEC
81.0000 mg | DELAYED_RELEASE_TABLET | Freq: Every day | ORAL | Status: DC
  Administered 2022-02-08 – 2022-02-10 (×3): 81 mg via ORAL
  Filled 2022-02-07 (×3): qty 1

## 2022-02-07 MED ORDER — ONDANSETRON HCL 4 MG/2ML IJ SOLN: 4.0000 mg | Freq: Four times a day (QID) | INTRAMUSCULAR | Status: AC | PRN

## 2022-02-07 NOTE — Consult Note (Signed)
Cardiology Consultation   Patient ID: Jerry Howe MRN: [id number]; DOB: [birth year]  Admit date: 02/07/2022 Date of Consult: 02/07/2022  PCP:  Gladstone Lighter, [place] Providers Cardiologist:  None  Electrophysiologist:  Vickie Epley, MD       Patient Profile:   Jerry Howe is a 66 y.o. male with a hx of chronic systolic and diastolic HF, NICM,  s/p ICD,  and hypertension who is being seen 02/07/2022 for the evaluation of ICD shocks and newy diagnosed atrial fibrillation at the request of Dr. Cinda Quest.  History of Present Illness:   Mr. Howe was diagnosed with heart failure in 2016.  LHC at that time revealed minimal CAD.  LVEF was 25%.  He was followed by [facility] clinic and recently transferred to [facility] heart care.  He established with Dr. Rockey Situ on 12/2021 an Dr. Quentin Ore on 01/2022.  He reported NYHA Class II symptoms and lisinopril switch to Entresto.  He remained on spironolactone and metoprolol.  He established with Dr. Quentin Ore on 01/2022 he was stable and doing well.  He has no history of atrial fibrillation.  He presented to the ED today after his defibrillator fired at church.  He notes that he has generally been feeling well.  Today he got into a heated discussion and his ICD fired.  He denies any preceding chest pain.  He notes that his breathing has been better since starting Entresto but he still struggles with exertional dyspnea and orthopnea at times.  He admits to high sodium intake and can correlate this with worsening in his breathing.  He denies LE edema, orthopnea or PND.  At his part time job he works buffing floors and can generally do this without difficulty. Lately they have been asking him to do more intense labor and he gets short of breath and has to stop.  He denies chest pain.    In the ED he was noted to be in atrial fibrillation.  He converted on his own.  He was started on IV heparin.  Labs were notable for K 4.0, Mg 2.0.   Creatinine 1.52.  Recent baseline unknown.  HS troponin 147-->702.  MDT single chamber ICD interrogation report showed SVT initially.  It then reports 6 shocks for VT/VF.  8.2% atrial fibrillation burden.  He does not have an atrial lead.  His defibrillator was last interrogated 01/27/22 and he had <0.1% AF.  Past Medical History:  Diagnosis Date   Anxiety    Arrhythmia    CHF (congestive heart failure) ([place])    Hypertension    Kidney stones    Shortness of breath dyspnea     Past Surgical History:  Procedure Laterality Date   CARDIAC CATHETERIZATION Right 11/20/2014   Procedure: Left Heart Cath and Coronary Angiography;  Surgeon: Isaias Cowman, MD;  Location: [facility];  Service: Cardiovascular;  Laterality: Right;   IMPLANTABLE CARDIOVERTER DEFIBRILLATOR (ICD) GENERATOR CHANGE Left 08/15/2015   Procedure: ICD IMPLANT, single chamber;  Surgeon: Marzetta Board, MD;  Location: [facility] ORS;  Service: Cardiovascular;  Laterality: Left;   KNEE SURGERY       Home Medications:  Prior to Admission medications   Medication Sig Start Date End Date Taking? Authorizing Provider  Ascorbic Acid (VITAMIN C) 1000 MG tablet Take 1,000 mg by mouth daily.   Yes [provider]  cyanocobalamin 2000 MCG tablet Take 2,000 mcg by mouth daily.   Yes [provider]  Magnesium  200 MG TABS Take 200 mg by mouth daily.   Yes [provider]  metoprolol succinate (TOPROL-XL) 25 MG 24 hr tablet Take 25 mg by mouth daily. 11/25/21  Yes [provider]  Multiple Vitamins-Minerals (SUPER THERA VITE M PO) Take 1 tablet by mouth daily.   Yes [provider]  omeprazole (PRILOSEC) 20 MG capsule Take by mouth. 11/30/21 11/30/22 Yes [provider]  pyridoxine (B-6) 500 MG tablet Take 500 mg by mouth daily.   Yes [provider]  sacubitril-valsartan (ENTRESTO) 24-26 MG Take 1 tablet (24-26 mg) by mouth twice daily 01/05/22  Yes Gollan, Kathlene November, MD   spironolactone (ALDACTONE) 25 MG tablet Take 1 tablet by mouth daily. 07/30/21  Yes [provider]  albuterol (PROVENTIL HFA;VENTOLIN HFA) 108 (90 Base) MCG/ACT inhaler Inhale 1 puff into the lungs every 6 (six) hours as needed for wheezing or shortness of breath.    [provider]  Nutritional Supplements (NOURISH) LIQD Take by mouth.    [provider]  rosuvastatin (CRESTOR) 10 MG tablet Take 10 mg by mouth at bedtime. Patient not taking: Reported on 02/07/2022 01/23/22   [provider]    Inpatient Medications: Scheduled Meds:  [START ON 02/08/2022] vitamin C  1,000 mg Oral Daily   [START ON 02/08/2022] cyanocobalamin  2,000 mcg Oral Daily   [START ON 02/08/2022] metoprolol succinate  25 mg Oral Daily   [START ON 02/08/2022] pantoprazole  40 mg Oral Daily   [START ON 02/08/2022] pyridoxine  500 mg Oral Daily   sacubitril-valsartan  1 tablet Oral BID   [START ON 02/08/2022] spironolactone  25 mg Oral Daily   Continuous Infusions:  heparin 1,300 Units/hr (02/07/22 1709)   PRN Meds: acetaminophen **OR** acetaminophen, albuterol, labetalol, metoprolol tartrate, ondansetron **OR** ondansetron (ZOFRAN) IV, senna-docusate  Allergies:    Allergies  Allergen Reactions   Shellfish Allergy Anaphylaxis    Social History:   Social History   Socioeconomic History   Marital status: Married    Spouse name: Not on file   Number of children: Not on file   Years of education: Not on file   Highest education level: Not on file  Occupational History   Not on file  Tobacco Use   Smoking status: Never   Smokeless tobacco: Never  Vaping Use   Vaping Use: Never used  Substance and Sexual Activity   Alcohol use: No   Drug use: No   Sexual activity: Yes    Partners: Female  Other Topics Concern   Not on file  Social History Narrative   Not on file   Social Determinants of Health   Financial Resource Strain: Not on file  Food Insecurity: Not on  file  Transportation Needs: Not on file  Physical Activity: Not on file  Stress: Not on file  Social Connections: Not on file  Intimate Partner Violence: Not on file    Family History:    Family History  Problem Relation Age of Onset   Hyperlipidemia Mother    Hypertension Father    Heart disease Father    Hyperlipidemia Brother    Diabetes Brother    Heart failure Neg Hx      ROS:  Please see the history of present illness.  All other ROS reviewed and negative.     Physical Exam/Data:   Vitals:   02/07/22 1203 02/07/22 1400 02/07/22 1500 02/07/22 1800  BP:  114/82 117/78 128/84  Pulse:  64 61 69  Resp:  19 15 20   Temp:    98 F (36.7 C)  TempSrc:      SpO2:  97% 99% 99%  Weight: 102.1 kg     Height: 5\' 8"  (1.727 m)      No intake or output data in the 24 hours ending 02/07/22 1820    02/07/2022   12:03 PM 01/27/2022    9:46 AM 01/05/2022    4:06 PM  Last 3 Weights  Weight (lbs) 225 lb 226 lb 225 lb 2 oz  Weight (kg) 102.059 kg 102.513 kg 102.116 kg     VS:  BP 128/84   Pulse 69   Temp 98 F (36.7 C)   Resp 20   Ht 5\' 8"  (1.727 m)   Wt 102.1 kg   SpO2 99%   BMI 34.21 kg/m  , BMI Body mass index is 34.21 kg/m. GENERAL:  Well appearing HEENT: Pupils equal round and reactive, fundi not visualized, oral mucosa unremarkable NECK:  No jugular venous distention, waveform within normal limits, carotid upstroke brisk and symmetric, no bruits, no thyromegaly LUNGS:  Clear to auscultation bilaterally HEART:  RRR.  PMI not displaced or sustained,S1 and S2 within normal limits, no S3, no S4, no clicks, no rubs, no murmurs ABD:  Flat, positive bowel sounds normal in frequency in pitch, no bruits, no rebound, no guarding, no midline pulsatile mass, no hepatomegaly, no splenomegaly EXT:  2 plus pulses throughout, no edema, no cyanosis no clubbing SKIN:  No rashes no nodules NEURO:  Cranial nerves II through XII grossly intact, motor grossly intact throughout PSYCH:   Cognitively intact, oriented to person place and time  EKG:  The EKG was personally reviewed and demonstrates:   Telemetry:  Telemetry was personally reviewed and demonstrates:  atrial fibrillation.  Rate 103 bpm.  PVCs.  IVCD. LVH with secondary repolarization abnormality.  Relevant CV Studies:  Echo 12/28/16:   Study Conclusions   - Left ventricle: The cavity size was severely dilated. Wall    thickness was at the upper limits of normal. Systolic function    was severely reduced. The estimated ejection fraction was in the    range of 20% to 25%. Diffuse hypokinesis. Regional wall motion    abnormalities cannot be excluded. Doppler parameters are    consistent with restrictive physiology, indicative of decreased    left ventricular diastolic compliance and/or increased left    atrial pressure. Doppler parameters are consistent with high    ventricular filling pressure.  - Aortic valve: A bicuspid morphology cannot be excluded.    Transvalvular velocity was within the normal range. There was no    stenosis. There was mild regurgitation.  - Mitral valve: There was moderate regurgitation.  - Left atrium: The atrium was moderately dilated.  - Right ventricle: The cavity size was mildly dilated. Systolic    function was mildly reduced.  - Right atrium: The atrium was moderately dilated.  - Pulmonary arteries: Systolic pressure was mildly to moderately    increased, in the range of 40 mm Hg to 45 mm Hg.  - Pericardium, extracardiac: A small to moderate pericardial    effusion was identified circumferential to the heart.   Laboratory Data:  High Sensitivity Troponin:   Recent Labs  Lab 02/07/22 1221 02/07/22 1447  TROPONINIHS 147* 702*     Chemistry Recent Labs  Lab 02/07/22 1221 02/07/22 1703  NA 138  --   K 4.0  --   CL 104  --  CO2 23  --   GLUCOSE 118*  --   BUN 17  --   CREATININE 1.52*  --   CALCIUM 9.1  --   MG  --  2.0  GFRNONAA 51*  --   ANIONGAP 11  --      Recent Labs  Lab 02/07/22 1221  PROT 7.1  ALBUMIN 3.8  AST 24  ALT 15  ALKPHOS 62  BILITOT 0.8   Lipids No results for input(s): "CHOL", "TRIG", "HDL", "LABVLDL", "LDLCALC", "CHOLHDL" in the last 168 hours.  Hematology Recent Labs  Lab 02/07/22 1221  WBC 7.5  RBC 5.01  HGB 13.4  HCT 41.9  MCV 83.6  MCH 26.7  MCHC 32.0  RDW 13.5  PLT 172   Thyroid  Recent Labs  Lab 02/07/22 1703  TSH 0.835    BNPNo results for input(s): "BNP", "PROBNP" in the last 168 hours.  DDimer No results for input(s): "DDIMER" in the last 168 hours.   Radiology/Studies:  DG Chest Portable 1 View  Result Date: 02/07/2022 CLINICAL DATA:  Defibrillator activation this morning EXAM: PORTABLE CHEST 1 VIEW COMPARISON:  08/16/2015 FINDINGS: Single chamber defibrillator lead into the right ventricle. Stable lead positioning. Stable cardiomegaly. Normal mediastinal contours. There is no edema, consolidation, effusion, or pneumothorax. IMPRESSION: No acute finding.  Cardiomegaly. Electronically Signed   By: Jorje Guild M.D.   On: 02/07/2022 12:13     Assessment and Plan:   # ICD firing:  # PAF:  MDT single chamber ICD fired 6 times today.  Written report shows it was for VT/VF.  He presented in atrial fibrillation with RVR and has converted.  He has some short runs of what looks like atrial fibrillation with aberrancy, but it is very irregular and seems less likely to be VT/VF.  I don't see any P waves during these episodes.  Unable to actually view the strips from his events today.  Will need full interrogation tomorrow.  If he has more NSVT vs afib with aberrancy overnight, would start amiodarone.   HR in 60s in sinus rhythm, so we won't increase metoprolol.  # elevated troponin:  Will need an ischemic evaluation, especially given his exertional dyspnea.  Remote cath showed non-obstructive disease. Continue heparin. Start aspirin.  Continue metoprolol.   # Chronic systolic and diastolic  HF: Euvolemic on exam.  Check BNP.  Continue metoprolol and Entresto.  Recommend Jardiance/Farxiga prior to discharge.  Continue spironolactone.  Ischemic evaluation as above.  Device interrogation as above.   Risk Assessment/Risk Scores:     TIMI Risk Score for Unstable Angina or Non-ST Elevation MI:   The patient's TIMI risk score is 4, which indicates a 20% risk of all cause mortality, new or recurrent myocardial infarction or need for urgent revascularization in the next 14 days.  New York Heart Association (NYHA) Functional Class NYHA Class II  CHA2DS2-VASc Score = 4   This indicates a 4.8% annual risk of stroke. The patient's score is based upon: CHF History: 1 HTN History: 1 Diabetes History: 0 Stroke History: 0 Vascular Disease History: 1 Age Score: 1 Gender Score: 0         For questions or updates, please contact [place] Please consult [email or web address] for contact info under    Signed, Skeet Latch, MD  02/07/2022 6:20 PM

## 2022-02-07 NOTE — Assessment & Plan Note (Signed)
-   Pending repeat - We will continue to monitor

## 2022-02-07 NOTE — ED Triage Notes (Signed)
Pt brought in by ems w c/o his defibrillator firing 3 times this morning. Pt reports he was sitting down at chruch when it shocked him. Reports tenderness around area of pacemaker. No other complaints reported.Pt A&Ox4 on arrival. Pacemaker/defib placed 2017.

## 2022-02-07 NOTE — Assessment & Plan Note (Addendum)
-   Resumed home metoprolol succinate 25 mg daily, Entresto 24- 26 mg tablet p.o. twice daily, spironolactone 25 mg daily resumed - Labetalol 5 mg IV every 2 hours as needed for SBP greater than 180, 4 doses ordered

## 2022-02-07 NOTE — ED Provider Notes (Signed)
[facility] Provider Note    Event Date/Time   First MD Initiated Contact with Patient 02/07/22 1148     (approximate)   History   Pacemaker Problem   HPI  Jerry Howe is a 66 y.o. male who recently transferred from [place] clinic to [place] [facility].  He has an implantable cardiac defibrillator which went off 3 times today.  He said he was sitting down getting a little bit excited with what he was doing and it went off 3 times in a row.  He has not gone off since it was put in in 2017.  Patient had no chest pain shortness of breath or other complaints at the time it went off.      Physical Exam   Triage Vital Signs: ED Triage Vitals  Enc Vitals Group     BP      Pulse      Resp      Temp      Temp src      SpO2      Weight      Height      Head Circumference      Peak Flow      Pain Score      Pain Loc      Pain Edu?      Excl. in [place]?     Most recent vital signs: Vitals:   02/07/22 1400 02/07/22 1500  BP: 114/82 117/78  Pulse: 64 61  Resp: 19 15  Temp:    SpO2: 97% 99%     General: Awake, no distress.  CV:  Good peripheral perfusion.  Heart regular rate and rhythm no audible murmurs Resp:  Normal effort.  Lungs are clear Abd:  No distention.  Soft and nontender Extremities with no edema   ED Results / Procedures / Treatments   Labs (all labs ordered are listed, but only abnormal results are displayed) Labs Reviewed  COMPREHENSIVE METABOLIC PANEL - Abnormal; Notable for the following components:      Result Value   Glucose, Bld 118 (*)    Creatinine, Ser 1.52 (*)    GFR, Estimated 51 (*)    All other components within normal limits  TROPONIN I (HIGH SENSITIVITY) - Abnormal; Notable for the following components:   Troponin I (High Sensitivity) 147 (*)    All other components within normal limits  TROPONIN I (HIGH SENSITIVITY) - Abnormal; Notable for the following components:   Troponin I (High  Sensitivity) 702 (*)    All other components within normal limits  SARS CORONAVIRUS 2 BY RT PCR  CBC WITH DIFFERENTIAL/PLATELET  HIV ANTIBODY (ROUTINE TESTING W REFLEX)  PHOSPHORUS  MAGNESIUM  TSH     EKG  EKG read and interpreted by me shows A-fib at a rate of 103 normal axis there is some ST segment depression and downsloping in V5 and 6 but otherwise nothing.  I cannot find any old records showing A-fib or flutter in the past.  Patient did have flipped T waves on the 19th of last month in V5 and 6.   RADIOLOGY Chest x-ray read by radiology reviewed and interpreted by me shows cardiomegaly quite large in fact but no other changes   PROCEDURES:  Critical Care performed: Critical care time 45 minutes.  This includes talking to the patient reviewing his old records looking at the initial interrogation of the pacemaker and determining if that it was the wrong patient  and then looking at the new pacemaker determination communicating with the cardiologist and then I am calling the hospitalist.  Procedures   MEDICATIONS ORDERED IN ED: Medications  acetaminophen (TYLENOL) tablet 650 mg (has no administration in time range)    Or  acetaminophen (TYLENOL) suppository 650 mg (has no administration in time range)  ondansetron (ZOFRAN) tablet 4 mg (has no administration in time range)    Or  ondansetron (ZOFRAN) injection 4 mg (has no administration in time range)  heparin injection 5,000 Units (has no administration in time range)  senna-docusate (Senokot-S) tablet 1 tablet (has no administration in time range)  labetalol (NORMODYNE) injection 5 mg (has no administration in time range)  metoprolol tartrate (LOPRESSOR) injection 5 mg (has no administration in time range)     IMPRESSION / MDM / ASSESSMENT AND PLAN / ED COURSE  I reviewed the triage vital signs and the nursing notes. ----------------------------------------- 1:35 PM on  02/07/2022 ----------------------------------------- At this point we are waiting for report on the interrogation of the internal defibrillator that we had done. ----------------------------------------- 1:44 PM on 02/07/2022 ----------------------------------------- Apparently the report that was submitted on the pacemaker interrogator is for the wrong pacemaker in the wrong patient.  We will have to do this again. Differential diagnosis includes, but is not limited to,   Patient's presentation is most consistent with acute presentation with potential threat to life or bodily function.  he patient is on the cardiac monitor to evaluate for evidence of arrhythmia and/or significant heart rate changes.  Atrial fibrillation seen here.  The report from the defibrillator shows repeated episodes of the tach and some V-fib as well. We will admit the patient until we can make sure he remains stable.  We will investigate the cause of the arrhythmias  Currently patient is not having any arrhythmias and his pacemaker is not firing any longer.   FINAL CLINICAL IMPRESSION(S) / ED DIAGNOSES   Final diagnoses:  V tach ([place])     Rx / DC Orders   ED Discharge Orders     None        Note:  This document was prepared using Dragon voice recognition software and may include unintentional dictation errors.   Nena Polio, MD 02/07/22 [phone]

## 2022-02-07 NOTE — Assessment & Plan Note (Addendum)
-   This is a new diagnosis for patient -Goal heart rate evaluation is < 120 - Continue heparin GTT - Complete echo ordered

## 2022-02-07 NOTE — Assessment & Plan Note (Addendum)
With nonsustained V. tach.  No chest pain - Initial troponin was 147 and on repeat is 702 - Continue heparin GTT  - Right and left heart cath planned for today

## 2022-02-07 NOTE — Assessment & Plan Note (Addendum)
-   Per [employer] interrogated, readings uploaded to [place] [facility] seen and appreciate input -Continue amiodarone, Toprol XL, Entresto and Aldactone Cardiac cath planned for today

## 2022-02-07 NOTE — ED Notes (Signed)
Dr Cinda Quest given trop result of [phone]

## 2022-02-07 NOTE — Consult Note (Signed)
ANTICOAGULATION CONSULT NOTE - Initial Consult  Pharmacy Consult for heparin infusion Indication: chest pain/ACS  Allergies  Allergen Reactions   Shellfish Allergy Anaphylaxis    Patient Measurements: Height: 5\' 8"  (172.7 cm) Weight: 102.1 kg (225 lb) IBW/kg (Calculated) : 68.4 Heparin Dosing Weight: 90.5kg  Vital Signs: Temp: 97.9 F (36.6 C) (10/22 1150) Temp Source: Oral (10/22 1150) BP: 117/78 (10/22 1500) Pulse Rate: 61 (10/22 1500)  Labs: Recent Labs    02/07/22 1221 02/07/22 1447  HGB 13.4  --   HCT 41.9  --   PLT 172  --   CREATININE 1.52*  --   TROPONINIHS 147* 702*    Estimated Creatinine Clearance: 56.1 mL/min (A) (by C-G formula based on SCr of 1.52 mg/dL (H)).   Medical History: Past Medical History:  Diagnosis Date   Anxiety    Arrhythmia    CHF (congestive heart failure) ([place])    Hypertension    Kidney stones    Shortness of breath dyspnea     Medications:  PTA: N/A Inpatient: Heparin infusion (10/22 > ) Allergies: No AC/APT related allergies  Assessment: 66 year old male with PMH CHF, HTN, and implantable cardiac defibrillator presents to ED following referral from [facility] after pacemaker debilitated 3 times in a row. Troponin I elevated at 147 followed by 702 ng/L. 12-lead EKG notes atrial fibrillation. Pharmacy consulted for management of heparin infusion in the setting of ACS.   Goal of Therapy:  Heparin level 0.3-0.7 units/ml Monitor platelets by anticoagulation protocol: Yes   Date Time aPTT/HL Rate/Comment  Plan:  Order STAT aPTT Give 4000 units bolus x1; then start heparin infusion at 1300 units/hr Check anti-Xa level in 6 hours and daily once consecutively therapeutic. Continue to monitor H&H and platelets daily while on heparin gtt.    Darrick Penna 02/07/2022,4:11 PM

## 2022-02-07 NOTE — Hospital Course (Addendum)
Mr. Brady Schiller is a 66 year old male with history of chronic systolic heart failure, hypertension, nonischemic cardiomyopathy, ICD status, hyperlipidemia, who presents emergency department for chief concerns of defibrillator going off 3 times while at church.  Initial vitals in the ED showed temperature of 97.9, respiration rate of 14, heart rate of 96, blood pressure 113/72, SPO2 of 97% on room air.  Serum sodium is 138, potassium 4.0, chloride 104, bicarb 23, BUN of 17, serum creatinine of 1.52, GFR 51, nonfasting blood glucose 118, WBC 7.5, hemoglobin 13.4, platelets of 172, high sensitive troponin is 147.  EDP consulted cardiology, who are aware of the patient, Dr. Jonelle Sidle.  ED treatment: None  10/25: Cath planned for today 10/26: Cath showed mild nonobstructive CAD and severe pulmonary hypertension.  Ongoing diuresis.  Stopped heparin and started Eliquis 10/27: Advanced CHF c/s with Dr Haroldine Laws - recommends further Diuresis while here

## 2022-02-07 NOTE — Consult Note (Signed)
ANTICOAGULATION CONSULT NOTE  Pharmacy Consult for heparin infusion Indication: chest pain/ACS  Allergies  Allergen Reactions   Shellfish Allergy Anaphylaxis    Patient Measurements: Height: 5\' 8"  (172.7 cm) Weight: 102.1 kg (225 lb) IBW/kg (Calculated) : 68.4 Heparin Dosing Weight: 90.5kg  Vital Signs: Temp: 97.7 F (36.5 C) (10/22 2316) Temp Source: Oral (10/22 2316) BP: 115/77 (10/22 2318) Pulse Rate: 59 (10/22 2316)  Labs: Recent Labs    02/07/22 1221 02/07/22 1447 02/07/22 2311  HGB 13.4  --   --   HCT 41.9  --   --   PLT 172  --   --   HEPARINUNFRC  --   --  0.49  CREATININE 1.52*  --   --   TROPONINIHS 147* 702*  --      Estimated Creatinine Clearance: 56.1 mL/min (A) (by C-G formula based on SCr of 1.52 mg/dL (H)).   Medical History: Past Medical History:  Diagnosis Date   Anxiety    Arrhythmia    CHF (congestive heart failure) ([place])    Hypertension    Kidney stones    Shortness of breath dyspnea     Medications:  PTA: N/A Inpatient: Heparin infusion (10/22 > ) Allergies: No AC/APT related allergies  Assessment: 66 year old male with PMH CHF, HTN, and implantable cardiac defibrillator presents to ED following referral from [facility] clinic after pacemaker debilitated 3 times in a row. Troponin I elevated at 147 followed by 702 ng/L. 12-lead EKG notes atrial fibrillation. Pharmacy consulted for management of heparin infusion in the setting of ACS.   Goal of Therapy:  Heparin level 0.3-0.7 units/ml Monitor platelets by anticoagulation protocol: Yes   Date Time aPTT/HL Rate/Comment 10/22 2311 HL 0.49 Therapeutic x 1  Plan:   Continue heparin infusion at 1300 units/hr Recheck HL w/ AM labs to confirm CBC daily while on heparin  Renda Rolls, PharmD, [facility] 02/07/2022 11:59 PM

## 2022-02-07 NOTE — H&P (Addendum)
History and Physical   Jerry Howe JJH:[id number] DOB: May 21, 1955 DOA: 02/07/2022  PCP: Enid Baas, MD  Outpatient Specialists: Dr. Mariah Milling, [facility] cardiology Patient coming from: [facility] via EMS  I have personally briefly reviewed patient's old medical records in [facility] EMR.  Chief Concern: Defibrillator going off  HPI: [name] is a 66 year old male with history of chronic systolic heart failure, hypertension, nonischemic cardiomyopathy, ICD status, hyperlipidemia, who presents emergency department for chief concerns of defibrillator going off 3 times while at church.  Initial vitals in the ED showed temperature of 97.9, respiration rate of 14, heart rate of 96, blood pressure 113/72, SPO2 of 97% on room air.  Serum sodium is 138, potassium 4.0, chloride 104, bicarb 23, BUN of 17, serum creatinine of 1.52, GFR 51, nonfasting blood glucose 118, WBC 7.5, hemoglobin 13.4, platelets of 172, high sensitive troponin is 147.  EDP consulted cardiology, who are aware of the patient, Dr. Elmarie Shiley.  ED treatment: None -------------------------------- At bedside, he is able tell me his name, age, current location, current calendary. He is watching the [place] and [place] football game. He does not appear to be in acute distress.   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 He then stopped talking. The other minister looked at him when he became quiet and asked if he was okay. Patient then felt  the sensation again. He asked the other rooster to call his wife.  Somebody else that they are going to call the ambulance.  He denies syncope, trauma to his person.  He reports that over last couple days (maybe Thursday or earlier), he has been feeling shortness of breath with laying down and with exertion while performing his normal ADLs (taking trash out, picking  laundry basket, lifting weight).    He has stopped lifting weight because of these feelings.  He thought that he was overdoing it with his exercise.  Over the last couple of months, he reports increased sensation of 'a stuck burp in my chest that is worse with trying to climb stairs'.  He reports the prescribed omeprazole did not help him.  He reports that he did take over-the-counter medication that did help get some of the burp out.  He denies swelling of his lower extremities.  Social history: He lives with his wife. He denies tobacco, etoh, recreational drug use. He currently works as a Production manager, [employer].   Family history: father was on blood thinners for his heart  ROS: Constitutional: no weight change, no fever ENT/Mouth: no sore throat, no rhinorrhea Eyes: no eye pain, no vision changes Cardiovascular: no chest pain, + dyspnea,  no edema, no palpitations Respiratory: no cough, no sputum, no wheezing Gastrointestinal: no nausea, no vomiting, no diarrhea, no constipation Genitourinary: no urinary incontinence, no dysuria, no hematuria Musculoskeletal: no arthralgias, no myalgias Skin: no skin lesions, no pruritus, Neuro: + weakness, no loss of consciousness, no syncope Psych: no anxiety, no depression, no decrease appetite Heme/Lymph: no bruising, no bleeding  ED Course: Discussed with emergency medicine provider, patient requiring hospitalization for chief concerns of defibrillator activation and now atrial fibrillation.  Assessment/Plan  Principal Problem:   NSTEMI (non-ST elevated myocardial infarction) (HCC) Active Problems:   Chronic systolic heart failure (HCC)   HTN (hypertension)   NICM (nonischemic cardiomyopathy) (HCC)   NSVT (nonsustained ventricular tachycardia) (HCC)  Elevated troponin   Atrial fibrillation (HCC)   Assessment and Plan:  * NSTEMI (non-ST elevated myocardial infarction) ([place]) With nonsustained V. tach - Etiology  work-up in progress at this time - Check TSH - Initial high since troponin was 147 and on repeat is 702 - Heparin GTT has been ordered - Cardiology consulted, [facility] is aware of patient - Complete echo ordered - Admit to progressive cardiac, inpatient  Atrial fibrillation ([place]) - This is a new diagnosis for patient -Goal heart rate evaluation is < 120 - Continue heparin GTT - Complete echo ordered  Elevated troponin - Pending repeat - We will continue to monitor  NSVT (nonsustained ventricular tachycardia) ([place]) - Per Medtronic interrogated, readings uploaded to epic media  [facility] cardiology has been consulted by EDP, and they state they will see the patient - Admit to progressive cardiac, inpatient  HTN (hypertension) - Resumed home metoprolol succinate 25 mg daily, Entresto 24- 26 mg tablet p.o. twice daily, spironolactone 25 mg daily resumed - Labetalol 5 mg IV every 2 hours as needed for SBP greater than 180, 4 doses ordered  Chart reviewed.   DVT prophylaxis: Heparin GTT Code Status: full code Diet: Heart healthy now; n.p.o. after midnight Family Communication: updated  Disposition Plan: Pending clinical course Consults called: Cardiology Admission status: Inpatient, progressive cardiac  Past Medical History:  Diagnosis Date   Anxiety    Arrhythmia    CHF (congestive heart failure) ([place])    Hypertension    Kidney stones    Shortness of breath dyspnea    Past Surgical History:  Procedure Laterality Date   CARDIAC CATHETERIZATION Right 11/20/2014   Procedure: Left Heart Cath and Coronary Angiography;  Surgeon: Isaias Cowman, MD;  Location: [place] CV LAB;  Service: Cardiovascular;  Laterality: Right;   IMPLANTABLE CARDIOVERTER DEFIBRILLATOR (ICD) GENERATOR CHANGE Left 08/15/2015   Procedure: ICD IMPLANT, single chamber;  Surgeon: Marzetta Board, MD;  Location: [facility] ORS;  Service: Cardiovascular;  Laterality: Left;   KNEE SURGERY     Social History:   reports that he has never smoked. He has never used smokeless tobacco. He reports that he does not drink alcohol and does not use drugs.  Allergies  Allergen Reactions   Shellfish Allergy Anaphylaxis   Family History  Problem Relation Age of Onset   Hyperlipidemia Mother    Hypertension Father    Heart disease Father    Hyperlipidemia Brother    Diabetes Brother    Heart failure Neg Hx    Family history: Family history reviewed and not pertinent.  Prior to Admission medications   Medication Sig Start Date End Date Taking? Authorizing Provider  albuterol (PROVENTIL HFA;VENTOLIN HFA) 108 (90 Base) MCG/ACT inhaler Inhale 1 puff into the lungs every 6 (six) hours as needed for wheezing or shortness of breath.    [provider]  Ascorbic Acid (VITAMIN C) 1000 MG tablet Take 1,000 mg by mouth daily.    [provider]  cyanocobalamin 2000 MCG tablet Take 2,000 mcg by mouth daily.    [provider]  Magnesium 200 MG TABS Take 200 mg by mouth daily.    [provider]  metoprolol succinate (TOPROL-XL) 25 MG 24 hr tablet Take 25 mg by mouth daily. 11/25/21   [provider]  Multiple Vitamins-Minerals (SUPER THERA VITE M PO) Take 1 tablet by mouth daily.    [provider]  Nutritional Supplements (NOURISH) LIQD Take by mouth.    [provider]  omeprazole (PRILOSEC) 20 MG capsule Take by mouth. 11/30/21 11/30/22  [provider]  pyridoxine (B-6) 500 MG tablet Take 500 mg by mouth daily.    [provider]  rosuvastatin (CRESTOR) 10 MG tablet Take 10 mg by mouth at bedtime. 01/23/22   [provider]  sacubitril-valsartan (ENTRESTO) 24-26 MG Take 1 tablet (24-26 mg) by mouth twice daily 01/05/22   Antonieta Iba, MD  spironolactone (ALDACTONE) 25 MG tablet Take 1 tablet by mouth daily. 07/30/21   [provider]   Physical Exam: Vitals:   02/07/22 1150 02/07/22 1203 02/07/22 1400 02/07/22 1500   BP:   114/82 117/78  Pulse: 96  64 61  Resp:   19 15  Temp: 97.9 F (36.6 C)     TempSrc: Oral     SpO2: 97%  97% 99%  Weight:  102.1 kg    Height:  5\' 8"  (1.727 m)     Constitutional: appears younger than chronological age, NAD, calm, comfortable Eyes: PERRL, lids and conjunctivae normal ENMT: Mucous membranes are moist. Posterior pharynx clear of any exudate or lesions. Age-appropriate dentition. Hearing appropriate Neck: normal, supple, no masses, no thyromegaly Respiratory: clear to auscultation bilaterally, no wheezing, no crackles. Normal respiratory effort. No accessory muscle use.  Cardiovascular: Regular rate and rhythm, no murmurs / rubs / gallops. No extremity edema. 2+ pedal pulses. No carotid bruits.  Abdomen: no tenderness, no masses palpated, no hepatosplenomegaly. Bowel sounds positive.  Musculoskeletal: no clubbing / cyanosis. No joint deformity upper and lower extremities. Good ROM, no contractures, no atrophy. Normal muscle tone.  Skin: no rashes, lesions, ulcers. No induration Neurologic: Sensation intact. Strength 5/5 in all 4.  Psychiatric: Normal judgment and insight. Alert and oriented x 3. Normal mood.   EKG: independently reviewed, showing atrial fibrillation with rate of 103, QTc 451  Chest x-ray on Admission: I personally reviewed and I agree with radiologist reading as below.  DG Chest Portable 1 View  Result Date: 02/07/2022 CLINICAL DATA:  Defibrillator activation this morning EXAM: PORTABLE CHEST 1 VIEW COMPARISON:  08/16/2015 FINDINGS: Single chamber defibrillator lead into the right ventricle. Stable lead positioning. Stable cardiomegaly. Normal mediastinal contours. There is no edema, consolidation, effusion, or pneumothorax. IMPRESSION: No acute finding.  Cardiomegaly. Electronically Signed   By: 08/18/2015 M.D.   On: 02/07/2022 12:13    Labs on Admission: I have personally reviewed following labs  CBC: Recent Labs  Lab 02/07/22 1221   WBC 7.5  NEUTROABS 5.7  HGB 13.4  HCT 41.9  MCV 83.6  PLT 172   Basic Metabolic Panel: Recent Labs  Lab 02/07/22 1221  NA 138  K 4.0  CL 104  CO2 23  GLUCOSE 118*  BUN 17  CREATININE 1.52*  CALCIUM 9.1   GFR: Estimated Creatinine Clearance: 56.1 mL/min (A) (by C-G formula based on SCr of 1.52 mg/dL (H)).  Liver Function Tests: Recent Labs  Lab 02/07/22 1221  AST 24  ALT 15  ALKPHOS 62  BILITOT 0.8  PROT 7.1  ALBUMIN 3.8   Urine analysis:    Component Value Date/Time   COLORURINE AMBER (A) 08/04/2015 1452   APPEARANCEUR HAZY (A) 08/04/2015 1452   LABSPEC 1.019 08/04/2015 1452   PHURINE 5.0 08/04/2015 1452   GLUCOSEU NEGATIVE 08/04/2015 1452   HGBUR NEGATIVE 08/04/2015 1452   BILIRUBINUR NEGATIVE 08/04/2015 1452   KETONESUR NEGATIVE 08/04/2015 1452   PROTEINUR NEGATIVE 08/04/2015 1452   NITRITE NEGATIVE 08/04/2015 1452   LEUKOCYTESUR NEGATIVE 08/04/2015  1452   CRITICAL CARE Performed by: Lovenia Kim  Total critical care time: 35 minutes  Critical care time was exclusive of separately billable procedures and treating other patients.  Critical care was necessary to treat or prevent imminent or life-threatening deterioration.  Critical care was time spent personally by me on the following activities: development of treatment plan with patient and/or surrogate as well as nursing, discussions with consultants, evaluation of patient's response to treatment, examination of patient, obtaining history from patient or surrogate, ordering and performing treatments and interventions, ordering and review of laboratory studies, ordering and review of radiographic studies, pulse oximetry and re-evaluation of patient's condition.  Dr. Sedalia Muta [facility]  If 7PM-7AM, please contact overnight-coverage provider If 7AM-7PM, please contact day coverage provider [email or web address]  02/07/2022, 5:10 PM

## 2022-02-07 NOTE — ED Notes (Signed)
Jerry Howe in pharmacy notified need entresto.

## 2022-02-08 ENCOUNTER — Encounter: Payer: Self-pay | Admitting: Internal Medicine

## 2022-02-08 ENCOUNTER — Encounter: Payer: Self-pay | Admitting: Cardiology

## 2022-02-08 DIAGNOSIS — I4891 Unspecified atrial fibrillation: Secondary | ICD-10-CM | POA: Diagnosis not present

## 2022-02-08 DIAGNOSIS — I428 Other cardiomyopathies: Secondary | ICD-10-CM | POA: Diagnosis not present

## 2022-02-08 DIAGNOSIS — I493 Ventricular premature depolarization: Secondary | ICD-10-CM | POA: Diagnosis not present

## 2022-02-08 DIAGNOSIS — I272 Pulmonary hypertension, unspecified: Secondary | ICD-10-CM | POA: Diagnosis not present

## 2022-02-08 DIAGNOSIS — I5022 Chronic systolic (congestive) heart failure: Secondary | ICD-10-CM | POA: Diagnosis not present

## 2022-02-08 DIAGNOSIS — I214 Non-ST elevation (NSTEMI) myocardial infarction: Secondary | ICD-10-CM | POA: Diagnosis not present

## 2022-02-08 LAB — BASIC METABOLIC PANEL
Anion gap: 8 (ref 5–15)
BUN: 18 mg/dL (ref 8–23)
CO2: 23 mmol/L (ref 22–32)
Calcium: 8.9 mg/dL (ref 8.9–10.3)
Chloride: 106 mmol/L (ref 98–111)
Creatinine, Ser: 1.44 mg/dL — ABNORMAL HIGH (ref 0.61–1.24)
GFR, Estimated: 54 mL/min — ABNORMAL LOW (ref >60)
Glucose, Bld: 94 mg/dL (ref 70–99)
Potassium: 4.3 mmol/L (ref 3.5–5.1)
Sodium: 137 mmol/L (ref 135–145)

## 2022-02-08 LAB — CBC
HCT: 41.4 % (ref 39.0–52.0)
Hemoglobin: 13.8 g/dL (ref 13.0–17.0)
MCH: 27.5 pg (ref 26.0–34.0)
MCHC: 33.3 g/dL (ref 30.0–36.0)
MCV: 82.5 fL (ref 80.0–100.0)
Platelets: 175 10*3/uL (ref 150–400)
RBC: 5.02 MIL/uL (ref 4.22–5.81)
RDW: 13.6 % (ref 11.5–15.5)
WBC: 7.6 10*3/uL (ref 4.0–10.5)
nRBC: 0 % (ref 0.0–0.2)

## 2022-02-08 LAB — MAGNESIUM: Magnesium: 2 mg/dL (ref 1.7–2.4)

## 2022-02-08 LAB — HIV ANTIBODY (ROUTINE TESTING W REFLEX): HIV Screen 4th Generation wRfx: NONREACTIVE

## 2022-02-08 LAB — HEPARIN LEVEL (UNFRACTIONATED): Heparin Unfractionated: 0.61 IU/mL (ref 0.30–0.70)

## 2022-02-08 MED ORDER — AMIODARONE HCL 200 MG PO TABS
200.0000 mg | ORAL_TABLET | Freq: Once | ORAL | Status: AC
  Administered 2022-02-08: 200 mg via ORAL
  Filled 2022-02-08: qty 1

## 2022-02-08 NOTE — ED Notes (Signed)
Foust, np responded to notification of vtach, new order for am magnesium received, foust, np notified to let her know if reoccurs.

## 2022-02-08 NOTE — ED Notes (Signed)
Report to les, emt-p.  

## 2022-02-08 NOTE — Progress Notes (Signed)
[facility] [place] NAME: Jerry Howe    MR#:  [id number]  DATE OF BIRTH:  [birth year]  SUBJECTIVE:  wife at bedside. Patient came in after he had some heated argument in the church. Felt like a thump and noted to have defibrillator shocks a few times at discharge. Patient was found to be in a new onset a fib. Recently has been having shortness of breath and chest pain. During my evaluation appears stable. He was still upset from the event yesterday.    VITALS:  Blood pressure 113/75, pulse 62, temperature 98.7 F (37.1 C), temperature source Oral, resp. rate 20, height 5\' 8"  (1.727 m), weight 102.1 kg, SpO2 95 %.  PHYSICAL EXAMINATION:   GENERAL:  66 y.o.-year-old patient lying in the bed with no acute distress.  LUNGS: Normal breath sounds bilaterally, no wheezing CARDIOVASCULAR: S1, S2 normal. No murmurs,   ABDOMEN: Soft, nontender, nondistended. Bowel sounds present.  EXTREMITIES: No  edema b/l.    NEUROLOGIC: nonfocal  patient is alert and awake SKIN: No obvious rash, lesion, or ulcer.   LABORATORY PANEL:  CBC Recent Labs  Lab 02/08/22 0520  WBC 7.6  HGB 13.8  HCT 41.4  PLT 175    Chemistries  Recent Labs  Lab 02/07/22 1221 02/07/22 1703 02/08/22 0520  NA 138  --  137  K 4.0  --  4.3  CL 104  --  106  CO2 23  --  23  GLUCOSE 118*  --  94  BUN 17  --  18  CREATININE 1.52*  --  1.44*  CALCIUM 9.1  --  8.9  MG  --    < > 2.0  AST 24  --   --   ALT 15  --   --   ALKPHOS 62  --   --   BILITOT 0.8  --   --    < > = values in this interval not displayed.   Cardiac Enzymes No results for input(s): "TROPONINI" in the last 168 hours. RADIOLOGY:  DG Chest Portable 1 View  Result Date: 02/07/2022 CLINICAL DATA:  Defibrillator activation this morning EXAM: PORTABLE CHEST 1 VIEW COMPARISON:  08/16/2015 FINDINGS: Single chamber defibrillator lead into the right ventricle. Stable lead positioning. Stable cardiomegaly.  Normal mediastinal contours. There is no edema, consolidation, effusion, or pneumothorax. IMPRESSION: No acute finding.  Cardiomegaly. Electronically Signed   By: Jorje Guild M.D.   On: 02/07/2022 12:13    Assessment and Plan Jerry Howe is a 66 year old male with history of chronic systolic heart failure, hypertension, nonischemic cardiomyopathy(EF 25%), ICD, hyperlipidemia, who presents emergency department for chief concerns of defibrillator going off 3 times while at church.  He established with Dr. Rockey Situ on 12/2021 an Dr. Quentin Ore on 01/2022 In the ED he was noted to be in atrial fibrillation.  He converted on his own.  He was started on IV heparin.  Per cardiology notes--MDT single chamber ICD interrogation report showed SVT initially.  It then reports 6 shocks for VT/VF.  8.2% atrial fibrillation burden    chronic combined systolic diastolic congestive heart failure secondary to non-ischemic cardiomyopathy status post ICD with device firing -- patient appears operating heart failure today. Sats stable on room air. -- Medtronic single-chamber full interrogation to be done per cardiology note -- possible a fib -- electrolyte replacement -- continue IV heparin -- continue Toprol-XL, Spironolactone and enteresto  Chest pain with elevated troponin -- history  of nonobstructive disease -- continue IV heparin drip. -Elevated troponin- --cardiology plans right and left heart catheterization -- continue aspirin  New diagnosis of a fib -- patient currently in sinus rhythm -- on heparin drip -- will likely need oral anticoagulation at discharge defer to cardiology -- TSH stable  Hypertension -- resume home meds  CKD stage IIIa --creat 1.3-1.5  Procedures: Family communication : wife Consults : [facility] cardiology CODE STATUS: full DVT Prophylaxis : heparin drip Level of care: Progressive Status is: Inpatient Remains inpatient appropriate because: right and left heart  catheterization    TOTAL TIME TAKING CARE OF THIS PATIENT: 35 minutes.  >50% time spent on counselling and coordination of care  Note: This dictation was prepared with Dragon dictation along with smaller phrase technology. Any transcriptional errors that result from this process are unintentional.  Enedina Finner M.D    [facility]   CC: Primary care physician; Enid Baas, MD

## 2022-02-08 NOTE — ED Notes (Signed)
Pt with another run of 13 beat v tach, pt sleeping, arouses easily. Foust, np notified, awaiting response.

## 2022-02-08 NOTE — Consult Note (Signed)
ELECTROPHYSIOLOGY CONSULT NOTE  Patient ID: KESHAUN DUBEY, MRN: [id number], DOB/AGE: 1955/07/12 66 y.o. Admit date: 02/07/2022 Date of Consult: 02/08/2022  Primary Physician: Enid Baas, MD Primary Cardiologist: ELESTER APODACA is a 66 y.o. male who is being seen today for the evaluation of ICD shocks at the request of Dr. Bernita Buffy.   Chief Complaint: ICD shocks   HPI ENNIO HOUP is a 66 y.o. male admitted with ICD shocks; implanted for primary prevention 2017-single-chamber Medtronic.  Nonischemic cardiomyopathy.  No known history of atrial fibrillation.  On Sunday, following a agitated conversation in church, he had a series of 6 shocks.  No significant lightheadedness.  No presyncope or syncope with this event.  Had noted for the last month increasing issues with lightheadedness both postural  and for example in the shower as well as worsening dyspnea on exertion and greater fatigue.  Perhaps some palpitations.  No known history of PVCs  Functional status is relatively good at baseline.  Is able to climb 1-2 flights of stairs.  But does have 2-3 pillow orthopnea, some abdominal swelling, PND but no peripheral edema   DATE TEST EF   8/16 Echo   25-30 %   8/16 LHC  17 % No obstructive CAD  9/18 Echo  20-25%   3/20 Echo  25-30% LVIDD 7.6/LVIDS6.8             Date Cr K Hgb  10/23 1.44 4.3 13.8          Thromboembolic risk factors ( age -70, HTN-1, CHF-1) for a CHADSVASc Score of 3   Past Medical History:  Diagnosis Date   Anxiety    Arrhythmia    CHF (congestive heart failure) (HCC)    Hypertension    Kidney stones    Shortness of breath dyspnea        Surgical History:  Past Surgical History:  Procedure Laterality Date   CARDIAC CATHETERIZATION Right 11/20/2014   Procedure: Left Heart Cath and Coronary Angiography;  Surgeon: Marcina Millard, MD;  Location: [facility] INVASIVE CV LAB;  Service: Cardiovascular;  Laterality: Right;   IMPLANTABLE CARDIOVERTER  DEFIBRILLATOR (ICD) GENERATOR CHANGE Left 08/15/2015   Procedure: ICD IMPLANT, single chamber;  Surgeon: Sharion Settler, MD;  Location: [facility] ORS;  Service: Cardiovascular;  Laterality: Left;   KNEE SURGERY       Home Meds: Prior to Admission medications   Medication Sig Start Date End Date Taking? Authorizing Provider  Ascorbic Acid (VITAMIN C) 1000 MG tablet Take 1,000 mg by mouth daily.   Yes [provider]  cyanocobalamin 2000 MCG tablet Take 2,000 mcg by mouth daily.   Yes [provider]  Magnesium 200 MG TABS Take 200 mg by mouth daily.   Yes [provider]  metoprolol succinate (TOPROL-XL) 25 MG 24 hr tablet Take 25 mg by mouth daily. 11/25/21  Yes [provider]  Multiple Vitamins-Minerals (SUPER THERA VITE M PO) Take 1 tablet by mouth daily.   Yes [provider]  omeprazole (PRILOSEC) 20 MG capsule Take by mouth. 11/30/21 11/30/22 Yes [provider]  pyridoxine (B-6) 500 MG tablet Take 500 mg by mouth daily.   Yes [provider]  sacubitril-valsartan (ENTRESTO) 24-26 MG Take 1 tablet (24-26 mg) by mouth twice daily 01/05/22  Yes Gollan, Tollie Pizza, MD  spironolactone (ALDACTONE) 25 MG tablet Take 1 tablet by mouth daily. 07/30/21  Yes [provider]  albuterol (PROVENTIL HFA;VENTOLIN HFA) 108 (90 Base) MCG/ACT  inhaler Inhale 1 puff into the lungs every 6 (six) hours as needed for wheezing or shortness of breath.    [provider]  Nutritional Supplements (NOURISH) LIQD Take by mouth.    [provider]  rosuvastatin (CRESTOR) 10 MG tablet Take 10 mg by mouth at bedtime. Patient not taking: Reported on 02/07/2022 01/23/22   [provider]    Inpatient Medications:   vitamin C  1,000 mg Oral Daily   aspirin EC  81 mg Oral Daily   cyanocobalamin  2,000 mcg Oral Daily   metoprolol succinate  25 mg Oral Daily   pantoprazole  40 mg Oral Daily   pyridoxine  500 mg Oral Daily    sacubitril-valsartan  1 tablet Oral BID   spironolactone  25 mg Oral Daily      Allergies:  Allergies  Allergen Reactions   Shellfish Allergy Anaphylaxis    Social History   Socioeconomic History   Marital status: Married    Spouse name: Not on file   Number of children: Not on file   Years of education: Not on file   Highest education level: Not on file  Occupational History   Not on file  Tobacco Use   Smoking status: Never   Smokeless tobacco: Never  Vaping Use   Vaping Use: Never used  Substance and Sexual Activity   Alcohol use: No   Drug use: No   Sexual activity: Yes    Partners: Female  Other Topics Concern   Not on file  Social History Narrative   Not on file   Social Determinants of Health   Financial Resource Strain: Not on file  Food Insecurity: Not on file  Transportation Needs: Not on file  Physical Activity: Not on file  Stress: Not on file  Social Connections: Not on file  Intimate Partner Violence: Not on file     Family History  Problem Relation Age of Onset   Hyperlipidemia Mother    Hypertension Father    Heart disease Father    Hyperlipidemia Brother    Diabetes Brother    Heart failure Neg Hx      ROS:  Please see the history of present illness.     All other systems reviewed and negative.    Physical Exam: Blood pressure 113/75, pulse 62, temperature 98.7 F (37.1 C), temperature source Oral, resp. rate 20, height 5\' 8"  (1.727 m), weight 102.1 kg, SpO2 95 %. General: Well developed, well nourished male in no acute distress. Head: Normocephalic, atraumatic, sclera non-icteric, no xanthomas, nares are without discharge. EENT: normal Lymph Nodes:  none Back: without scoliosis/kyphosis , no CVA tendersness Neck: Negative for carotid bruits. JVD 8-10 Lungs: Clear bilaterally to auscultation without wheezes, rales, or rhonchi. Breathing is unlabored. Heart: RRR with S1 S2.  2/6 systolic murmur , rubs, or gallops  appreciated. Abdomen: Soft, non-tender, non-distended with normoactive bowel sounds. No hepatomegaly. No rebound/guarding. No obvious abdominal masses. Msk:  Strength and tone appear normal for age. Extremities: No clubbing or cyanosis. No  edema.  Distal pedal pulses are 2+ and equal bilaterally. Skin: Warm and Dry Neuro: Alert and oriented X 3. CN III-XII intact Grossly normal sensory and motor function . Psych:  Responds to questions appropriately with a normal affect.      Labs: Cardiac Enzymes No results for input(s): "CKTOTAL", "CKMB", "TROPONINI" in the last 72 hours. CBC Lab Results  Component Value Date   WBC 7.6 02/08/2022   HGB 13.8 02/08/2022  HCT 41.4 02/08/2022   MCV 82.5 02/08/2022   PLT 175 02/08/2022   PROTIME: No results for input(s): "LABPROT", "INR" in the last 72 hours. Chemistry  Recent Labs  Lab 02/07/22 1221 02/08/22 0520  NA 138 137  K 4.0 4.3  CL 104 106  CO2 23 23  BUN 17 18  CREATININE 1.52* 1.44*  CALCIUM 9.1 8.9  PROT 7.1  --   BILITOT 0.8  --   ALKPHOS 62  --   ALT 15  --   AST 24  --   GLUCOSE 118* 94   Lipids Lab Results  Component Value Date   CHOL 184 11/19/2014   HDL 40 (L) 11/19/2014   LDLCALC 106 (H) 11/19/2014   TRIG 188 (H) 11/19/2014   BNP No results found for: "PROBNP" Thyroid Function Tests: Recent Labs    02/07/22 1703  TSH 0.835       Telemetry sinus rhythm    EKG:   02/07/2022 atrial fibrillation  Assessment and Plan:   Atrial fibrillation with RVR  Tn elevation  suspect 2/2 shocks  PVCs frequent  NICM  CHF chronic systolic/acute class 3 with PND/orthopnea  Orthostatic LH  See separate note [place]

## 2022-02-08 NOTE — ED Notes (Signed)
Jerry foust, np on unit, examining rhythm strips, per foust, np will start po amiodarone. "We may drop his heartrate into the 40s, but I'm ok with it as long as his blood pressure is ok".

## 2022-02-08 NOTE — ED Notes (Signed)
Pt sleeping, resps unlabored.  

## 2022-02-08 NOTE — Progress Notes (Signed)
Progress Note  Patient Name: Jerry Howe Date of Encounter: 02/08/2022  Primary Cardiologist: Mariah Milling Electrophysiologist: Lalla Brothers  Subjective   No chest pain, palpitations, dizziness, presyncope, or syncope.  No current dyspnea, though has noted some dyspnea for several months leading up to his admission.  Potassium and magnesium remain at goal.  Inpatient Medications    Scheduled Meds:  vitamin C  1,000 mg Oral Daily   aspirin EC  81 mg Oral Daily   cyanocobalamin  2,000 mcg Oral Daily   metoprolol succinate  25 mg Oral Daily   pantoprazole  40 mg Oral Daily   pyridoxine  500 mg Oral Daily   sacubitril-valsartan  1 tablet Oral BID   spironolactone  25 mg Oral Daily   Continuous Infusions:  heparin 1,300 Units/hr (02/08/22 1016)   PRN Meds: acetaminophen **OR** acetaminophen, albuterol, labetalol, metoprolol tartrate, ondansetron **OR** ondansetron (ZOFRAN) IV, senna-docusate   Vital Signs    Vitals:   02/08/22 0700 02/08/22 0730 02/08/22 0754 02/08/22 1000  BP: 100/66 101/62  105/68  Pulse: (!) 54 (!) 55  (!) 58  Resp: 17 16  18   Temp:   97.6 F (36.4 C) 98.2 F (36.8 C)  TempSrc:   Oral Oral  SpO2: 97% 95%  96%  Weight:      Height:        Intake/Output Summary (Last 24 hours) at 02/08/2022 1154 Last data filed at 02/08/2022 0133 Gross per 24 hour  Intake 139.04 ml  Output --  Net 139.04 ml   Filed Weights   02/07/22 1203  Weight: 102.1 kg    Telemetry    Sinus rhythm with PVCs and NSVT - Personally Reviewed  ECG    No new tracings - Personally Reviewed  Physical Exam   GEN: No acute distress.   Neck: No JVD. Cardiac: RRR, no murmurs, rubs, or gallops.  Respiratory: Clear to auscultation bilaterally.  GI: Soft, nontender, non-distended.   MS: No edema; No deformity. Neuro:  Alert and oriented x 3; Nonfocal.  Psych: Normal affect.  Labs    Chemistry Recent Labs  Lab 02/07/22 1221 02/08/22 0520  NA 138 137  K 4.0 4.3  CL 104  106  CO2 23 23  GLUCOSE 118* 94  BUN 17 18  CREATININE 1.52* 1.44*  CALCIUM 9.1 8.9  PROT 7.1  --   ALBUMIN 3.8  --   AST 24  --   ALT 15  --   ALKPHOS 62  --   BILITOT 0.8  --   GFRNONAA 51* 54*  ANIONGAP 11 8     Hematology Recent Labs  Lab 02/07/22 1221 02/08/22 0520  WBC 7.5 7.6  RBC 5.01 5.02  HGB 13.4 13.8  HCT 41.9 41.4  MCV 83.6 82.5  MCH 26.7 27.5  MCHC 32.0 33.3  RDW 13.5 13.6  PLT 172 175    Cardiac EnzymesNo results for input(s): "TROPONINI" in the last 168 hours. No results for input(s): "TROPIPOC" in the last 168 hours.   BNP Recent Labs  Lab 02/07/22 1221  BNP 300.5*     DDimer No results for input(s): "DDIMER" in the last 168 hours.   Radiology    DG Chest Portable 1 View  Result Date: 02/07/2022 IMPRESSION: No acute finding.  Cardiomegaly. Electronically Signed   By: 02/09/2022 M.D.   On: 02/07/2022 12:13    Cardiac Studies   Interrogation pending  Patient Profile     66 y.o. 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  Assessment & Plan    1.  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  He presented in A-fib with RVR with subsequent spontaneous conversion.  Concern for possible A-fib with aberrancy and seems to be less likely VT/VF -Perform full device interrogation -Potassium and magnesium at goal -We will need to consider cardiac cath given device firing and elevated troponin -Continue current GDMT including Toprol-XL, Entresto, and spironolactone -Would look to add SGLT2 inhibitor prior to discharge  2.  Newly diagnosed A-fib: -Maintaining sinus rhythm status post spontaneous cardioversion -Heparin drip  for now -CHA2DS2-VASc at least 4 (CHF, HTN, age x1, vascular disease) -Will need to pursue [place] at discharge -TSH normal  3.  Elevated troponin: -Remote cath showed nonobstructive disease -Will need to pursue ischemic evaluation, particularly given history of exertional dyspnea, with timing to be determined pending device interrogation -Heparin gtt -ASA      For questions or updates, please contact [place] Please consult [email or web address] for contact info under Cardiology/STEMI.    Signed, Christell Faith, PA-C [place] Pager: [phone] 02/08/2022, 11:54 AM

## 2022-02-08 NOTE — Consult Note (Signed)
ANTICOAGULATION CONSULT NOTE  Pharmacy Consult for heparin infusion Indication: chest pain/ACS  Allergies  Allergen Reactions   Shellfish Allergy Anaphylaxis    Patient Measurements: Height: 5\' 8"  (172.7 cm) Weight: 102.1 kg (225 lb) IBW/kg (Calculated) : 68.4 Heparin Dosing Weight: 90.5kg  Vital Signs: Temp: 97.7 F (36.5 C) (10/22 2316) Temp Source: Oral (10/22 2316) BP: 110/69 (10/23 0530) Pulse Rate: 56 (10/23 0530)  Labs: Recent Labs    02/07/22 1221 02/07/22 1447 02/07/22 2311 02/08/22 0520  HGB 13.4  --   --  13.8  HCT 41.9  --   --  41.4  PLT 172  --   --  175  HEPARINUNFRC  --   --  0.49 0.61  CREATININE 1.52*  --   --  1.44*  TROPONINIHS 147* 702*  --   --      Estimated Creatinine Clearance: 59.2 mL/min (A) (by C-G formula based on SCr of 1.44 mg/dL (H)).   Medical History: Past Medical History:  Diagnosis Date   Anxiety    Arrhythmia    CHF (congestive heart failure) ([employer])    Hypertension    Kidney stones    Shortness of breath dyspnea     Medications:  PTA: N/A Inpatient: Heparin infusion (10/22 > ) Allergies: No AC/APT related allergies  Assessment: 66 year old male with PMH CHF, HTN, and implantable cardiac defibrillator presents to ED following referral from [facility] clinic after pacemaker debilitated 3 times in a row. Troponin I elevated at 147 followed by 702 ng/L. 12-lead EKG notes atrial fibrillation. Pharmacy consulted for management of heparin infusion in the setting of ACS.   Goal of Therapy:  Heparin level 0.3-0.7 units/ml Monitor platelets by anticoagulation protocol: Yes   Date Time aPTT/HL Rate/Comment 10/22 2311 HL 0.49 Therapeutic x 1 10/23 0520 HL 0.61 Therapeutic x 2  Plan:  Continue heparin infusion at 1300 units/hr Recheck HL daily w/ AM labs while therapeutic CBC daily while on heparin  Renda Rolls, PharmD, [facility] 02/08/2022 5:52 AM

## 2022-02-08 NOTE — ED Notes (Signed)
Pt with 16 beat run of v tach at 0150, katy foust, np notified at 0152, awaiting response. Pt awake, alert, was sleeping when incident occurred, declines chest pain, shob. See blood pressure for level immediately post incident.

## 2022-02-08 NOTE — Progress Notes (Signed)
       CROSS COVER NOTE  NAME: Jerry Howe MRN: [id number] DOB : 10/08/1955 ATTENDING PHYSICIAN: Cox, Amy N, DO    Date of Service   02/08/2022   HPI/Events of Note   Notified of 16 and 13 beat run of of Vtach BP 90/60. Mr Jerry Howe is sleeping and asymptomatic. HR is currently 52. Will start PO amiodarone at this time in lieu of IV in setting of Bradycardia and relative hypotension.  [name] is a 66 year old male with history of chronic systolic heart failure, hypertension, nonischemic cardiomyopathy, ICD status, hyperlipidemia, who presents emergency department for chief concerns of defibrillator going off 3 times while at church. His ICD was interrogated and showed  SVT initially.  It then reports 6 shocks for VT/VF   Interventions   Assessment/Plan:  Amiodarone 200 mg PO x1     This document was prepared using Dragon voice recognition software and may include unintentional dictation errors.  Neomia Glass DNP, MBA, FNP-BC Nurse Practitioner [facility] Pager [phone]

## 2022-02-09 ENCOUNTER — Inpatient Hospital Stay (HOSPITAL_COMMUNITY)
Admit: 2022-02-09 | Discharge: 2022-02-09 | Disposition: A | Discharge status: Home / Self Care | Payer: No Typology Code available for payment source | Attending: Internal Medicine | Admitting: Internal Medicine

## 2022-02-09 ENCOUNTER — Other Ambulatory Visit: Payer: Self-pay

## 2022-02-09 DIAGNOSIS — I5022 Chronic systolic (congestive) heart failure: Secondary | ICD-10-CM | POA: Diagnosis not present

## 2022-02-09 DIAGNOSIS — I480 Paroxysmal atrial fibrillation: Secondary | ICD-10-CM | POA: Diagnosis not present

## 2022-02-09 DIAGNOSIS — I428 Other cardiomyopathies: Secondary | ICD-10-CM | POA: Diagnosis not present

## 2022-02-09 DIAGNOSIS — I4891 Unspecified atrial fibrillation: Secondary | ICD-10-CM | POA: Diagnosis not present

## 2022-02-09 DIAGNOSIS — I10 Essential (primary) hypertension: Secondary | ICD-10-CM

## 2022-02-09 LAB — ECHOCARDIOGRAM COMPLETE
AR max vel: 1.49 cm2
AV Area VTI: 1.51 cm2
AV Area mean vel: 1.3 cm2
AV Mean grad: 2 mmHg
AV Peak grad: 4 mmHg
Ao pk vel: 1 m/s
Area-P 1/2: 4.96 cm2
Calc EF: 23.2 %
Height: 68 in
S' Lateral: 7 cm
Single Plane A2C EF: 27.7 %
Single Plane A4C EF: 19.9 %
Weight: 3600 oz

## 2022-02-09 LAB — HEPARIN LEVEL (UNFRACTIONATED): Heparin Unfractionated: 0.59 IU/mL (ref 0.30–0.70)

## 2022-02-09 MED ORDER — PNEUMOCOCCAL 20-VAL CONJ VACC 0.5 ML IM SUSY: 0.5000 mL | PREFILLED_SYRINGE | INTRAMUSCULAR | Status: DC | PRN

## 2022-02-09 MED ORDER — SODIUM CHLORIDE 0.9% FLUSH: 3.0000 mL | INTRAVENOUS | Status: DC | PRN

## 2022-02-09 MED ORDER — PERFLUTREN LIPID MICROSPHERE
1.0000 mL | INTRAVENOUS | Status: AC | PRN
  Administered 2022-02-09: 2 mL via INTRAVENOUS

## 2022-02-09 MED ORDER — SODIUM CHLORIDE 0.9 % IV SOLN: INTRAVENOUS | Status: DC

## 2022-02-09 MED ORDER — SODIUM CHLORIDE 0.9% FLUSH: 3.0000 mL | Freq: Two times a day (BID) | INTRAVENOUS | Status: DC

## 2022-02-09 MED ORDER — AMIODARONE HCL 200 MG PO TABS
400.0000 mg | ORAL_TABLET | Freq: Two times a day (BID) | ORAL | Status: DC
  Administered 2022-02-09 – 2022-02-13 (×9): 400 mg via ORAL
  Filled 2022-02-09 (×9): qty 2

## 2022-02-09 MED ORDER — SODIUM CHLORIDE 0.9 % IV SOLN: 250.0000 mL | INTRAVENOUS | Status: DC | PRN

## 2022-02-09 NOTE — Consult Note (Signed)
ANTICOAGULATION CONSULT NOTE  Pharmacy Consult for heparin infusion Indication: chest pain/ACS  Allergies  Allergen Reactions   Shellfish Allergy Anaphylaxis    Patient Measurements: Height: 5\' 8"  (172.7 cm) Weight: 102.1 kg (225 lb) IBW/kg (Calculated) : 68.4 Heparin Dosing Weight: 90.5kg  Vital Signs: Temp: 98.1 F (36.7 C) (10/24 0412) Temp Source: Oral (10/24 0412) BP: 93/67 (10/24 0412) Pulse Rate: 55 (10/24 0412)  Labs: Recent Labs    02/07/22 1221 02/07/22 1447 02/07/22 2311 02/08/22 0520 02/09/22 0433  HGB 13.4  --   --  13.8  --   HCT 41.9  --   --  41.4  --   PLT 172  --   --  175  --   HEPARINUNFRC  --   --  0.49 0.61 0.59  CREATININE 1.52*  --   --  1.44*  --   TROPONINIHS 147* 702*  --   --   --      Estimated Creatinine Clearance: 59.2 mL/min (A) (by C-G formula based on SCr of 1.44 mg/dL (H)).   Medical History: Past Medical History:  Diagnosis Date   Anxiety    Arrhythmia    CHF (congestive heart failure) ([place])    Hypertension    Kidney stones    Shortness of breath dyspnea     Medications:  PTA: N/A Inpatient: Heparin infusion (10/22 > ) Allergies: No AC/APT related allergies  Assessment: 66 year old male with PMH CHF, HTN, and implantable cardiac defibrillator presents to ED following referral from [facility] clinic after pacemaker debilitated 3 times in a row. Troponin I elevated at 147 followed by 702 ng/L. 12-lead EKG notes atrial fibrillation. Pharmacy consulted for management of heparin infusion in the setting of ACS.   Goal of Therapy:  Heparin level 0.3-0.7 units/ml Monitor platelets by anticoagulation protocol: Yes   Date Time aPTT/HL Rate/Comment 10/22 2311 HL 0.49 Therapeutic x 1 10/23 0520 HL 0.61 Therapeutic x 2 10/24 0433 HL 0.59 Therapeutic x 3  Plan:  Continue heparin infusion at 1300 units/hr Recheck HL daily w/ AM labs while therapeutic CBC daily while on heparin  Pearla Dubonnet, PharmD Clinical  Pharmacist 02/09/2022 5:36 AM

## 2022-02-09 NOTE — TOC CM/SW Note (Signed)
  Transition of Care [facility]) Screening Note   Patient Details  Name: TILMAN MCCLAREN Date of Birth: 1955-10-07   Transition of [facility]) CM/SW Contact:    Candie Chroman, LCSW Phone Number: 02/09/2022, 11:02 AM    Transition of Care Department [facility]) has reviewed patient and no TOC needs have been identified at this time. We will continue to monitor patient advancement through interdisciplinary progression rounds. If new patient transition needs arise, please place a TOC consult.

## 2022-02-09 NOTE — Consult Note (Signed)
  Assessment and Plan:   Atrial fibrillation with RVR  Tn elevation  suspect 2/2 shocks  ICD Medtronic   PVCs frequent  NICM  CHF chronic systolic/acute class 3 with PND/orthopnea  Orthostatic LH  Initial shock 2/2 Afib with RVR,>> resulted in an initially irregular and then regularizing tachycardia which after the 6 shocks slowed down with similar QRS morphologies across the rate change suggestive of atrial fibrillation and thus suggesting taht the rapid regular rhythm was either regularized afib or aflutter with 1:1 conduction.  If it were VT then it would be considered proarrhthymia, and then failed shocks would be a concern.  His deteriorating symptoms had been for more than a month and with the absence of palpitations at presentation or today (with PVCs) might well have been 2/2 antecedent afib which accelerated in the context of agitation that am at church  Given the afib and the frequent PVCs now notable would resume amiodarone with a intermediate term goal of cathter ablation for AFib-- He can followup with dr CL about this  Needs change of heart failure meds has as been initiated and will need to attend to orthostatic LH.  The [place] may also have been 2/2 afib or  not>> will measure orthostatic VS  He is volume overloaded but will defer other med changes to the rest of the team and post cath  We will reprogram his ICD prior to discharge, adding a zone for more discrimination and longer duration

## 2022-02-09 NOTE — Progress Notes (Signed)
*  PRELIMINARY RESULTS* Echocardiogram 2D Echocardiogram has been performed.  Jerry Howe 02/09/2022, 9:40 AM

## 2022-02-09 NOTE — Plan of Care (Signed)
Patient remains in Cardiac PCU. Plan for heart cath tomorrow AM.   Problem: Education: Goal: Understanding of CV disease, CV risk reduction, and recovery process will improve Outcome: Progressing Goal: Individualized Educational Video(s) Outcome: Progressing   Problem: Activity: Goal: Ability to return to baseline activity level will improve Outcome: Progressing   Problem: Cardiovascular: Goal: Ability to achieve and maintain adequate cardiovascular perfusion will improve Outcome: Progressing Goal: Vascular access site(s) Level 0-1 will be maintained Outcome: Progressing   Problem: Health Behavior/Discharge Planning: Goal: Ability to safely manage health-related needs after discharge will improve Outcome: Progressing   Problem: Education: Goal: Knowledge of General Education information will improve Description: Including pain rating scale, medication(s)/side effects and non-pharmacologic comfort measures Outcome: Progressing   Problem: Health Behavior/Discharge Planning: Goal: Ability to manage health-related needs will improve Outcome: Progressing   Problem: Clinical Measurements: Goal: Ability to maintain clinical measurements within normal limits will improve Outcome: Progressing Goal: Will remain free from infection Outcome: Progressing Goal: Diagnostic test results will improve Outcome: Progressing Goal: Respiratory complications will improve Outcome: Progressing Goal: Cardiovascular complication will be avoided Outcome: Progressing   Problem: Activity: Goal: Risk for activity intolerance will decrease Outcome: Progressing   Problem: Nutrition: Goal: Adequate nutrition will be maintained Outcome: Progressing   Problem: Coping: Goal: Level of anxiety will decrease Outcome: Progressing   Problem: Elimination: Goal: Will not experience complications related to bowel motility Outcome: Progressing Goal: Will not experience complications related to urinary  retention Outcome: Progressing   Problem: Pain Managment: Goal: General experience of comfort will improve Outcome: Progressing   Problem: Safety: Goal: Ability to remain free from injury will improve Outcome: Progressing   Problem: Skin Integrity: Goal: Risk for impaired skin integrity will decrease Outcome: Progressing

## 2022-02-09 NOTE — Progress Notes (Signed)
[facility] [place] NAME: Jerry Howe    MR#:  [id number]  DATE OF BIRTH:  [birth year]  SUBJECTIVE:  wife at bedside Overall uneventful day yday. NPO for Cath today    VITALS:  Blood pressure 110/70, pulse 63, temperature 97.7 F (36.5 C), resp. rate 17, height 5\' 8"  (1.727 m), weight 102.1 kg, SpO2 98 %.  PHYSICAL EXAMINATION:   GENERAL:  66 y.o.-year-old patient lying in the bed with no acute distress.  LUNGS: Normal breath sounds bilaterally, no wheezing CARDIOVASCULAR: S1, S2 normal. No murmurs,   ABDOMEN: Soft, nontender, nondistended. Bowel sounds present.  EXTREMITIES: No  edema b/l.    NEUROLOGIC: nonfocal  patient is alert and awake SKIN: No obvious rash, lesion, or ulcer.   LABORATORY PANEL:  CBC Recent Labs  Lab 02/08/22 0520  WBC 7.6  HGB 13.8  HCT 41.4  PLT 175     Chemistries  Recent Labs  Lab 02/07/22 1221 02/07/22 1703 02/08/22 0520  NA 138  --  137  K 4.0  --  4.3  CL 104  --  106  CO2 23  --  23  GLUCOSE 118*  --  94  BUN 17  --  18  CREATININE 1.52*  --  1.44*  CALCIUM 9.1  --  8.9  MG  --    < > 2.0  AST 24  --   --   ALT 15  --   --   ALKPHOS 62  --   --   BILITOT 0.8  --   --    < > = values in this interval not displayed.    Cardiac Enzymes No results for input(s): "TROPONINI" in the last 168 hours. RADIOLOGY:  No results found.  Assessment and Plan Jeris Verrill is a 66 year old male with history of chronic systolic heart failure, hypertension, nonischemic cardiomyopathy(EF 25%), ICD, hyperlipidemia, who presents emergency department for chief concerns of defibrillator going off 3 times while at church.  He established with Dr. Rockey Situ on 12/2021 an Dr. Quentin Ore on 01/2022 In the ED he was noted to be in atrial fibrillation.  He converted on his own.  He was started on IV heparin.  Per cardiology notes--MDT single chamber ICD interrogation report showed SVT initially.  It then reports  6 shocks for VT/VF.  8.2% atrial fibrillation burden    chronic combined systolic diastolic congestive heart failure secondary to non-ischemic cardiomyopathy status post ICD with device firing -- patient appears operating heart failure today. Sats stable on room air. -- Medtronic single-chamber full interrogation to be done per cardiology note -- possible a fib -- electrolyte replacement -- continue IV heparin -- continue Toprol-XL, Spironolactone and enteresto  Chest pain with elevated troponin -- history of nonobstructive disease -- continue IV heparin drip. -Elevated troponin- --cardiology plans right and left heart catheterization later today -- continue aspirin  New diagnosis of a fib -- patient currently in sinus rhythm -- on heparin drip -- will likely need oral anticoagulation at discharge defer to cardiology -- TSH stable  Hypertension -- resume home meds  CKD stage IIIa --creat 1.3-1.5  Procedures: Family communication : wife Consults : [facility] cardiology CODE STATUS: full DVT Prophylaxis : heparin drip Level of care: Progressive Status is: Inpatient Remains inpatient appropriate because: right and left heart catheterization today. Follow further cardiology recs    TOTAL TIME TAKING CARE OF THIS PATIENT: 35 minutes.  >50% time spent on counselling  and coordination of care  Note: This dictation was prepared with Dragon dictation along with smaller phrase technology. Any transcriptional errors that result from this process are unintentional.  Fritzi Mandes M.D    [facility]   CC: Primary care physician; Gladstone Lighter, MD

## 2022-02-09 NOTE — Progress Notes (Signed)
  Chaplain On-Call responded to Spiritual Care Consult Orders from [place], DO.  The requests were to offer prayer with the patient, and to provide Advance Directives information.  Chaplain met the patient and provided spiritual and emotional support and prayer.  Chaplain also provided the AD documents and education for the patient.  Chaplain Pollyann Samples M.Div., [facility]

## 2022-02-09 NOTE — Progress Notes (Signed)
Progress Note  Patient Name: Jerry Howe Date of Encounter: 02/09/2022  Primary Cardiologist: Mariah Milling Electrophysiologist: Lalla Brothers  Subjective   No chest pain, dyspnea, palpitations, dizziness, presyncope, or syncope.  No further device discharges.  Inpatient Medications    Scheduled Meds:  amiodarone  400 mg Oral BID   vitamin C  1,000 mg Oral Daily   aspirin EC  81 mg Oral Daily   cyanocobalamin  2,000 mcg Oral Daily   metoprolol succinate  25 mg Oral Daily   pantoprazole  40 mg Oral Daily   pyridoxine  500 mg Oral Daily   sacubitril-valsartan  1 tablet Oral BID   spironolactone  25 mg Oral Daily   Continuous Infusions:  sodium chloride     [START ON 02/10/2022] sodium chloride     heparin 1,300 Units/hr (02/09/22 0418)   PRN Meds: sodium chloride, acetaminophen **OR** acetaminophen, albuterol, labetalol, metoprolol tartrate, ondansetron **OR** ondansetron (ZOFRAN) IV, senna-docusate, sodium chloride flush   Vital Signs    Vitals:   02/08/22 2029 02/08/22 2342 02/09/22 0412 02/09/22 0830  BP: 127/73 100/66 93/67 110/70  Pulse: 64 (!) 56 (!) 55 63  Resp: 20 16 14 17   Temp: 97.8 F (36.6 C) 97.9 F (36.6 C) 98.1 F (36.7 C) 97.7 F (36.5 C)  TempSrc: Oral Oral Oral   SpO2: 100% 97% 99% 98%  Weight:      Height:       No intake or output data in the 24 hours ending 02/09/22 0925 Filed Weights   02/07/22 1203  Weight: 102.1 kg    Telemetry    SR with PVCs - Personally Reviewed  ECG    No new tracings - Personally Reviewed  Physical Exam   GEN: No acute distress.   Neck: No JVD. Cardiac: RRR, no murmurs, rubs, or gallops.  Respiratory: Clear to auscultation bilaterally.  GI: Soft, nontender, non-distended.   MS: No edema; No deformity. Neuro:  Alert and oriented x 3; Nonfocal.  Psych: Normal affect.  Labs    Chemistry Recent Labs  Lab 02/07/22 1221 02/08/22 0520  NA 138 137  K 4.0 4.3  CL 104 106  CO2 23 23  GLUCOSE 118* 94  BUN  17 18  CREATININE 1.52* 1.44*  CALCIUM 9.1 8.9  PROT 7.1  --   ALBUMIN 3.8  --   AST 24  --   ALT 15  --   ALKPHOS 62  --   BILITOT 0.8  --   GFRNONAA 51* 54*  ANIONGAP 11 8     Hematology Recent Labs  Lab 02/07/22 1221 02/08/22 0520  WBC 7.5 7.6  RBC 5.01 5.02  HGB 13.4 13.8  HCT 41.9 41.4  MCV 83.6 82.5  MCH 26.7 27.5  MCHC 32.0 33.3  RDW 13.5 13.6  PLT 172 175    Cardiac EnzymesNo results for input(s): "TROPONINI" in the last 168 hours. No results for input(s): "TROPIPOC" in the last 168 hours.   BNP Recent Labs  Lab 02/07/22 1221  BNP 300.5*     DDimer No results for input(s): "DDIMER" in the last 168 hours.   Radiology    DG Chest Portable 1 View  Result Date: 02/07/2022 IMPRESSION: No acute finding.  Cardiomegaly. Electronically Signed   By: 02/09/2022 M.D.   On: 02/07/2022 12:13    Cardiac Studies   See scanned in report for interrogation  Patient Profile     66 y.o. male with history of chronic combined systolic  and diastolic CHF secondary to NICM status post ICD and HTN who we are evaluating for ICD shocks and newly diagnosed A-fib.  Assessment & Plan    1. Chronic combined systolic and diastolic CHF secondary to NICM status post ICD with device firing: -Euvolemic on exam, further assess hemodynamics with RHC later today -Patient with Medtronic single chamber ICD that fired 6 times on 02/07/2022 with written report showing it was for VT/VF.  He presented in A-fib with RVR with subsequent spontaneous conversion -Evaluated by EP this morning with initial shock felt to be secondary to A-fib with RVR resulting in initially irregular than regularizing tachycardia suggestive of A-fib or a flutter with one-to-one conduction -EP has resumed amiodarone with continuation of Toprol-XL -EP plans to reprogram his ICD prior to discharge -Potassium and magnesium at goal -Pursue [facility] later today -Continue current GDMT including Toprol-XL, Entresto, and  spironolactone -Would look to add SGLT2 inhibitor prior to discharge   2.  Newly diagnosed A-fib: -Maintaining sinus rhythm status post spontaneous cardioversion -Heparin drip for now -CHA2DS2-VASc at least 4 (CHF, HTN, age x1, vascular disease) -Will need to pursue [place] at discharge -TSH normal   3.  Elevated troponin: -Remote cath showed nonobstructive disease -N.p.o. -R/LHC this afternoon -Heparin gtt -ASA   Shared Decision Making/Informed Consent{  The risks [stroke (1 in 1000), death (1 in 1000), kidney failure [usually temporary] (1 in 500), bleeding (1 in 200), allergic reaction [possibly serious] (1 in 200)], benefits (diagnostic support and management of coronary artery disease) and alternatives of a cardiac catheterization were discussed in detail with Mr. Brunty and he is willing to proceed.   For questions or updates, please contact [place] Please consult [email or web address] for contact info under Cardiology/STEMI.    Signed, Christell Faith, PA-C [place] Pager: [phone] 02/09/2022, 9:25 AM

## 2022-02-09 NOTE — Plan of Care (Signed)

## 2022-02-10 ENCOUNTER — Encounter: Payer: Self-pay | Admitting: Internal Medicine

## 2022-02-10 ENCOUNTER — Encounter
Admission: EM | Disposition: A | Discharge status: Home / Self Care | Payer: Self-pay | Source: Home / Self Care | Attending: Internal Medicine

## 2022-02-10 ENCOUNTER — Telehealth: Payer: Self-pay | Admitting: Cardiology

## 2022-02-10 DIAGNOSIS — I214 Non-ST elevation (NSTEMI) myocardial infarction: Secondary | ICD-10-CM | POA: Diagnosis not present

## 2022-02-10 DIAGNOSIS — R7989 Other specified abnormal findings of blood chemistry: Secondary | ICD-10-CM

## 2022-02-10 DIAGNOSIS — I5022 Chronic systolic (congestive) heart failure: Secondary | ICD-10-CM | POA: Diagnosis not present

## 2022-02-10 DIAGNOSIS — I10 Essential (primary) hypertension: Secondary | ICD-10-CM | POA: Diagnosis not present

## 2022-02-10 DIAGNOSIS — I480 Paroxysmal atrial fibrillation: Secondary | ICD-10-CM | POA: Diagnosis not present

## 2022-02-10 DIAGNOSIS — I272 Pulmonary hypertension, unspecified: Secondary | ICD-10-CM | POA: Diagnosis not present

## 2022-02-10 DIAGNOSIS — I428 Other cardiomyopathies: Secondary | ICD-10-CM | POA: Diagnosis not present

## 2022-02-10 HISTORY — PX: RIGHT/LEFT HEART CATH AND CORONARY ANGIOGRAPHY: CATH118266

## 2022-02-10 LAB — POCT I-STAT EG7
Acid-base deficit: 1 mmol/L (ref 0.0–2.0)
Bicarbonate: 23.6 mmol/L (ref 20.0–28.0)
Calcium, Ion: 1.26 mmol/L (ref 1.15–1.40)
HCT: 45 % (ref 39.0–52.0)
Hemoglobin: 15.3 g/dL (ref 13.0–17.0)
O2 Saturation: 67 %
Potassium: 4.4 mmol/L (ref 3.5–5.1)
Sodium: 135 mmol/L (ref 135–145)
TCO2: 25 mmol/L (ref 22–32)
pCO2, Ven: 37.8 mmHg — ABNORMAL LOW (ref 44–60)
pH, Ven: 7.402 (ref 7.25–7.43)
pO2, Ven: 35 mmHg (ref 32–45)

## 2022-02-10 LAB — CBC
HCT: 43.6 % (ref 39.0–52.0)
Hemoglobin: 14.5 g/dL (ref 13.0–17.0)
MCH: 27.2 pg (ref 26.0–34.0)
MCHC: 33.3 g/dL (ref 30.0–36.0)
MCV: 81.8 fL (ref 80.0–100.0)
Platelets: 185 10*3/uL (ref 150–400)
RBC: 5.33 MIL/uL (ref 4.22–5.81)
RDW: 13.4 % (ref 11.5–15.5)
WBC: 8.3 10*3/uL (ref 4.0–10.5)
nRBC: 0 % (ref 0.0–0.2)

## 2022-02-10 LAB — POCT I-STAT 7, (LYTES, BLD GAS, ICA,H+H)
Acid-base deficit: 1 mmol/L (ref 0.0–2.0)
Bicarbonate: 22.6 mmol/L (ref 20.0–28.0)
Calcium, Ion: 1.26 mmol/L (ref 1.15–1.40)
HCT: 45 % (ref 39.0–52.0)
Hemoglobin: 15.3 g/dL (ref 13.0–17.0)
O2 Saturation: 96 %
Potassium: 4.5 mmol/L (ref 3.5–5.1)
Sodium: 134 mmol/L — ABNORMAL LOW (ref 135–145)
TCO2: 24 mmol/L (ref 22–32)
pCO2 arterial: 32.7 mmHg (ref 32–48)
pH, Arterial: 7.447 (ref 7.35–7.45)
pO2, Arterial: 79 mmHg — ABNORMAL LOW (ref 83–108)

## 2022-02-10 LAB — HEPARIN LEVEL (UNFRACTIONATED): Heparin Unfractionated: 0.75 IU/mL — ABNORMAL HIGH (ref 0.30–0.70)

## 2022-02-10 SURGERY — RIGHT/LEFT HEART CATH AND CORONARY ANGIOGRAPHY
Anesthesia: Moderate Sedation

## 2022-02-10 MED ORDER — FUROSEMIDE 10 MG/ML IJ SOLN
40.0000 mg | Freq: Two times a day (BID) | INTRAMUSCULAR | Status: DC
  Administered 2022-02-10 – 2022-02-11 (×3): 40 mg via INTRAVENOUS
  Filled 2022-02-10 (×3): qty 4

## 2022-02-10 MED ORDER — VERAPAMIL HCL 2.5 MG/ML IV SOLN
INTRAVENOUS | Status: AC
  Filled 2022-02-10: qty 2

## 2022-02-10 MED ORDER — LIDOCAINE HCL 1 % IJ SOLN
INTRAMUSCULAR | Status: AC
  Filled 2022-02-10: qty 20

## 2022-02-10 MED ORDER — HEPARIN (PORCINE) IN NACL 1000-0.9 UT/500ML-% IV SOLN
INTRAVENOUS | Status: DC | PRN
  Administered 2022-02-10 (×2): 500 mL

## 2022-02-10 MED ORDER — HEPARIN (PORCINE) IN NACL 1000-0.9 UT/500ML-% IV SOLN
INTRAVENOUS | Status: AC
  Filled 2022-02-10: qty 1000

## 2022-02-10 MED ORDER — HYDRALAZINE HCL 20 MG/ML IJ SOLN: 10.0000 mg | INTRAMUSCULAR | Status: AC | PRN

## 2022-02-10 MED ORDER — LIDOCAINE HCL (PF) 1 % IJ SOLN
INTRAMUSCULAR | Status: DC | PRN
  Administered 2022-02-10 (×2): 2 mL

## 2022-02-10 MED ORDER — SODIUM CHLORIDE 0.9% FLUSH: 3.0000 mL | INTRAVENOUS | Status: DC | PRN

## 2022-02-10 MED ORDER — MIDAZOLAM HCL 2 MG/2ML IJ SOLN
INTRAMUSCULAR | Status: AC
  Filled 2022-02-10: qty 2

## 2022-02-10 MED ORDER — FENTANYL CITRATE (PF) 100 MCG/2ML IJ SOLN
INTRAMUSCULAR | Status: AC
  Filled 2022-02-10: qty 2

## 2022-02-10 MED ORDER — FENTANYL CITRATE (PF) 100 MCG/2ML IJ SOLN
INTRAMUSCULAR | Status: DC | PRN
  Administered 2022-02-10: 12.5 ug via INTRAVENOUS

## 2022-02-10 MED ORDER — IOHEXOL 300 MG/ML  SOLN
INTRAMUSCULAR | Status: DC | PRN
  Administered 2022-02-10: 28 mL

## 2022-02-10 MED ORDER — MIDAZOLAM HCL 2 MG/2ML IJ SOLN
INTRAMUSCULAR | Status: DC | PRN
  Administered 2022-02-10: .5 mg via INTRAVENOUS

## 2022-02-10 MED ORDER — ORAL CARE MOUTH RINSE: 15.0000 mL | OROMUCOSAL | Status: DC | PRN

## 2022-02-10 MED ORDER — HEPARIN SODIUM (PORCINE) 1000 UNIT/ML IJ SOLN
INTRAMUSCULAR | Status: AC
  Filled 2022-02-10: qty 10

## 2022-02-10 MED ORDER — SODIUM CHLORIDE 0.9 % IV SOLN: 250.0000 mL | INTRAVENOUS | Status: DC | PRN

## 2022-02-10 MED ORDER — HEPARIN SODIUM (PORCINE) 1000 UNIT/ML IJ SOLN
INTRAMUSCULAR | Status: DC | PRN
  Administered 2022-02-10: 5000 [IU] via INTRAVENOUS

## 2022-02-10 MED ORDER — VERAPAMIL HCL 2.5 MG/ML IV SOLN
INTRAVENOUS | Status: DC | PRN
  Administered 2022-02-10 (×2): 2.5 mg via INTRA_ARTERIAL

## 2022-02-10 MED ORDER — HEPARIN (PORCINE) 25000 UT/250ML-% IV SOLN
1200.0000 [IU]/h | INTRAVENOUS | Status: DC
  Administered 2022-02-10: 1200 [IU]/h via INTRAVENOUS
  Filled 2022-02-10: qty 250

## 2022-02-10 MED ORDER — SODIUM CHLORIDE 0.9% FLUSH
3.0000 mL | Freq: Two times a day (BID) | INTRAVENOUS | Status: DC
  Administered 2022-02-10 – 2022-02-13 (×5): 3 mL via INTRAVENOUS

## 2022-02-10 SURGICAL SUPPLY — 14 items
BAND ZEPHYR COMPRESS 30 LONG (HEMOSTASIS) IMPLANT
CATH 5FR JL3.5 JR4 ANG PIG MP (CATHETERS) IMPLANT
CATH BALLN WEDGE 5F 110CM (CATHETERS) IMPLANT
DRAPE BRACHIAL (DRAPES) IMPLANT
GLIDESHEATH SLEND SS 6F .021 (SHEATH) IMPLANT
GUIDEWIRE EMER 3M J .025X150CM (WIRE) IMPLANT
GUIDEWIRE INQWIRE 1.5J.035X260 (WIRE) IMPLANT
INQWIRE 1.5J .035X260CM (WIRE) ×1
KIT SYRINGE INJ CVI SPIKEX1 (MISCELLANEOUS) IMPLANT
PACK CARDIAC CATH (CUSTOM PROCEDURE TRAY) ×1 IMPLANT
PROTECTION STATION PRESSURIZED (MISCELLANEOUS) ×1
SET ATX SIMPLICITY (MISCELLANEOUS) IMPLANT
SHEATH GLIDE SLENDER 4/5FR (SHEATH) IMPLANT
STATION PROTECTION PRESSURIZED (MISCELLANEOUS) IMPLANT

## 2022-02-10 NOTE — Telephone Encounter (Signed)
-----   Message from Vickie Epley, MD sent at 02/10/2022  5:53 AM EDT ----- Can we make sure Mr Ly has a follow up with me to discuss AF ablation at some point in the next 1-2 months? Thanks, Lysbeth Galas

## 2022-02-10 NOTE — Care Management Important Message (Signed)
Important Message  Patient Details  Name: Jerry Howe MRN: [id number] Date of Birth: Dec 25, 1955   Medicare Important Message Given:  Yes     Dannette Barbara 02/10/2022, 9:25 AM

## 2022-02-10 NOTE — Progress Notes (Signed)
  Progress Note   Patient: Jerry Howe KMM:[id number] DOB: November 20, 1955 DOA: 02/07/2022     3 DOS: the patient was seen and examined on 02/10/2022   Brief hospital course: [name] is a 66 year old male with history of chronic systolic heart failure, hypertension, nonischemic cardiomyopathy, ICD status, hyperlipidemia, who presents emergency department for chief concerns of defibrillator going off 3 times while at church.  Initial vitals in the ED showed temperature of 97.9, respiration rate of 14, heart rate of 96, blood pressure 113/72, SPO2 of 97% on room air.  Serum sodium is 138, potassium 4.0, chloride 104, bicarb 23, BUN of 17, serum creatinine of 1.52, GFR 51, nonfasting blood glucose 118, WBC 7.5, hemoglobin 13.4, platelets of 172, high sensitive troponin is 147.  EDP consulted cardiology, who are aware of the patient, Dr. Jonelle Sidle.  ED treatment: None  10/25: Cath planned for today   Assessment and Plan: * NSTEMI (non-ST elevated myocardial infarction) ([place]) With nonsustained V. tach.  No chest pain - Initial troponin was 147 and on repeat is 702 - Continue heparin GTT  - Right and left heart cath planned for today  Paroxysmal A-fib (HCC) - Now in sinus rhythm.  Continue amiodarone 400 mg p.o. twice daily.  Continue heparin.  Likely transition to Eliquis at discharge  Elevated troponin - Pending repeat - We will continue to monitor  V tach ([place]) - Per Medtronic interrogated, readings uploaded to epic media  [facility] cardiology seen and appreciate input -Continue amiodarone, Toprol XL, Entresto and Aldactone Cardiac cath planned for today  NICM (nonischemic cardiomyopathy) ([place]) Left and right heart cath planned for today  HTN (hypertension) - Continue Toprol-XL, Entresto, Aldactone  Chronic HFrEF (heart failure with reduced ejection fraction) ([place]) Echo showed EF of 20 to 25% Cath planned for today        Subjective: Denies any new symptoms.   Thankful for his device/AICD to give him shock even though he felt like earthquake when he got shocked.  Sitting in the bed without any complaints  Physical Exam: Vitals:   02/10/22 1315 02/10/22 1320 02/10/22 1330 02/10/22 1345  BP: 102/61  97/66 98/71  Pulse: (!) 50 (!) 56 (!) 47 (!) 55  Resp: 13 11 15 15   Temp:      TempSrc:      SpO2: 92% 94% 96% 96%  Weight:      Height:       66 year old male sitting in the bed comfortably without any acute distress Lungs clear to auscultation bilaterally Cardiovascular regular rate and rhythm Abdomen soft, benign Neuro alert and oriented, nonfocal Skin no rash or lesion Data Reviewed:  Cath shows severe pulmonary hypertension.  No major obstructive coronary disease  Family Communication: Wife updated at bedside  Disposition: Status is: Inpatient Remains inpatient appropriate because: Cardiac work-up   Planned Discharge Destination: Home    DVT prophylaxis-heparin drip Time spent: 35 minutes  Author: Max Sane, MD 02/10/2022 5:38 PM  For on call review [email or web address].

## 2022-02-10 NOTE — Assessment & Plan Note (Signed)
Left and right heart cath planned for today

## 2022-02-10 NOTE — H&P (View-Only) (Signed)
Progress Note  Patient Name: Jerry Howe Date of Encounter: 02/10/2022  Primary Cardiologist: Rockey Situ Electrophysiologist: Quentin Ore  Subjective   No chest pain, dyspnea, palpitations, dizziness, presyncope, or syncope.  No further device discharges. He is for [facility] today.   Inpatient Medications    Scheduled Meds:  amiodarone  400 mg Oral BID   vitamin C  1,000 mg Oral Daily   aspirin EC  81 mg Oral Daily   cyanocobalamin  2,000 mcg Oral Daily   metoprolol succinate  25 mg Oral Daily   pantoprazole  40 mg Oral Daily   pyridoxine  500 mg Oral Daily   sacubitril-valsartan  1 tablet Oral BID   sodium chloride flush  3 mL Intravenous Q12H   spironolactone  25 mg Oral Daily   Continuous Infusions:  sodium chloride     sodium chloride 10 mL/hr at 02/10/22 0600   heparin 1,300 Units/hr (02/10/22 0600)   PRN Meds: sodium chloride, acetaminophen **OR** acetaminophen, albuterol, labetalol, metoprolol tartrate, ondansetron **OR** ondansetron (ZOFRAN) IV, mouth rinse, pneumococcal 20-valent conjugate vaccine, senna-docusate, sodium chloride flush   Vital Signs    Vitals:   02/09/22 2030 02/09/22 2350 02/10/22 0420 02/10/22 0732  BP: 104/64 98/63 107/62 (!) 107/57  Pulse: (!) 57 (!) 56 (!) 52 (!) 56  Resp: 16 14 16    Temp: 97.7 F (36.5 C) 98.4 F (36.9 C) (!) 97.5 F (36.4 C) 98 F (36.7 C)  TempSrc: Oral Oral Oral Oral  SpO2: 100% 100% 100% 100%  Weight:      Height:        Intake/Output Summary (Last 24 hours) at 02/10/2022 0814 Last data filed at 02/10/2022 0600 Gross per 24 hour  Intake 978.11 ml  Output --  Net 978.11 ml   Filed Weights   02/07/22 1203  Weight: 102.1 kg    Telemetry    SR with PVCs and short run of NSVT- Personally Reviewed  ECG    No new tracings - Personally Reviewed  Physical Exam   GEN: No acute distress.   Neck: No JVD. Cardiac: RRR, no murmurs, rubs, or gallops.  Respiratory: Clear to auscultation bilaterally.  GI:  Soft, nontender, non-distended.   MS: No edema; No deformity. Neuro:  Alert and oriented x 3; Nonfocal.  Psych: Normal affect.  Labs    Chemistry Recent Labs  Lab 02/07/22 1221 02/08/22 0520  NA 138 137  K 4.0 4.3  CL 104 106  CO2 23 23  GLUCOSE 118* 94  BUN 17 18  CREATININE 1.52* 1.44*  CALCIUM 9.1 8.9  PROT 7.1  --   ALBUMIN 3.8  --   AST 24  --   ALT 15  --   ALKPHOS 62  --   BILITOT 0.8  --   GFRNONAA 51* 54*  ANIONGAP 11 8      Hematology Recent Labs  Lab 02/07/22 1221 02/08/22 0520 02/10/22 0610  WBC 7.5 7.6 8.3  RBC 5.01 5.02 5.33  HGB 13.4 13.8 14.5  HCT 41.9 41.4 43.6  MCV 83.6 82.5 81.8  MCH 26.7 27.5 27.2  MCHC 32.0 33.3 33.3  RDW 13.5 13.6 13.4  PLT 172 175 185     Cardiac EnzymesNo results for input(s): "TROPONINI" in the last 168 hours. No results for input(s): "TROPIPOC" in the last 168 hours.   BNP Recent Labs  Lab 02/07/22 1221  BNP 300.5*      DDimer No results for input(s): "DDIMER" in the last 168 hours.  Radiology    DG Chest Portable 1 View  Result Date: 02/07/2022 IMPRESSION: No acute finding.  Cardiomegaly. Electronically Signed   By: Tiburcio Pea M.D.   On: 02/07/2022 12:13    Cardiac Studies   See scanned in report for interrogation. __________  2D echo 02/09/2022: 1. Left ventricular ejection fraction, by estimation, is 20 to 25%. The  left ventricle has severely decreased function. The left ventricle  demonstrates global hypokinesis. The left ventricular internal cavity size  was severely dilated. There is mild  left ventricular hypertrophy. Left ventricular diastolic parameters are  consistent with Grade III diastolic dysfunction (restrictive). Elevated  left atrial pressure.   2. Right ventricular systolic function is normal. The right ventricular  size is normal. There is normal pulmonary artery systolic pressure.   3. Left atrial size was moderately dilated.   4. Right atrial size was mildly  dilated.   5. A small pericardial effusion is present. The pericardial effusion is  circumferential.   6. The mitral valve is normal in structure. Mild to moderate mitral valve  regurgitation.   7. The aortic valve is tricuspid. There is mild thickening of the aortic  valve. Aortic valve regurgitation is mild. Aortic valve sclerosis is  present, with no evidence of aortic valve stenosis.   8. The inferior vena cava is normal in size with <50% respiratory  variability, suggesting right atrial pressure of 8 mmHg.  Patient Profile     66 y.o. 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  Assessment & Plan    1. Chronic combined systolic and diastolic CHF secondary to NICM status post ICD with device firing: -Euvolemic on exam, further assess hemodynamics with RHC later today -Patient with Medtronic single chamber ICD that fired 6 times on 02/07/2022 with written report showing it was for VT/VF.  He presented in A-fib with RVR with subsequent spontaneous conversion -Evaluated by EP this admission with initial shock felt to be secondary to A-fib with RVR resulting in initially irregular than regularizing tachycardia suggestive of A-fib or a flutter with one-to-one conduction -EP has resumed amiodarone with continuation of Toprol-XL -EP plans to reprogram his ICD prior to discharge -Potassium and magnesium at goal -Pursue R/LHC today -Continue current GDMT including Toprol-XL, Entresto, and spironolactone -Would look to add SGLT2 inhibitor prior to discharge   2.  Newly diagnosed A-fib: -Maintaining sinus rhythm status post spontaneous cardioversion -Heparin drip for now -CHA2DS2-VASc at least 4 (CHF, HTN, age x1, vascular disease) -Will need to pursue [facility] at discharge -TSH normal   3.  Elevated troponin: -Remote cath showed nonobstructive  disease -N.p.o. -R/LHC this afternoon (delayed yesterday due to emergency requiring cath lab usage) -Heparin gtt -ASA   Shared Decision Making/Informed Consent{  The risks [stroke (1 in 1000), death (1 in 1000), kidney failure [usually temporary] (1 in 500), bleeding (1 in 200), allergic reaction [possibly serious] (1 in 200)], benefits (diagnostic support and management of coronary artery disease) and alternatives of a cardiac catheterization were discussed in detail with Mr. Salzwedel and he is willing to proceed.   For questions or updates, please contact [facility] Please consult [email or web address] for contact info under Cardiology/STEMI.    Signed, Eula Listen, PA-C [facility] Pager: [phone] 02/10/2022, 8:14 AM

## 2022-02-10 NOTE — Consult Note (Signed)
ANTICOAGULATION CONSULT NOTE  Pharmacy Consult for heparin infusion Indication: Afib  Allergies  Allergen Reactions   Shellfish Allergy Anaphylaxis    Patient Measurements: Height: 5\' 8"  (172.7 cm) Weight: 102.1 kg (225 lb 1.4 oz) IBW/kg (Calculated) : 68.4 Heparin Dosing Weight: 90.5kg  Vital Signs: Temp: 98.3 F (36.8 C) (10/25 1050) Temp Source: Oral (10/25 1050) BP: 98/71 (10/25 1345) Pulse Rate: 55 (10/25 1345)  Labs: Recent Labs    02/08/22 0520 02/09/22 0433 02/10/22 0610 02/10/22 1233 02/10/22 1236  HGB 13.8  --  14.5 15.3 15.3  HCT 41.4  --  43.6 45.0 45.0  PLT 175  --  185  --   --   HEPARINUNFRC 0.61 0.59 0.75*  --   --   CREATININE 1.44*  --   --   --   --      Estimated Creatinine Clearance: 59.2 mL/min (A) (by C-G formula based on SCr of 1.44 mg/dL (H)).   Medical History: Past Medical History:  Diagnosis Date   Anxiety    Arrhythmia    CHF (congestive heart failure) ([place])    Hypertension    Kidney stones    Shortness of breath dyspnea     Medications:  PTA: N/A Inpatient: Heparin infusion (10/22 > ) Allergies: No AC/APT related allergies  Assessment: 66 year old male with PMH CHF, HTN, and implantable cardiac defibrillator presents to ED following referral from [facility] clinic after pacemaker debilitated 3 times in a row. Troponin I elevated at 147 followed by 702 ng/L. 12-lead EKG notes atrial fibrillation. Pharmacy consulted for management of heparin infusion in the setting of Afib.  Goal of Therapy:  Heparin level 0.3-0.7 units/ml Monitor platelets by anticoagulation protocol: Yes   Date Time aPTT/HL Rate/Comment 10/22 2311 HL 0.49 Therapeutic x 1 10/23 0520 HL 0.61 Therapeutic x 2 10/24 0433 HL 0.59 Therapeutic x 3 10/25 0610 HL 0.75 SUPRAtherapeutic   Plan:  Resume heparin infusion at 1200 units/hr 2 hours after arterial band removal (10/25 @ 1700) Recheck HL in 6 hours after rate change CBC daily while on heparin  Lorin Picket, PharmD Clinical Pharmacist 02/10/2022 3:01 PM

## 2022-02-10 NOTE — Consult Note (Signed)
ANTICOAGULATION CONSULT NOTE  Pharmacy Consult for heparin infusion Indication: chest pain/ACS  Allergies  Allergen Reactions   Shellfish Allergy Anaphylaxis    Patient Measurements: Height: 5\' 8"  (172.7 cm) Weight: 102.1 kg (225 lb) IBW/kg (Calculated) : 68.4 Heparin Dosing Weight: 90.5kg  Vital Signs: Temp: 97.5 F (36.4 C) (10/25 0420) Temp Source: Oral (10/25 0420) BP: 107/62 (10/25 0420) Pulse Rate: 52 (10/25 0420)  Labs: Recent Labs    02/07/22 1221 02/07/22 1447 02/07/22 2311 02/08/22 0520 02/09/22 0433 02/10/22 0610  HGB 13.4  --   --  13.8  --  14.5  HCT 41.9  --   --  41.4  --  43.6  PLT 172  --   --  175  --  185  HEPARINUNFRC  --   --    < > 0.61 0.59 0.75*  CREATININE 1.52*  --   --  1.44*  --   --   TROPONINIHS 147* 702*  --   --   --   --    < > = values in this interval not displayed.     Estimated Creatinine Clearance: 59.2 mL/min (A) (by C-G formula based on SCr of 1.44 mg/dL (H)).   Medical History: Past Medical History:  Diagnosis Date   Anxiety    Arrhythmia    CHF (congestive heart failure) ([place])    Hypertension    Kidney stones    Shortness of breath dyspnea     Medications:  PTA: N/A Inpatient: Heparin infusion (10/22 > ) Allergies: No AC/APT related allergies  Assessment: 66 year old male with PMH CHF, HTN, and implantable cardiac defibrillator presents to ED following referral from [facility] clinic after pacemaker debilitated 3 times in a row. Troponin I elevated at 147 followed by 702 ng/L. 12-lead EKG notes atrial fibrillation. Pharmacy consulted for management of heparin infusion in the setting of ACS.   Goal of Therapy:  Heparin level 0.3-0.7 units/ml Monitor platelets by anticoagulation protocol: Yes   Date Time aPTT/HL Rate/Comment 10/22 2311 HL 0.49 Therapeutic x 1 10/23 0520 HL 0.61 Therapeutic x 2 10/24 0433 HL 0.59 Therapeutic x 3 10/25 0610 HL 0.75 SUPRAtherapeutic   Plan:  Decrease heparin infusion to 1200  units/hr Recheck HL in 8 hours after rate change CBC daily while on heparin  Pearla Dubonnet, PharmD Clinical Pharmacist 02/10/2022 7:02 AM

## 2022-02-10 NOTE — Telephone Encounter (Signed)
LVM to schedule appt with Dr. Quentin Ore to discuss Ablation

## 2022-02-10 NOTE — Assessment & Plan Note (Signed)
Echo showed EF of 20 to 25% Cath planned for today

## 2022-02-10 NOTE — Interval H&P Note (Signed)
History and Physical Interval Note:  02/10/2022 12:10 PM  Jerry Howe  has presented today for surgery, with the diagnosis of NSTEMI, chronic HFrEF, and VT.  The various methods of treatment have been discussed with the patient and family. After consideration of risks, benefits and other options for treatment, the patient has consented to  Procedure(s): RIGHT/LEFT HEART CATH AND CORONARY ANGIOGRAPHY (N/A) as a surgical intervention.  The patient's history has been reviewed, patient examined, no change in status, stable for surgery.  I have reviewed the patient's chart and labs.  Questions were answered to the patient's satisfaction.    Cath Lab Visit (complete for each Cath Lab visit)  Clinical Evaluation Leading to the Procedure:   ACS: Yes.    Non-ACS:  N/A  Shaunessy Dobratz

## 2022-02-10 NOTE — Progress Notes (Signed)
Progress Note  Patient Name: Jerry Howe Date of Encounter: 02/10/2022  Primary Cardiologist: Rockey Situ Electrophysiologist: Quentin Ore  Subjective   No chest pain, dyspnea, palpitations, dizziness, presyncope, or syncope.  No further device discharges. He is for [facility] today.   Inpatient Medications    Scheduled Meds:  amiodarone  400 mg Oral BID   vitamin C  1,000 mg Oral Daily   aspirin EC  81 mg Oral Daily   cyanocobalamin  2,000 mcg Oral Daily   metoprolol succinate  25 mg Oral Daily   pantoprazole  40 mg Oral Daily   pyridoxine  500 mg Oral Daily   sacubitril-valsartan  1 tablet Oral BID   sodium chloride flush  3 mL Intravenous Q12H   spironolactone  25 mg Oral Daily   Continuous Infusions:  sodium chloride     sodium chloride 10 mL/hr at 02/10/22 0600   heparin 1,300 Units/hr (02/10/22 0600)   PRN Meds: sodium chloride, acetaminophen **OR** acetaminophen, albuterol, labetalol, metoprolol tartrate, ondansetron **OR** ondansetron (ZOFRAN) IV, mouth rinse, pneumococcal 20-valent conjugate vaccine, senna-docusate, sodium chloride flush   Vital Signs    Vitals:   02/09/22 2030 02/09/22 2350 02/10/22 0420 02/10/22 0732  BP: 104/64 98/63 107/62 (!) 107/57  Pulse: (!) 57 (!) 56 (!) 52 (!) 56  Resp: 16 14 16    Temp: 97.7 F (36.5 C) 98.4 F (36.9 C) (!) 97.5 F (36.4 C) 98 F (36.7 C)  TempSrc: Oral Oral Oral Oral  SpO2: 100% 100% 100% 100%  Weight:      Height:        Intake/Output Summary (Last 24 hours) at 02/10/2022 0814 Last data filed at 02/10/2022 0600 Gross per 24 hour  Intake 978.11 ml  Output --  Net 978.11 ml   Filed Weights   02/07/22 1203  Weight: 102.1 kg    Telemetry    SR with PVCs and short run of NSVT- Personally Reviewed  ECG    No new tracings - Personally Reviewed  Physical Exam   GEN: No acute distress.   Neck: No JVD. Cardiac: RRR, no murmurs, rubs, or gallops.  Respiratory: Clear to auscultation bilaterally.  GI:  Soft, nontender, non-distended.   MS: No edema; No deformity. Neuro:  Alert and oriented x 3; Nonfocal.  Psych: Normal affect.  Labs    Chemistry Recent Labs  Lab 02/07/22 1221 02/08/22 0520  NA 138 137  K 4.0 4.3  CL 104 106  CO2 23 23  GLUCOSE 118* 94  BUN 17 18  CREATININE 1.52* 1.44*  CALCIUM 9.1 8.9  PROT 7.1  --   ALBUMIN 3.8  --   AST 24  --   ALT 15  --   ALKPHOS 62  --   BILITOT 0.8  --   GFRNONAA 51* 54*  ANIONGAP 11 8      Hematology Recent Labs  Lab 02/07/22 1221 02/08/22 0520 02/10/22 0610  WBC 7.5 7.6 8.3  RBC 5.01 5.02 5.33  HGB 13.4 13.8 14.5  HCT 41.9 41.4 43.6  MCV 83.6 82.5 81.8  MCH 26.7 27.5 27.2  MCHC 32.0 33.3 33.3  RDW 13.5 13.6 13.4  PLT 172 175 185     Cardiac EnzymesNo results for input(s): "TROPONINI" in the last 168 hours. No results for input(s): "TROPIPOC" in the last 168 hours.   BNP Recent Labs  Lab 02/07/22 1221  BNP 300.5*      DDimer No results for input(s): "DDIMER" in the last 168 hours.  Radiology    DG Chest Portable 1 View  Result Date: 02/07/2022 IMPRESSION: No acute finding.  Cardiomegaly. Electronically Signed   By: Tiburcio Pea M.D.   On: 02/07/2022 12:13    Cardiac Studies   See scanned in report for interrogation. __________  2D echo 02/09/2022: 1. Left ventricular ejection fraction, by estimation, is 20 to 25%. The  left ventricle has severely decreased function. The left ventricle  demonstrates global hypokinesis. The left ventricular internal cavity size  was severely dilated. There is mild  left ventricular hypertrophy. Left ventricular diastolic parameters are  consistent with Grade III diastolic dysfunction (restrictive). Elevated  left atrial pressure.   2. Right ventricular systolic function is normal. The right ventricular  size is normal. There is normal pulmonary artery systolic pressure.   3. Left atrial size was moderately dilated.   4. Right atrial size was mildly  dilated.   5. A small pericardial effusion is present. The pericardial effusion is  circumferential.   6. The mitral valve is normal in structure. Mild to moderate mitral valve  regurgitation.   7. The aortic valve is tricuspid. There is mild thickening of the aortic  valve. Aortic valve regurgitation is mild. Aortic valve sclerosis is  present, with no evidence of aortic valve stenosis.   8. The inferior vena cava is normal in size with <50% respiratory  variability, suggesting right atrial pressure of 8 mmHg.  Patient Profile     66 y.o. 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  Assessment & Plan    1. Chronic combined systolic and diastolic CHF secondary to NICM status post ICD with device firing: -Euvolemic on exam, further assess hemodynamics with RHC later today -Patient with [employer] single chamber ICD that fired 6 times on 02/07/2022 with written report showing it was for VT/VF.  He presented in A-fib with RVR with subsequent spontaneous conversion -Evaluated by EP this admission with initial shock felt to be secondary to A-fib with RVR resulting in initially irregular than regularizing tachycardia suggestive of A-fib or a flutter with one-to-one conduction -EP has resumed amiodarone with continuation of Toprol-XL -EP plans to reprogram his ICD prior to discharge -Potassium and magnesium at goal -Pursue R/LHC today -Continue current GDMT including Toprol-XL, Entresto, and spironolactone -Would look to add SGLT2 inhibitor prior to discharge   2.  Newly diagnosed A-fib: -Maintaining sinus rhythm status post spontaneous cardioversion -Heparin drip for now -CHA2DS2-VASc at least 4 (CHF, HTN, age x1, vascular disease) -Will need to pursue OAC at discharge -TSH normal   3.  Elevated troponin: -Remote cath showed nonobstructive  disease -N.p.o. -R/LHC this afternoon (delayed yesterday due to emergency requiring cath lab usage) -Heparin gtt -ASA   Shared Decision Making/Informed Consent{  The risks [stroke (1 in 1000), death (1 in 1000), kidney failure [usually temporary] (1 in 500), bleeding (1 in 200), allergic reaction [possibly serious] (1 in 200)], benefits (diagnostic support and management of coronary artery disease) and alternatives of a cardiac catheterization were discussed in detail with Mr. Dusza and he is willing to proceed.   For questions or updates, please contact [facility] Please consult [email or web address] for contact info under Cardiology/STEMI.    Signed, Eula Listen, PA-C [facility] Pager: [phone] 02/10/2022, 8:14 AM

## 2022-02-11 ENCOUNTER — Telehealth (HOSPITAL_COMMUNITY): Payer: Self-pay | Admitting: Pharmacy Technician

## 2022-02-11 ENCOUNTER — Encounter: Payer: Self-pay | Admitting: Internal Medicine

## 2022-02-11 ENCOUNTER — Other Ambulatory Visit (HOSPITAL_COMMUNITY): Payer: Self-pay

## 2022-02-11 DIAGNOSIS — R7989 Other specified abnormal findings of blood chemistry: Secondary | ICD-10-CM | POA: Diagnosis not present

## 2022-02-11 DIAGNOSIS — I5022 Chronic systolic (congestive) heart failure: Secondary | ICD-10-CM | POA: Diagnosis not present

## 2022-02-11 DIAGNOSIS — I5043 Acute on chronic combined systolic (congestive) and diastolic (congestive) heart failure: Secondary | ICD-10-CM

## 2022-02-11 DIAGNOSIS — Z4502 Encounter for adjustment and management of automatic implantable cardiac defibrillator: Secondary | ICD-10-CM

## 2022-02-11 DIAGNOSIS — I428 Other cardiomyopathies: Secondary | ICD-10-CM | POA: Diagnosis not present

## 2022-02-11 DIAGNOSIS — I10 Essential (primary) hypertension: Secondary | ICD-10-CM | POA: Diagnosis not present

## 2022-02-11 DIAGNOSIS — I4891 Unspecified atrial fibrillation: Secondary | ICD-10-CM

## 2022-02-11 LAB — BASIC METABOLIC PANEL
Anion gap: 8 (ref 5–15)
BUN: 20 mg/dL (ref 8–23)
CO2: 22 mmol/L (ref 22–32)
Calcium: 9.6 mg/dL (ref 8.9–10.3)
Chloride: 105 mmol/L (ref 98–111)
Creatinine, Ser: 1.64 mg/dL — ABNORMAL HIGH (ref 0.61–1.24)
GFR, Estimated: 46 mL/min — ABNORMAL LOW (ref >60)
Glucose, Bld: 102 mg/dL — ABNORMAL HIGH (ref 70–99)
Potassium: 4.2 mmol/L (ref 3.5–5.1)
Sodium: 135 mmol/L (ref 135–145)

## 2022-02-11 LAB — CBC
HCT: 45 % (ref 39.0–52.0)
Hemoglobin: 15 g/dL (ref 13.0–17.0)
MCH: 27.1 pg (ref 26.0–34.0)
MCHC: 33.3 g/dL (ref 30.0–36.0)
MCV: 81.4 fL (ref 80.0–100.0)
Platelets: 214 10*3/uL (ref 150–400)
RBC: 5.53 MIL/uL (ref 4.22–5.81)
RDW: 13.5 % (ref 11.5–15.5)
WBC: 9 10*3/uL (ref 4.0–10.5)
nRBC: 0 % (ref 0.0–0.2)

## 2022-02-11 LAB — HEPARIN LEVEL (UNFRACTIONATED)
Heparin Unfractionated: 0.36 IU/mL (ref 0.30–0.70)
Heparin Unfractionated: 0.49 IU/mL (ref 0.30–0.70)

## 2022-02-11 MED ORDER — ROSUVASTATIN CALCIUM 10 MG PO TABS
10.0000 mg | ORAL_TABLET | Freq: Every day | ORAL | Status: DC
  Administered 2022-02-11 – 2022-02-12 (×2): 10 mg via ORAL
  Filled 2022-02-11 (×2): qty 1

## 2022-02-11 MED ORDER — APIXABAN 5 MG PO TABS
5.0000 mg | ORAL_TABLET | Freq: Two times a day (BID) | ORAL | Status: DC
  Administered 2022-02-11 – 2022-02-13 (×5): 5 mg via ORAL
  Filled 2022-02-11 (×5): qty 1

## 2022-02-11 NOTE — Consult Note (Signed)
ANTICOAGULATION CONSULT NOTE  Pharmacy Consult for heparin infusion Indication: Afib  Allergies  Allergen Reactions   Shellfish Allergy Anaphylaxis    Patient Measurements: Height: 5\' 8"  (172.7 cm) Weight: 102.1 kg (225 lb 1.4 oz) IBW/kg (Calculated) : 68.4 Heparin Dosing Weight: 90.5kg  Vital Signs: Temp: 98 F (36.7 C) (10/25 2352) BP: 98/61 (10/25 2352) Pulse Rate: 55 (10/25 2352)  Labs: Recent Labs    02/08/22 0520 02/09/22 0433 02/10/22 0610 02/10/22 1233 02/10/22 1236 02/10/22 2343  HGB 13.8  --  14.5 15.3 15.3  --   HCT 41.4  --  43.6 45.0 45.0  --   PLT 175  --  185  --   --   --   HEPARINUNFRC 0.61 0.59 0.75*  --   --  0.36  CREATININE 1.44*  --   --   --   --   --      Estimated Creatinine Clearance: 59.2 mL/min (A) (by C-G formula based on SCr of 1.44 mg/dL (H)).   Medical History: Past Medical History:  Diagnosis Date   Anxiety    Arrhythmia    CHF (congestive heart failure) ([place])    Hypertension    Kidney stones    Shortness of breath dyspnea     Medications:  PTA: N/A Inpatient: Heparin infusion (10/22 > ) Allergies: No AC/APT related allergies  Assessment: 66 year old male with PMH CHF, HTN, and implantable cardiac defibrillator presents to ED following referral from [facility] after pacemaker debilitated 3 times in a row. Troponin I elevated at 147 followed by 702 ng/L. 12-lead EKG notes atrial fibrillation. Pharmacy consulted for management of heparin infusion in the setting of Afib.  Goal of Therapy:  Heparin level 0.3-0.7 units/ml Monitor platelets by anticoagulation protocol: Yes   Date Time aPTT/HL Rate/Comment 10/22 2311 HL 0.49 Therapeutic x 1 10/23 0520 HL 0.61 Therapeutic x 2 10/24 0433 HL 0.59 Therapeutic x 3 10/25 0610 HL 0.75 SUPRAtherapeutic   Plan:  1025 2343: HL 0.36, therapeutic x 1 Check confirmatory HL in 6 hours CBC daily while on heparin  Pearla Dubonnet, PharmD Clinical Pharmacist 02/11/2022 12:31  AM

## 2022-02-11 NOTE — Assessment & Plan Note (Signed)
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

## 2022-02-11 NOTE — Assessment & Plan Note (Signed)
Left and right heart cath on 10/25 showed severe pulmonary hypertension with mild nonobstructive CAD Continue Entresto, metoprolol, spironolactone. Cardio considering addition of Farxiga at DC 

## 2022-02-11 NOTE — Assessment & Plan Note (Signed)
Jerry Howe

## 2022-02-11 NOTE — Telephone Encounter (Signed)
Pharmacy Patient Advocate Encounter  Insurance verification completed.    The patient is insured through [employer] Part D   The patient is currently admitted and ran test claims for the following: Eliquis .  Copays and coinsurance results were relayed to Inpatient clinical team.  

## 2022-02-11 NOTE — Progress Notes (Signed)
Rounding Note    Patient Name: Jerry Howe Date of Encounter: 02/11/2022  [facility] Cardiologist: New  Subjective   Patient reports improving breathing. No chest pain. Cath sites stable.   Inpatient Medications    Scheduled Meds:  amiodarone  400 mg Oral BID   vitamin C  1,000 mg Oral Daily   cyanocobalamin  2,000 mcg Oral Daily   furosemide  40 mg Intravenous BID   metoprolol succinate  25 mg Oral Daily   pantoprazole  40 mg Oral Daily   pyridoxine  500 mg Oral Daily   sacubitril-valsartan  1 tablet Oral BID   sodium chloride flush  3 mL Intravenous Q12H   spironolactone  25 mg Oral Daily   Continuous Infusions:  sodium chloride     heparin 1,200 Units/hr (02/11/22 0551)   PRN Meds: sodium chloride, acetaminophen **OR** acetaminophen, albuterol, ondansetron **OR** ondansetron (ZOFRAN) IV, mouth rinse, pneumococcal 20-valent conjugate vaccine, senna-docusate, sodium chloride flush   Vital Signs    Vitals:   02/10/22 2141 02/10/22 2352 02/11/22 0337 02/11/22 0735  BP: 117/78 98/61 105/64 109/71  Pulse: 60 (!) 55 (!) 55 62  Resp: 16 17 18 18   Temp:  98 F (36.7 C) 97.7 F (36.5 C) 98.2 F (36.8 C)  TempSrc:      SpO2: 98% 100% 99% 100%  Weight:      Height:        Intake/Output Summary (Last 24 hours) at 02/11/2022 0841 Last data filed at 02/11/2022 0551 Gross per 24 hour  Intake 671 ml  Output 300 ml  Net 371 ml      02/10/2022   10:50 AM 02/07/2022   12:03 PM 01/27/2022    9:46 AM  Last 3 Weights  Weight (lbs) 225 lb 1.4 oz 225 lb 226 lb  Weight (kg) 102.1 kg 102.059 kg 102.513 kg      Telemetry    SB HR 50s, PVCs - Personally Reviewed  ECG    No new - Personally Reviewed  Physical Exam   GEN: No acute distress.   Neck: No JVD Cardiac: RRR, no murmurs, rubs, or gallops.  Respiratory: crackles at bases GI: Soft, nontender, non-distended  MS: No edema; No deformity. Neuro:  Nonfocal  Psych: Normal affect   Labs     High Sensitivity Troponin:   Recent Labs  Lab 02/07/22 1221 02/07/22 1447  TROPONINIHS 147* 702*     Chemistry Recent Labs  Lab 02/07/22 1221 02/07/22 1703 02/08/22 0520 02/10/22 1233 02/10/22 1236 02/11/22 0401  NA 138  --  137 135 134* 135  K 4.0  --  4.3 4.4 4.5 4.2  CL 104  --  106  --   --  105  CO2 23  --  23  --   --  22  GLUCOSE 118*  --  94  --   --  102*  BUN 17  --  18  --   --  20  CREATININE 1.52*  --  1.44*  --   --  1.64*  CALCIUM 9.1  --  8.9  --   --  9.6  MG  --  2.0 2.0  --   --   --   PROT 7.1  --   --   --   --   --   ALBUMIN 3.8  --   --   --   --   --   AST 24  --   --   --   --   --  ALT 15  --   --   --   --   --   ALKPHOS 62  --   --   --   --   --   BILITOT 0.8  --   --   --   --   --   GFRNONAA 51*  --  54*  --   --  46*  ANIONGAP 11  --  8  --   --  8    Lipids No results for input(s): "CHOL", "TRIG", "HDL", "LABVLDL", "LDLCALC", "CHOLHDL" in the last 168 hours.  Hematology Recent Labs  Lab 02/08/22 0520 02/10/22 0610 02/10/22 1233 02/10/22 1236 02/11/22 0401  WBC 7.6 8.3  --   --  9.0  RBC 5.02 5.33  --   --  5.53  HGB 13.8 14.5 15.3 15.3 15.0  HCT 41.4 43.6 45.0 45.0 45.0  MCV 82.5 81.8  --   --  81.4  MCH 27.5 27.2  --   --  27.1  MCHC 33.3 33.3  --   --  33.3  RDW 13.6 13.4  --   --  13.5  PLT 175 185  --   --  214   Thyroid  Recent Labs  Lab 02/07/22 1703  TSH 0.835    BNP Recent Labs  Lab 02/07/22 1221  BNP 300.5*    DDimer No results for input(s): "DDIMER" in the last 168 hours.   Radiology    CARDIAC CATHETERIZATION  Addendum Date: 02/10/2022   Conclusions: Mild, nonobstructive coronary artery disease with 10-20% stenosis involving the proximal LAD.  No significant disease involving ramus intermedius, LCx, or RCA. Moderately-severely elevated left heart filling pressures (PCWP 30 mmHg, LVEDP 40 mmHg).  Prominent V waves on PCWP tracing suggest moderate-severe mitral regurgitation. Severe pulmonary  hypertension (mean PAP 50 mmHg, PVR 4.3 WU). Moderately elevated right heart filling pressure (mean RA 14 mmHg, RV EDP 13 mmHg). Mildly reduced Fick cardiac output/index (CO 4.7 L/min, CI 2.2 L/min/m^2). 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 Escalate goal-directed medical therapy as blood pressure and renal function allow.  Consultation with advanced heart failure team recommended (can be done on an inpatient or outpatient basis based on clinical course). Medical therapy and risk factor modification to prevent progression of mild CAD. Restart IV heparin 2 hours after Zephyr band has been removed.  Start apixaban 5 mg twice daily tomorrow morning if no evidence of bleeding/vascular injury. Yvonne Kendall, MD [facility]   Result Date: 02/10/2022 Conclusions: Mild, nonobstructive coronary artery disease with 10-20% stenosis involving the proximal LAD.  No significant disease involving ramus intermedius, LCx, or RCA. Moderately-severely elevated left heart filling pressures (PCWP 30 mmHg, LVEDP 40 mmHg).  Prominent V waves on PCWP tracing suggest moderate-severe mitral regurgitation. Severe pulmonary hypertension (mean PAP 50 mmHg, PVR 4.3 WU). Moderately elevated right heart filling pressure (mean RA 14 mmHg, RV EDP 13 mmHg). 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 Escalate goal-directed medical therapy as blood pressure and renal function allow.  Consultation with advanced heart failure team recommended (can be done on an inpatient or outpatient basis based on clinical course). Medical therapy and risk factor modification to prevent progression of mild CAD. Yvonne Kendall, MD [facility]  ECHOCARDIOGRAM COMPLETE  Result Date: 02/09/2022    ECHOCARDIOGRAM REPORT   Patient Name:   Jerry Howe Date of Exam: 02/09/2022 Medical Rec #:  [id number]     Height:        68.0 in Accession #:    [id number]    Weight:       225.0 lb Date of Birth:  [birth year]    BSA:          2.149 m Patient Age:    66 years      BP:           93/67 mmHg Patient Gender: M             HR:           55 bpm. Exam Location:  [facility] Procedure: 2D Echo, Cardiac Doppler, Color Doppler and Intracardiac            Opacification Agent Indications:     Atrial Fibrillation I48.91  History:         Patient has prior history of Echocardiogram examinations, most                  recent 12/28/2016. CHF, Defibrillator, Signs/Symptoms:Shortness                  of Breath and Dyspnea; Risk Factors:Hypertension.  Sonographer:     Cristela Blue Referring Phys:  [id number] CHRISTOPHER END Diagnosing Phys: Yvonne Kendall MD  Sonographer Comments: Suboptimal apical window. IMPRESSIONS  1. Left ventricular ejection fraction, by estimation, is 20 to 25%. The left ventricle has severely decreased function. The left ventricle demonstrates global hypokinesis. The left ventricular internal cavity size was severely dilated. There is mild left ventricular hypertrophy. Left ventricular diastolic parameters are consistent with Grade III diastolic dysfunction (restrictive). Elevated left atrial pressure.  2. Right ventricular systolic function is normal. The right ventricular size is normal. There is normal pulmonary artery systolic pressure.  3. Left atrial size was moderately dilated.  4. Right atrial size was mildly dilated.  5. A small pericardial effusion is present. The pericardial effusion is circumferential.  6. The mitral valve is normal in structure. Mild to moderate mitral valve regurgitation.  7. The aortic valve is tricuspid. There is mild thickening of the aortic valve. Aortic valve regurgitation is mild. Aortic valve sclerosis is present, with no evidence of aortic valve stenosis.  8. The inferior vena cava is normal in size with <50% respiratory variability, suggesting right atrial pressure of 8 mmHg. FINDINGS  Left Ventricle:  Left ventricular ejection fraction, by estimation, is 20 to 25%. The left ventricle has severely decreased function. The left ventricle demonstrates global hypokinesis. Definity contrast agent was given IV to delineate the left ventricular endocardial borders. The left ventricular internal cavity size was severely dilated. There is mild left ventricular hypertrophy. Left ventricular diastolic parameters are consistent with Grade III diastolic dysfunction (restrictive). Elevated  left atrial pressure. Right Ventricle: The right ventricular size is normal. No increase in right ventricular wall thickness. Right ventricular systolic function is normal. There is normal pulmonary artery systolic pressure. The tricuspid regurgitant velocity is 2.17 m/s, and  with an assumed right atrial pressure of 3 mmHg, the estimated right ventricular systolic pressure is 21.8 mmHg. Left Atrium: Left atrial size was moderately dilated. Right Atrium: Right atrial size was mildly dilated. Pericardium: A small pericardial effusion is present. The pericardial effusion is circumferential. Mitral Valve: The mitral valve is normal in structure. There is mild thickening of the mitral valve leaflet(s). Mild mitral annular calcification. Mild to moderate mitral valve regurgitation. Tricuspid Valve: The tricuspid valve is not well visualized. Tricuspid valve regurgitation is  trivial. Aortic Valve: The aortic valve is tricuspid. There is mild thickening of the aortic valve. Aortic valve regurgitation is mild. Aortic valve sclerosis is present, with no evidence of aortic valve stenosis. Aortic valve mean gradient measures 2.0 mmHg. Aortic valve peak gradient measures 4.0 mmHg. Aortic valve area, by VTI measures 1.51 cm. Pulmonic Valve: The pulmonic valve was normal in structure. Pulmonic valve regurgitation is not visualized. No evidence of pulmonic stenosis. Aorta: The aortic root is normal in size and structure. Pulmonary Artery: The pulmonary  artery is not well seen. Venous: The inferior vena cava is normal in size with less than 50% respiratory variability, suggesting right atrial pressure of 8 mmHg. IAS/Shunts: The interatrial septum was not well visualized. Additional Comments: A device lead is visualized in the right ventricle.  LEFT VENTRICLE PLAX 2D LVIDd:         7.50 cm      Diastology LVIDs:         7.00 cm      LV e' medial:    3.92 cm/s LV PW:         1.10 cm      LV E/e' medial:  25.3 LV IVS:        1.20 cm      LV e' lateral:   6.74 cm/s LVOT diam:     2.30 cm      LV E/e' lateral: 14.7 LV SV:         22 LV SV Index:   10 LVOT Area:     4.15 cm  LV Volumes (MOD) LV vol d, MOD A2C: 307.0 ml LV vol d, MOD A4C: 361.0 ml LV vol s, MOD A2C: 222.0 ml LV vol s, MOD A4C: 289.0 ml LV SV MOD A2C:     85.0 ml LV SV MOD A4C:     361.0 ml LV SV MOD BP:      78.0 ml RIGHT VENTRICLE RV Basal diam:  3.40 cm RV Mid diam:    3.10 cm RV S prime:     11.30 cm/s TAPSE (M-mode): 3.4 cm LEFT ATRIUM              Index        RIGHT ATRIUM           Index LA diam:        4.80 cm  2.23 cm/m   RA Area:     21.60 cm LA Vol (A2C):   98.2 ml  45.70 ml/m  RA Volume:   57.20 ml  26.62 ml/m LA Vol (A4C):   99.0 ml  46.08 ml/m LA Biplane Vol: 105.0 ml 48.87 ml/m  AORTIC VALVE AV Area (Vmax):    1.49 cm AV Area (Vmean):   1.30 cm AV Area (VTI):     1.51 cm AV Vmax:           100.30 cm/s AV Vmean:          67.267 cm/s AV VTI:            0.144 m AV Peak Grad:      4.0 mmHg AV Mean Grad:      2.0 mmHg LVOT Vmax:         35.90 cm/s LVOT Vmean:        21.000 cm/s LVOT VTI:          0.052 m LVOT/AV VTI ratio: 0.36  AORTA Ao Root diam: 3.15 cm MITRAL VALVE  TRICUSPID VALVE MV Area (PHT): 4.96 cm    TR Peak grad:   18.8 mmHg MV Decel Time: 153 msec    TR Vmax:        217.00 cm/s MV E velocity: 99.00 cm/s                            SHUNTS                            Systemic VTI:  0.05 m                            Systemic Diam: 2.30 cm Yvonne Kendall MD  Electronically signed by Yvonne Kendall MD Signature Date/Time: 02/09/2022/1:07:26 PM    Final     Cardiac Studies   R/L heart cath 02/10/22 Conclusions: Mild, nonobstructive coronary artery disease with 10-20% stenosis involving the proximal LAD.  No significant disease involving ramus intermedius, LCx, or RCA. Moderately-severely elevated left heart filling pressures (PCWP 30 mmHg, LVEDP 40 mmHg).  Prominent V waves on PCWP tracing suggest moderate-severe mitral regurgitation. Severe pulmonary hypertension (mean PAP 50 mmHg, PVR 4.3 WU). Moderately elevated right heart filling pressure (mean RA 14 mmHg, RV EDP 13 mmHg). Mildly reduced Fick cardiac output/index (CO 4.7 L/min, CI 2.2 L/min/m^2).   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 Escalate goal-directed medical therapy as blood pressure and renal function allow.  Consultation with advanced heart failure team recommended (can be done on an inpatient or outpatient basis based on clinical course). Medical therapy and risk factor modification to prevent progression of mild CAD. Restart IV heparin 2 hours after Zephyr band has been removed.  Start apixaban 5 mg twice daily tomorrow morning if no evidence of bleeding/vascular injury.   Yvonne Kendall, MD [facility]   Recommendations  Antiplatelet/Anticoag Recommend to resume Apixaban, at currently prescribed dose and frequency on 02/11/2022. Concurrent antiplatelet therapy not recommended.  Discharge Date Anticipated discharge date to be determined.    2D echo 02/09/2022: 1. Left ventricular ejection fraction, by estimation, is 20 to 25%. The  left ventricle has severely decreased function. The left ventricle  demonstrates global hypokinesis. The left ventricular internal cavity size  was severely dilated. There is mild  left ventricular hypertrophy. Left ventricular diastolic parameters are  consistent with  Grade III diastolic dysfunction (restrictive). Elevated  left atrial pressure.   2. Right ventricular systolic function is normal. The right ventricular  size is normal. There is normal pulmonary artery systolic pressure.   3. Left atrial size was moderately dilated.   4. Right atrial size was mildly dilated.   5. A small pericardial effusion is present. The pericardial effusion is  circumferential.   6. The mitral valve is normal in structure. Mild to moderate mitral valve  regurgitation.   7. The aortic valve is tricuspid. There is mild thickening of the aortic  valve. Aortic valve regurgitation is mild. Aortic valve sclerosis is  present, with no evidence of aortic valve stenosis.   8. The inferior vena cava is normal in size with <50% respiratory  variability, suggesting right atrial pressure of 8 mmHg.   Patient Profile     66 y.o. 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   Assessment & Plan   Chronic combined systolic and diastolic CHF 2/2 to NICM s/p ICD with device firing - patient with Medtronic single chamber ICD that fired 6 times on 02/07/22 with report showing it was for VT/VF - presented with Afib RVR with subsequent spontaneous conversion - Evaluated by EP this admission who felt initial shock felt to be secondary to Afib RVR resulting in initially irregular than regularizing tachycardia suggesting afib or aflutter - EP resumed amiodarone 400mg  BID x 1 week, 200mg  BIX x 1 week, 200mg  daily thereafter - continue Toprol, Entresto, and spironolactone - Keep Mag>2 and K>4 - R/L heart cath showed mild nonobstructive CAD with moderately elevated left heart filling pressures, mod to severe MR, severe pulmonary HTN, moderately elevated right heart filling pressures - continue with diuresis IV lasix 40mg  BID - monitor kidney function with diuresis  given recent dye from heart cath - continue GDMT as able, will need ACHF visit eventually  Newly diagnosed Afib - in NSR s/p spontaneous cardioversion - IV heparin - CHADSVASC of at least 4 - patient will need OAC at discharge  Elevated troponin - heart cath showed nonobstructive disease - IV heparin - continue ASA  For questions or updates, please contact [facility] Please consult [email or web address] for contact info under        Signed, Zarya Lasseigne David Stall, PA-C  02/11/2022, 8:41 AM

## 2022-02-11 NOTE — Assessment & Plan Note (Signed)
-   due to supply demand ischemia in the setting of ICD shock x6

## 2022-02-11 NOTE — Assessment & Plan Note (Signed)
Per device interrogation he got shocked 6 times

## 2022-02-11 NOTE — Assessment & Plan Note (Addendum)
-   Now in sinus rhythm.  Continue amiodarone 400 mg p.o. twice daily for 1 week followed by 200 mg twice daily for 1 week and then 200 mg once daily.  Heparin stopped and Eliquis started today  - Per Medtronic interrogation - confirmed 6 shocks by ICD device [facility] cardiology seen and appreciate input -Continue amiodarone, Toprol XL, Entresto and Aldactone Cardiac cath on 10/25 showed mild nonobstructive CAD, severe pulmonary hypertension

## 2022-02-11 NOTE — Consult Note (Signed)
ANTICOAGULATION CONSULT NOTE  Pharmacy Consult for heparin infusion Indication: Afib  Allergies  Allergen Reactions   Shellfish Allergy Anaphylaxis    Patient Measurements: Height: 5\' 8"  (172.7 cm) Weight: 102.1 kg (225 lb 1.4 oz) IBW/kg (Calculated) : 68.4 Heparin Dosing Weight: 90.5kg  Vital Signs: Temp: 97.7 F (36.5 C) (10/26 0337) BP: 105/64 (10/26 0337) Pulse Rate: 55 (10/26 0337)  Labs: Recent Labs    02/10/22 0610 02/10/22 1233 02/10/22 1236 02/10/22 2343 02/11/22 0401  HGB 14.5 15.3 15.3  --  15.0  HCT 43.6 45.0 45.0  --  45.0  PLT 185  --   --   --  214  HEPARINUNFRC 0.75*  --   --  0.36 0.49  CREATININE  --   --   --   --  1.64*     Estimated Creatinine Clearance: 52 mL/min (A) (by C-G formula based on SCr of 1.64 mg/dL (H)).   Medical History: Past Medical History:  Diagnosis Date   Anxiety    Arrhythmia    CHF (congestive heart failure) ([place])    Hypertension    Kidney stones    Shortness of breath dyspnea     Medications:  PTA: N/A Inpatient: Heparin infusion (10/22 > ) Allergies: No AC/APT related allergies  Assessment: 66 year old male with PMH CHF, HTN, and implantable cardiac defibrillator presents to ED following referral from [facility] clinic after pacemaker debilitated 3 times in a row. Troponin I elevated at 147 followed by 702 ng/L. 12-lead EKG notes atrial fibrillation. Pharmacy consulted for management of heparin infusion in the setting of Afib.  Goal of Therapy:  Heparin level 0.3-0.7 units/ml Monitor platelets by anticoagulation protocol: Yes   Date Time aPTT/HL Rate/Comment 10/22 2311 HL 0.49 Therapeutic x 1 10/23 0520 HL 0.61 Therapeutic x 2 10/24 0433 HL 0.59 Therapeutic x 3 10/25 0610 HL 0.75 SUPRAtherapeutic  10/25 2343 HL 0.36 Therapeutic x 1 10/26 0401 HL 0.49 Therapeutic x 2  Plan:  Will continue heparin infusion at 1200 units/hr Recheck HL with AM labs CBC daily while on heparin  Pearla Dubonnet,  PharmD Clinical Pharmacist 02/11/2022 6:37 AM

## 2022-02-11 NOTE — Progress Notes (Signed)
  Progress Note   Patient: Jerry Howe ALP:[id number] DOB: Jun 08, 1955 DOA: 02/07/2022     4 DOS: the patient was seen and examined on 02/11/2022   Brief hospital course: [name] is a 66 year old male with history of chronic systolic heart failure, hypertension, nonischemic cardiomyopathy, ICD status, hyperlipidemia, who presents emergency department for chief concerns of defibrillator going off 3 times while at church.  Initial vitals in the ED showed temperature of 97.9, respiration rate of 14, heart rate of 96, blood pressure 113/72, SPO2 of 97% on room air.  Serum sodium is 138, potassium 4.0, chloride 104, bicarb 23, BUN of 17, serum creatinine of 1.52, GFR 51, nonfasting blood glucose 118, WBC 7.5, hemoglobin 13.4, platelets of 172, high sensitive troponin is 147.  EDP consulted cardiology, who are aware of the patient, Dr. Jonelle Sidle.  ED treatment: None  10/25: Cath planned for today 10/26: Cath showed mild nonobstructive CAD and severe pulmonary hypertension.  Ongoing diuresis.  Stopped heparin and started Eliquis   Assessment and Plan: * NSTEMI (non-ST elevated myocardial infarction) (HCC)-resolved as of 02/11/2022 Wit  ICD (implantable cardioverter-defibrillator) discharge Per device interrogation he got shocked 6 times   Paroxysmal A-fib ([place]) - Now in sinus rhythm.  Continue amiodarone 400 mg p.o. twice daily for 1 week followed by 200 mg twice daily for 1 week and then 200 mg once daily.  Heparin stopped and Eliquis started today  - Per Med[employer] interrogation - confirmed 6 shocks by ICD device [facility] cardiology seen and appreciate input -Continue amiodarone, Toprol XL, Entresto and Aldactone Cardiac cath on 10/25 showed mild nonobstructive CAD, severe pulmonary hypertension  Elevated troponin - due to supply demand ischemia in the setting of ICD shock x6  Nonischemic cardiomyopathy ([place]) Left and right heart cath on 10/25 showed severe pulmonary  hypertension with mild nonobstructive CAD Continue Entresto, metoprolol, spironolactone. Cardio considering addition of Farxiga at DC  HTN (hypertension) - Continue Toprol-XL, Entresto, Aldactone.  Chronic HFrEF (heart failure with reduced ejection fraction) (HCC) Echo showed EF of 20 to 25% Cath 10/25 showed mild nonobstructive CAD        Subjective: Sleeping comfortably.  No new symptoms.  Wife at bedside  Physical Exam: Vitals:   02/11/22 0337 02/11/22 0735 02/11/22 1033 02/11/22 1154  BP: 105/64 109/71 132/76 127/81  Pulse: (!) 55 62 64 66  Resp: 18 18  20   Temp: 97.7 F (36.5 C) 98.2 F (36.8 C)  98.4 F (36.9 C)  TempSrc:    Oral  SpO2: 99% 100%  100%  Weight:      Height:       66 year old male sitting in the bed comfortably without any acute distress Lungs clear to auscultation bilaterally Cardiovascular regular rate and rhythm Abdomen soft, benign Neuro alert and oriented, nonfocal Skin no rash or lesion Data Reviewed:  Showed mild nonobstructive CAD.  Severe pulmonary hypertension  Family Communication: Wife updated at bedside  Disposition: Status is: Inpatient  Remains inpatient appropriate because: Ongoing medication adjustment from cardiology before consideration for discharge   Planned Discharge Destination: Home    DVT prophylaxis-Eliquis Time spent: 35 minutes  Author: Max Sane, MD 02/11/2022 2:14 PM  For on call review [email or web address].

## 2022-02-11 NOTE — Assessment & Plan Note (Signed)
-   Continue Toprol-XL, Entresto, Aldactone.

## 2022-02-11 NOTE — Plan of Care (Signed)

## 2022-02-11 NOTE — TOC Benefit Eligibility Note (Signed)
Patient Teacher, English as a foreign language completed.    The patient is currently admitted and upon discharge could be taking Eliquis 5 mg.  The current 30 day co-pay is $45.00.   The patient is insured through [place], [place] Patient Advocate Specialist [place] Patient Advocate Team Direct Number: [phone]  Fax: [phone]

## 2022-02-11 NOTE — Consult Note (Addendum)
Heart Failure Nurse Navigator Note  HFrEF 20-25%.  Left ventricular internal cavity size is severely dilated.  Mild LVH.  Grade 3 diastolic dysfunction normal right ventricular systolic function.  Moderate left atrial enlargement.  Mild right atrial enlargement.  Mild to moderate mitral regurgitation mild aortic insufficiency.  Cardiac catheterization revealed nonobstructive coronary artery disease.  Severe pulmonary hypertension.  He presented to the emergency room for concerns that his ICD had fired 3 times.  Chest x-ray with no acute findings.  Cardiomegaly.  BNP 300.   Comorbidities:  Hypertension Hyperlipidemia Nonischemic cardiomyopathy New Afib RVR  Medications:  Amiodarone  400 mg 2 times a day  Apixaban 5 mg 2 times a day Furosemide 40 mg IV 2 times a day Metoprolol succinate 25 mg daily Entresto 24/26 mg 1 tablet 2 times a day Spironolactone 25 mg daily  Labs:  Sodium 135, potassium 4.2, chloride 105, CO2 22, BUN 20, creatinine 1.64, GFR 46 Weight is 102.1 kg Blood pressure 132/76 Intake 67 mL Output 300 mL  Initial meeting with patient who was sitting up in the chair at bedside and his wife who was present.  States that he has heard the term heart failure before and is familiar with it.  Knows that his" heart functions at half of normal."  Also discussed having ICD.  Discussed taking care of his heart failure with maintaining a 2000 mg sodium daily restriction.  Fluid restriction of no more than 64 ounces daily and what constitutes a liquid.  Also discussed abstaining from salt, sea salt, pink salt, Himalayan salt.  Discussed at length what can be done to season foods.  He states at home that they have something called No  salt.  Explained that I really did not recommend that as its made with potassium chloride, and can cause problems with abnormal kidney function.  They voiced understanding.  Recommended using natural spices,  lemon juice, vinegar, Mrs. Dash or  Lower[employer]  salt free.  Also discussed restaurant eating.  States that recently he went out to eat with his brother and had 2 hotdogs.  Explained that each hot dog has approximately 400 mg of sodium and how that plays into his 2000 mg odium restriction.  Wife also states that they like to eat at restaurants, Asian/Chinese.  Made aware that they seasoning their foods with MSG to try to get their entre without the MSG being added.  Stressed reading labels, he states lately here he has been hungry and been eating 4-5 luncheon meat sandwiches which equaled 8 to 10 pieces of bread.  Aware that he needs to be reading labels and looking for the sodium content such as what it was in the bread he was eating.  Recommend not eating processed foods, prepackaged, convenience foods etc..  Also discussed the importance of fresh or frozen vegetables, staying away from the vegetables in the can as  with even rinsing them multiple times still getting some sodium.  Wife questions if she could cook the vegetables and chicken broth, there again I told her that the vegetable would be absorbing the sodium and would be better to cook in plain water and went over a healthy way to season.  Discussed fluid restriction of 2000 cc over 64 ounces in a 24-hour period, what constitutes a liquid.  They voiced understanding.  Went over the importance of daily weights and what to report.  Also discussed changes in symptoms to report.  Patient states that he had noted at home  that his abdomen was getting larger, became so that he could not shower.  Went over symptoms such as increasing abdominal girth, lower extremity edema, PND orthopnea, fatigue etc. Were all things to report to stay of the hospital.  Made aware that he has an appointment in the outpatient heart failure clinic on November 1 at 4 PM.  Has a 19% no-show which is 3 out of 16 appointments.  He was last seen in the outpatient clinic in 2018.  Spent greater than 45 minutes with  discussing above.  He was given the living with heart failure teaching booklet, zone magnet, info on heart failure and low-sodium along with weight chart.  He was also giving the heart healthy reduced sodium nutrition therapy handout.  They had no further questions.  Plan for patient to be seen by Dr. Haroldine Laws with the [facility] tomorrow as an inpatient.  Will continue to follow.  Pricilla Riffle RN CHFN

## 2022-02-12 DIAGNOSIS — R7989 Other specified abnormal findings of blood chemistry: Secondary | ICD-10-CM | POA: Diagnosis not present

## 2022-02-12 DIAGNOSIS — I472 Ventricular tachycardia, unspecified: Secondary | ICD-10-CM

## 2022-02-12 DIAGNOSIS — I214 Non-ST elevation (NSTEMI) myocardial infarction: Secondary | ICD-10-CM | POA: Diagnosis not present

## 2022-02-12 DIAGNOSIS — I5022 Chronic systolic (congestive) heart failure: Secondary | ICD-10-CM | POA: Diagnosis not present

## 2022-02-12 DIAGNOSIS — I5043 Acute on chronic combined systolic (congestive) and diastolic (congestive) heart failure: Secondary | ICD-10-CM | POA: Diagnosis not present

## 2022-02-12 DIAGNOSIS — I4891 Unspecified atrial fibrillation: Secondary | ICD-10-CM | POA: Diagnosis not present

## 2022-02-12 LAB — BASIC METABOLIC PANEL
Anion gap: 10 (ref 5–15)
BUN: 29 mg/dL — ABNORMAL HIGH (ref 8–23)
CO2: 24 mmol/L (ref 22–32)
Calcium: 9.9 mg/dL (ref 8.9–10.3)
Chloride: 102 mmol/L (ref 98–111)
Creatinine, Ser: 1.85 mg/dL — ABNORMAL HIGH (ref 0.61–1.24)
GFR, Estimated: 40 mL/min — ABNORMAL LOW (ref >60)
Glucose, Bld: 117 mg/dL — ABNORMAL HIGH (ref 70–99)
Potassium: 4.1 mmol/L (ref 3.5–5.1)
Sodium: 136 mmol/L (ref 135–145)

## 2022-02-12 LAB — LIPOPROTEIN A (LPA): Lipoprotein (a): 207 nmol/L — ABNORMAL HIGH (ref <75.0)

## 2022-02-12 LAB — CBC
HCT: 46.8 % (ref 39.0–52.0)
Hemoglobin: 15.6 g/dL (ref 13.0–17.0)
MCH: 27.3 pg (ref 26.0–34.0)
MCHC: 33.3 g/dL (ref 30.0–36.0)
MCV: 82 fL (ref 80.0–100.0)
Platelets: 217 10*3/uL (ref 150–400)
RBC: 5.71 MIL/uL (ref 4.22–5.81)
RDW: 13.5 % (ref 11.5–15.5)
WBC: 8.9 10*3/uL (ref 4.0–10.5)
nRBC: 0 % (ref 0.0–0.2)

## 2022-02-12 LAB — HEPARIN LEVEL (UNFRACTIONATED): Heparin Unfractionated: >1.1 IU/mL — ABNORMAL HIGH (ref 0.30–0.70)

## 2022-02-12 MED ORDER — TORSEMIDE 20 MG PO TABS: 20.0000 mg | ORAL_TABLET | Freq: Every day | ORAL | 0 refills | Status: DC

## 2022-02-12 MED ORDER — POTASSIUM CHLORIDE CRYS ER 20 MEQ PO TBCR
20.0000 meq | EXTENDED_RELEASE_TABLET | Freq: Two times a day (BID) | ORAL | Status: DC
  Administered 2022-02-12 – 2022-02-13 (×3): 20 meq via ORAL
  Filled 2022-02-12 (×3): qty 1

## 2022-02-12 MED ORDER — FUROSEMIDE 10 MG/ML IJ SOLN
80.0000 mg | Freq: Once | INTRAMUSCULAR | Status: AC
  Administered 2022-02-12: 80 mg via INTRAVENOUS
  Filled 2022-02-12: qty 8

## 2022-02-12 MED ORDER — AMIODARONE HCL 200 MG PO TABS: 400.0000 mg | ORAL_TABLET | Freq: Two times a day (BID) | ORAL | 1 refills | Status: DC

## 2022-02-12 MED ORDER — APIXABAN 5 MG PO TABS: 5.0000 mg | ORAL_TABLET | Freq: Two times a day (BID) | ORAL | 0 refills | Status: DC

## 2022-02-12 MED ORDER — FUROSEMIDE 10 MG/ML IJ SOLN
80.0000 mg | Freq: Two times a day (BID) | INTRAMUSCULAR | Status: DC
  Administered 2022-02-12 – 2022-02-13 (×2): 80 mg via INTRAVENOUS
  Filled 2022-02-12 (×2): qty 8

## 2022-02-12 MED ORDER — ROSUVASTATIN CALCIUM 10 MG PO TABS: 10.0000 mg | ORAL_TABLET | Freq: Every day | ORAL | 0 refills | Status: DC

## 2022-02-12 NOTE — Progress Notes (Signed)
ReDS Vest / Clip - 02/12/22 1000       ReDS Vest / Clip   Station Marker D    Ruler Value 40    ReDS Actual Value 35

## 2022-02-12 NOTE — Assessment & Plan Note (Signed)
-   Now in sinus rhythm.  Continue amiodarone 400 mg p.o. twice daily for 1 week followed by 200 mg twice daily for 1 week and then 200 mg once daily.  Heparin stopped and Eliquis started today  - Per Med[employer] interrogation - confirmed 6 shocks by ICD device [facility] cardiology seen and appreciate input. Dr Haroldine Laws (Advanced CHF) eval today and recommends further Diuresis with Lasix 80 bid -Continue amiodarone, Toprol XL, Entresto and Aldactone Cardiac cath on 10/25 showed mild nonobstructive CAD, severe pulmonary hypertension

## 2022-02-12 NOTE — Care Management Important Message (Signed)
Important Message  Patient Details  Name: Jerry Howe MRN: [id number] Date of Birth: 04-16-1956   Medicare Important Message Given:  Yes     Dannette Barbara 02/12/2022, 1:35 PM

## 2022-02-12 NOTE — Assessment & Plan Note (Signed)
-   due to supply demand ischemia in the setting of ICD shock x6 

## 2022-02-12 NOTE — Assessment & Plan Note (Signed)
-   Continue Toprol-XL, Entresto, Aldactone. 

## 2022-02-12 NOTE — Consult Note (Signed)
Advanced Heart Failure Team Consult Note   Primary Physician: Gladstone Lighter, MD PCP-Cardiologist:  Ida Rogue, MD  Reason for Consultation: Systolic HF  HPI:    Jerry Howe is a 66 y/o male with systolic HF due to NICM, PAF, HTN he is being seen today for evaluation of heart failure at the request of Dr. Max Sane   Jerry Howe was diagnosed with heart failure in 2016.  LHC at that time revealed minimal CAD.  LVEF was 25%.  He was followed by [facility] clinic and recently transferred to [facility] heart care.  He established with Dr. Rockey Situ on 12/2021 and Dr. Quentin Ore on 01/2022.  He reported NYHA Class II symptoms and lisinopril switch to Entresto.  He remained on spironolactone and metoprolol.  He established with Dr. Quentin Ore on 01/2022 he was stable and doing well.  He has no history of atrial fibrillation.  Echo 3/20 EF 25-30% LVIDD 7.6 Echo 02/09/22  EF 20-25% RV ok  LVIDD 7.5  At baseline NYHA III. Says he stopped lasix earlier this year and got worse but restarted 2 months ago and was a bit better.   He presented to the ED on 10/22 after his defibrillator fired 6 times at church after he got into a heated discussion.  In the ED he was noted to be in atrial fibrillation.  He converted on his own. Seen by Dr. Caryl Comes and ICD interogation showed inappropriate ICD firing for AF. Started on amio. Also found to have frequent PVCs.   Cath 02/10/22  LAD 10-20%prox. 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 Lately they have been asking him to do more intense labor and he gets short of breath and has to stop.  He denies chest pain.  Denies snoring. Never test for OSA.   No ETOH, drugs or family h/o CHF.    Review of Systems: [y] = yes, [ ]  = no   General: Weight gain [ ] ; Weight loss [ ] ; Anorexia [ ] ; Fatigue [ ] ; Fever [  ]; Chills [ ] ; Weakness [ ]   Cardiac: Chest pain/pressure [ ] ; Resting SOB [ ] ; Exertional SOB Blue.Reese ]; Orthopnea [ ] ; Pedal Edema [ y]; Palpitations [ ] ; Syncope [ ] ; Presyncope [ ] ; Paroxysmal nocturnal dyspnea[ ]   Pulmonary: Cough [ ] ; Wheezing[ ] ; Hemoptysis[ ] ; Sputum [ ] ; Snoring [ ]   GI: Vomiting[ ] ; Dysphagia[ ] ; Melena[ ] ; Hematochezia [ ] ; Heartburn[ ] ; Abdominal pain [ ] ; Constipation [ ] ; Diarrhea [ ] ; BRBPR [ ]   GU: Hematuria[ ] ; Dysuria [ ] ; Nocturia[ ]   Vascular: Pain in legs with walking [ ] ; Pain in feet with lying flat [ ] ; Non-healing sores [ ] ; Stroke [ ] ; TIA [ ] ; Slurred speech [ ] ;  Neuro: Headaches[ ] ; Vertigo[ ] ; Seizures[ ] ; Paresthesias[ ] ;Blurred vision [ ] ; Diplopia [ ] ; Vision changes [ ]   Ortho/Skin: Arthritis [ y]; Joint pain [ y]; Muscle pain [ ] ; Joint swelling [ ] ; Back Pain [ ] ; Rash [ ]   Psych: Depression[ ] ; Anxiety[ ]   Heme: Bleeding problems [ ] ; Clotting disorders [ ] ; Anemia [ ]   Endocrine: Diabetes [ ] ; Thyroid dysfunction[ ]   Home Medications Prior to Admission medications   Medication Sig Start Date End Date Taking? Authorizing  Provider  Ascorbic Acid (VITAMIN C) 1000 MG tablet Take 1,000 mg by mouth daily.   Yes [provider]  cyanocobalamin 2000 MCG tablet Take 2,000 mcg by mouth daily.   Yes [provider]  Magnesium 200 MG TABS Take 200 mg by mouth daily.   Yes [provider]  metoprolol succinate (TOPROL-XL) 25 MG 24 hr tablet Take 25 mg by mouth daily. 11/25/21  Yes [provider]  Multiple Vitamins-Minerals (SUPER THERA VITE M PO) Take 1 tablet by mouth daily.   Yes [provider]  omeprazole (PRILOSEC) 20 MG capsule Take by mouth. 11/30/21 11/30/22 Yes [provider]  pyridoxine (B-6) 500 MG tablet Take 500 mg by mouth daily.   Yes [provider]  sacubitril-valsartan (ENTRESTO) 24-26 MG Take 1 tablet (24-26 mg) by mouth twice daily 01/05/22  Yes Gollan, Kathlene November, MD   spironolactone (ALDACTONE) 25 MG tablet Take 1 tablet by mouth daily. 07/30/21  Yes [provider]  albuterol (PROVENTIL HFA;VENTOLIN HFA) 108 (90 Base) MCG/ACT inhaler Inhale 1 puff into the lungs every 6 (six) hours as needed for wheezing or shortness of breath.    [provider]  Nutritional Supplements (NOURISH) LIQD Take by mouth.    [provider]  rosuvastatin (CRESTOR) 10 MG tablet Take 10 mg by mouth at bedtime. Patient not taking: Reported on 02/07/2022 01/23/22   [provider]    Past Medical History: Past Medical History:  Diagnosis Date   Anxiety    Arrhythmia    CHF (congestive heart failure) ([place])    Hypertension    Kidney stones    Shortness of breath dyspnea     Past Surgical History: Past Surgical History:  Procedure Laterality Date   CARDIAC CATHETERIZATION Right 11/20/2014   Procedure: Left Heart Cath and Coronary Angiography;  Surgeon: Isaias Cowman, MD;  Location: [facility];  Service: Cardiovascular;  Laterality: Right;   IMPLANTABLE CARDIOVERTER DEFIBRILLATOR (ICD) GENERATOR CHANGE Left 08/15/2015   Procedure: ICD IMPLANT, single chamber;  Surgeon: Marzetta Board, MD;  Location: [facility];  Service: Cardiovascular;  Laterality: Left;   KNEE SURGERY     RIGHT/LEFT HEART CATH AND CORONARY ANGIOGRAPHY N/A 02/10/2022   Procedure: RIGHT/LEFT HEART CATH AND CORONARY ANGIOGRAPHY;  Surgeon: Nelva Bush, MD;  Location: [facility];  Service: Cardiovascular;  Laterality: N/A;    Family History: Family History  Problem Relation Age of Onset   Hyperlipidemia Mother    Hypertension Father    Heart disease Father    Hyperlipidemia Brother    Diabetes Brother    Heart failure Neg Hx     Social History: Social History   Socioeconomic History   Marital status: Married    Spouse name: Jerry Howe   Number of children: 5   Years of education: Not on file   Highest education level: Not on file   Occupational History   Not on file  Tobacco Use   Smoking status: Never   Smokeless tobacco: Never  Vaping Use   Vaping Use: Never used  Substance and Sexual Activity   Alcohol use: No   Drug use: No   Sexual activity: Yes    Partners: Female  Other Topics Concern   Not on file  Social History Narrative   Lives at home with spouse; 5 kids, 40 grandkids & 2 great grandkids   Social Determinants of Radio broadcast assistant Strain: Not on file  Food Insecurity: No Food Insecurity (  02/08/2022)   Hunger Vital Sign    Worried About Running Out of Food in the Last Year: Never true    [place] in the Last Year: Never true  Transportation Needs: No Transportation Needs (02/08/2022)   PRAPARE - Hydrologist (Medical): No    Lack of Transportation (Non-Medical): No  Physical Activity: Not on file  Stress: Not on file  Social Connections: Not on file    Allergies:  Allergies  Allergen Reactions   Shellfish Allergy Anaphylaxis    Objective:    Vital Signs:   Temp:  [97.4 F (36.3 C)-98.2 F (36.8 C)] 98.2 F (36.8 C) (10/27 1115) Pulse Rate:  [51-60] 60 (10/27 1115) Resp:  [14-20] 18 (10/27 1115) BP: (96-128)/(60-78) 115/66 (10/27 1115) SpO2:  [97 %-100 %] 98 % (10/27 1115) Weight:  [94.3 kg] 94.3 kg (10/27 0945)    Weight change: Filed Weights   02/07/22 1203 02/10/22 1050 02/12/22 0945  Weight: 102.1 kg 102.1 kg 94.3 kg    Intake/Output:   Intake/Output Summary (Last 24 hours) at 02/12/2022 1203 Last data filed at 02/12/2022 1115 Gross per 24 hour  Intake 543 ml  Output 2100 ml  Net -1557 ml      Physical Exam    General:  Well appearing. No resp difficulty HEENT: normal Neck: supple. JVP hard to see. 8-9(?) . Carotids 2+ bilat; no bruits. No lymphadenopathy or thyromegaly appreciated. Cor: PMI nondisplaced. Regular rate & rhythm. No rubs, gallops or murmurs. Lungs: clear Abdomen: obese soft, nontender,  nondistended. No hepatosplenomegaly. No bruits or masses. Good bowel sounds. Extremities: no cyanosis, clubbing, rash, edema Neuro: alert & orientedx3, cranial nerves grossly intact. moves all 4 extremities w/o difficulty. Affect pleasant   Telemetry   Sinus 50-60s Personally reviewed   EKG    SB 57 IVCD/iLBBB (155ms) Personally reviewed  Labs   Basic Metabolic Panel: Recent Labs  Lab 02/07/22 1221 02/07/22 1703 02/08/22 0520 02/10/22 1233 02/10/22 1236 02/11/22 0401 02/12/22 0753  NA 138  --  137 135 134* 135 136  K 4.0  --  4.3 4.4 4.5 4.2 4.1  CL 104  --  106  --   --  105 102  CO2 23  --  23  --   --  22 24  GLUCOSE 118*  --  94  --   --  102* 117*  BUN 17  --  18  --   --  20 29*  CREATININE 1.52*  --  1.44*  --   --  1.64* 1.85*  CALCIUM 9.1  --  8.9  --   --  9.6 9.9  MG  --  2.0 2.0  --   --   --   --   PHOS  --  3.4  --   --   --   --   --     Liver Function Tests: Recent Labs  Lab 02/07/22 1221  AST 24  ALT 15  ALKPHOS 62  BILITOT 0.8  PROT 7.1  ALBUMIN 3.8   No results for input(s): "LIPASE", "AMYLASE" in the last 168 hours. No results for input(s): "AMMONIA" in the last 168 hours.  CBC: Recent Labs  Lab 02/07/22 1221 02/08/22 0520 02/10/22 0610 02/10/22 1233 02/10/22 1236 02/11/22 0401 02/12/22 0606  WBC 7.5 7.6 8.3  --   --  9.0 8.9  NEUTROABS 5.7  --   --   --   --   --   --  HGB 13.4 13.8 14.5 15.3 15.3 15.0 15.6  HCT 41.9 41.4 43.6 45.0 45.0 45.0 46.8  MCV 83.6 82.5 81.8  --   --  81.4 82.0  PLT 172 175 185  --   --  214 217    Cardiac Enzymes: No results for input(s): "CKTOTAL", "CKMB", "CKMBINDEX", "TROPONINI" in the last 168 hours.  BNP: BNP (last 3 results) Recent Labs    02/07/22 1221  BNP 300.5*    ProBNP (last 3 results) No results for input(s): "PROBNP" in the last 8760 hours.   CBG: No results for input(s): "GLUCAP" in the last 168 hours.  Coagulation Studies: No results for input(s): "LABPROT", "INR"  in the last 72 hours.   Imaging   No results found.   Medications:     Current Medications:  amiodarone  400 mg Oral BID   apixaban  5 mg Oral BID   vitamin C  1,000 mg Oral Daily   cyanocobalamin  2,000 mcg Oral Daily   metoprolol succinate  25 mg Oral Daily   pantoprazole  40 mg Oral Daily   pyridoxine  500 mg Oral Daily   rosuvastatin  10 mg Oral QHS   sacubitril-valsartan  1 tablet Oral BID   sodium chloride flush  3 mL Intravenous Q12H   spironolactone  25 mg Oral Daily    Infusions:  sodium chloride       Assessment/Plan   1. Acute on chronic systolic HF - due to NICM. Unclear etiology. Has not had cMRI - Echo 2016 EF 25% - Echo 3/20 EF 25-30% LVIDD 7.6 - Echo 02/09/22  EF 20-25% RV ok  LVIDD 7.5 G3DD - s/p MDT ICD - Cath 02/10/22 with minimal CAD (LAD 10-20%) markedly elevated filling pressures, severe pulmonary venous HTN and moderately reduced CO in setting of restrictive physiology - NYHA III at baseline  - Volume status remains markedly elevated on cath but looks ok on exam and ReDS lung water in normal range at 35% - With LVEDP > 40 I think he needs an attempt at aggressive diuresis prior to d/c to see if we can get his pressures down a bit. Start lasix 80 IV bid - Given cath and echo findings, I am concerned he may need advanced therapies in the near future but severe pulmonary venous HTN is a hurdle (will need repeat RHC in next few months and possible milrinone challenge to see if we can unload and bring down PA pressures) - Continue Entresto 24/26 bid - Continue Spiro 25 - Continue Toprol 25 - Add SGLT2i prior to d/c - Will need CPX testing in near future to risk stratify for advanced therapies   2. PAF - now s/p ICD inappropriate shock x 6 - loading amio  - in NSR today - continue Eliquis. - EP following  3. Frequent PVCs  - loading amio   4. CKD 3a  - baseline SCr 1.3-1.5 - watch with diuresis - start Jardiance 10 prior to d/c  Length  of Stay: 5  Glori Bickers, MD  02/12/2022, 12:03 PM  Advanced Heart Failure Team Pager [phone] (M-F; 7a - 5p)  Please contact [facility] for night-coverage after hours (4p -7a ) and weekends on [email or web address]

## 2022-02-12 NOTE — Progress Notes (Signed)
  Progress Note   Patient: Jerry Howe JKK:[id number] DOB: 10/04/1955 DOA: 02/07/2022     5 DOS: the patient was seen and examined on 02/12/2022   Brief hospital course: Mr. Lebaron Bautch is a 66 year old male with history of chronic systolic heart failure, hypertension, nonischemic cardiomyopathy, ICD status, hyperlipidemia, who presents emergency department for chief concerns of defibrillator going off 3 times while at church.  Initial vitals in the ED showed temperature of 97.9, respiration rate of 14, heart rate of 96, blood pressure 113/72, SPO2 of 97% on room air.  Serum sodium is 138, potassium 4.0, chloride 104, bicarb 23, BUN of 17, serum creatinine of 1.52, GFR 51, nonfasting blood glucose 118, WBC 7.5, hemoglobin 13.4, platelets of 172, high sensitive troponin is 147.  EDP consulted cardiology, who are aware of the patient, Dr. Jonelle Sidle.  ED treatment: None  10/25: Cath planned for today 10/26: Cath showed mild nonobstructive CAD and severe pulmonary hypertension.  Ongoing diuresis.  Stopped heparin and started Eliquis 10/27: Advanced CHF c/s with Dr Haroldine Laws - recommends further Diuresis while here   Assessment and Plan: * NSTEMI (non-ST elevated myocardial infarction) (HCC)-resolved as of 02/11/2022 Wit  ICD (implantable cardioverter-defibrillator) discharge Per device interrogation he got shocked 6 times .  Paroxysmal atrial fibrillation (HCC) - Now in sinus rhythm.  Continue amiodarone 400 mg p.o. twice daily for 1 week followed by 200 mg twice daily for 1 week and then 200 mg once daily.  Heparin stopped and Eliquis started today  - Per [employer] interrogation - confirmed 6 shocks by ICD device [facility] cardiology seen and appreciate input. Dr Haroldine Laws (Advanced CHF) eval today and recommends further Diuresis with Lasix 80 bid -Continue amiodarone, Toprol XL, Entresto and Aldactone Cardiac cath on 10/25 showed mild nonobstructive CAD, severe pulmonary  hypertension  Elevated troponin - due to supply demand ischemia in the setting of ICD shock x6.  NICM (nonischemic cardiomyopathy) ([place]) Left and right heart cath on 10/25 showed severe pulmonary hypertension with mild nonobstructive CAD Continue Entresto, metoprolol, spironolactone. Cardio considering addition of Farxiga at DC  HTN (hypertension) - Continue Toprol-XL, Entresto, Aldactone  Chronic HFrEF (heart failure with reduced ejection fraction) (HCC) Echo showed EF of 20 to 25% Cath 10/25 showed mild nonobstructive CAD Dr Haroldine Laws (Advanced CHF) eval today and recommends further Diuresis with Lasix 80 bid.  Net IO Since Admission: -408.85 mL [02/12/22 1437]        Subjective: Sitting in the chair comfortably without any new symptoms  Physical Exam: Vitals:   02/12/22 0644 02/12/22 0740 02/12/22 0945 02/12/22 1115  BP: 111/69 105/69  115/66  Pulse: (!) 53 (!) 52  60  Resp: 18 14  18   Temp: 97.7 F (36.5 C) 97.6 F (36.4 C)  98.2 F (36.8 C)  TempSrc:    Oral  SpO2: 98% 100%  98%  Weight:   94.3 kg   Height:       66 year old male sitting in the bed comfortably without any acute distress Lungs clear to auscultation bilaterally Cardiovascular regular rate and rhythm Abdomen soft, benign Neuro alert and oriented, nonfocal Skin no rash or lesion Data Reviewed:  Creatinine 1.85  Family Communication: None  Disposition: Status is: Inpatient Remains inpatient appropriate because: Ongoing diuresis per cardiology/Dr. Bensimhon   Planned Discharge Destination: Home    DVT prophylaxis-Eliquis Time spent: 35 minutes  Author: Max Sane, MD 02/12/2022 3:21 PM  For on call review [email or web address].

## 2022-02-12 NOTE — Progress Notes (Addendum)
   Heart Failure Nurse Navigator Note  Met with patient today, he was sitting up in the chair at bedside.  Wife not present at this time.   States that he has been out ambulating in the hall without any shortness of breath, chest pain, dizziness or lightheadedness.  He feels that his stomach has returned to normal.  By teach back method went over fluid restriction, signs and symptoms to report along with low-sodium diet.  Needs very little reinforcement. (Made aware to watch for increasing abdomin size--like what he was admitted with, along with early satiety that he had noted at home.)  Discussed using Tylenol for pain at home, he states that his wife removed all the ibuprofen/Motrin/Advil from the home and he knows not to take that medication.  Reds clip vest  reading 35.  Explained what we were looking for in doing this procedure.  Also introduced the patient to the ventricle health program.  He was given written handout.  Asked that he discuss this with his wife and that I would come back and explain to her.  He says understanding.  Met with patient and wife-they are in agreement.  Application sent.  He was given a weekly medication box and bag for bring his meds to visits at the clinic.  Pricilla Riffle RN CHFN

## 2022-02-12 NOTE — Assessment & Plan Note (Signed)
Echo showed EF of 20 to 25% Cath 10/25 showed mild nonobstructive CAD Dr Haroldine Laws (Advanced CHF) eval today and recommends further Diuresis with Lasix 80 bid.  Net IO Since Admission: -408.85 mL [02/12/22 1437]

## 2022-02-12 NOTE — Assessment & Plan Note (Signed)
Left and right heart cath on 10/25 showed severe pulmonary hypertension with mild nonobstructive CAD Continue Entresto, metoprolol, spironolactone. Cardio considering addition of Farxiga at [place]

## 2022-02-12 NOTE — Assessment & Plan Note (Signed)
Per device interrogation he got shocked 6 times  

## 2022-02-12 NOTE — Progress Notes (Signed)
Date and time results received: 02/12/22   Critical Value: 6 beat run of v-tach.  Name of Provider Notified: Sharion Settler NP  Orders Received? Or Actions Taken?: BP 111/69, map 82, HR 53, RR 18, temp. 97.7, 98% on RA. Patient Asymptomatic at this time. No orders placed at this time.

## 2022-02-12 NOTE — Progress Notes (Signed)
Rounding Note    Patient Name: Jerry Howe Date of Encounter: 02/12/2022  [facility] Cardiologist: Julien Nordmann, MD   Subjective   UOP -2L. AM labs pending. Patient denies chest pain or SOB.  Inpatient Medications    Scheduled Meds:  amiodarone  400 mg Oral BID   apixaban  5 mg Oral BID   vitamin C  1,000 mg Oral Daily   cyanocobalamin  2,000 mcg Oral Daily   furosemide  40 mg Intravenous BID   metoprolol succinate  25 mg Oral Daily   pantoprazole  40 mg Oral Daily   pyridoxine  500 mg Oral Daily   rosuvastatin  10 mg Oral QHS   sacubitril-valsartan  1 tablet Oral BID   sodium chloride flush  3 mL Intravenous Q12H   spironolactone  25 mg Oral Daily   Continuous Infusions:  sodium chloride     PRN Meds: sodium chloride, acetaminophen **OR** acetaminophen, albuterol, ondansetron **OR** ondansetron (ZOFRAN) IV, mouth rinse, pneumococcal 20-valent conjugate vaccine, senna-docusate, sodium chloride flush   Vital Signs    Vitals:   02/12/22 0036 02/12/22 0443 02/12/22 0644 02/12/22 0740  BP: 105/69 96/60 111/69 105/69  Pulse: (!) 51 (!) 53 (!) 53 (!) 52  Resp: 20 16 18 14   Temp: (!) 97.4 F (36.3 C) 97.6 F (36.4 C) 97.7 F (36.5 C) 97.6 F (36.4 C)  TempSrc:  Oral    SpO2: 100% 97% 98% 100%  Weight:      Height:        Intake/Output Summary (Last 24 hours) at 02/12/2022 0741 Last data filed at 02/12/2022 0500 Gross per 24 hour  Intake 723 ml  Output 2800 ml  Net -2077 ml      02/10/2022   10:50 AM 02/07/2022   12:03 PM 01/27/2022    9:46 AM  Last 3 Weights  Weight (lbs) 225 lb 1.4 oz 225 lb 226 lb  Weight (kg) 102.1 kg 102.059 kg 102.513 kg      Telemetry    SB, HR 50s, 6 beats NSVT - Personally Reviewed  ECG    No new - Personally Reviewed  Physical Exam   GEN: No acute distress.   Neck: No JVD Cardiac: RR, bradcyardia, no murmurs, rubs, or gallops.  Respiratory: Clear to auscultation bilaterally. GI: Soft, nontender,  non-distended  MS: No edema; No deformity. Neuro:  Nonfocal  Psych: Normal affect   Labs    High Sensitivity Troponin:   Recent Labs  Lab 02/07/22 1221 02/07/22 1447  TROPONINIHS 147* 702*     Chemistry Recent Labs  Lab 02/07/22 1221 02/07/22 1703 02/08/22 0520 02/10/22 1233 02/10/22 1236 02/11/22 0401  NA 138  --  137 135 134* 135  K 4.0  --  4.3 4.4 4.5 4.2  CL 104  --  106  --   --  105  CO2 23  --  23  --   --  22  GLUCOSE 118*  --  94  --   --  102*  BUN 17  --  18  --   --  20  CREATININE 1.52*  --  1.44*  --   --  1.64*  CALCIUM 9.1  --  8.9  --   --  9.6  MG  --  2.0 2.0  --   --   --   PROT 7.1  --   --   --   --   --   ALBUMIN 3.8  --   --   --   --   --  AST 24  --   --   --   --   --   ALT 15  --   --   --   --   --   ALKPHOS 62  --   --   --   --   --   BILITOT 0.8  --   --   --   --   --   GFRNONAA 51*  --  54*  --   --  46*  ANIONGAP 11  --  8  --   --  8    Lipids No results for input(s): "CHOL", "TRIG", "HDL", "LABVLDL", "LDLCALC", "CHOLHDL" in the last 168 hours.  Hematology Recent Labs  Lab 02/10/22 0610 02/10/22 1233 02/10/22 1236 02/11/22 0401 02/12/22 0606  WBC 8.3  --   --  9.0 8.9  RBC 5.33  --   --  5.53 5.71  HGB 14.5   < > 15.3 15.0 15.6  HCT 43.6   < > 45.0 45.0 46.8  MCV 81.8  --   --  81.4 82.0  MCH 27.2  --   --  27.1 27.3  MCHC 33.3  --   --  33.3 33.3  RDW 13.4  --   --  13.5 13.5  PLT 185  --   --  214 217   < > = values in this interval not displayed.   Thyroid  Recent Labs  Lab 02/07/22 1703  TSH 0.835    BNP Recent Labs  Lab 02/07/22 1221  BNP 300.5*    DDimer No results for input(s): "DDIMER" in the last 168 hours.   Radiology    CARDIAC CATHETERIZATION  Addendum Date: 02/10/2022   Conclusions: Mild, nonobstructive coronary artery disease with 10-20% stenosis involving the proximal LAD.  No significant disease involving ramus intermedius, LCx, or RCA. Moderately-severely elevated left heart filling  pressures (PCWP 30 mmHg, LVEDP 40 mmHg).  Prominent V waves on PCWP tracing suggest moderate-severe mitral regurgitation. Severe pulmonary hypertension (mean PAP 50 mmHg, PVR 4.3 WU). Moderately elevated right heart filling pressure (mean RA 14 mmHg, RV EDP 13 mmHg). Mildly reduced Fick cardiac output/index (CO 4.7 L/min, CI 2.2 L/min/m^2). 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 Escalate goal-directed medical therapy as blood pressure and renal function allow.  Consultation with advanced heart failure team recommended (can be done on an inpatient or outpatient basis based on clinical course). Medical therapy and risk factor modification to prevent progression of mild CAD. Restart IV heparin 2 hours after Zephyr band has been removed.  Start apixaban 5 mg twice daily tomorrow morning if no evidence of bleeding/vascular injury. Yvonne Kendall, MD [facility]   Result Date: 02/10/2022 Conclusions: Mild, nonobstructive coronary artery disease with 10-20% stenosis involving the proximal LAD.  No significant disease involving ramus intermedius, LCx, or RCA. Moderately-severely elevated left heart filling pressures (PCWP 30 mmHg, LVEDP 40 mmHg).  Prominent V waves on PCWP tracing suggest moderate-severe mitral regurgitation. Severe pulmonary hypertension (mean PAP 50 mmHg, PVR 4.3 WU). Moderately elevated right heart filling pressure (mean RA 14 mmHg, RV EDP 13 mmHg). 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 Escalate goal-directed medical therapy as blood pressure and renal function allow.  Consultation with advanced heart failure team recommended (can be done on an inpatient or outpatient basis based on clinical course). Medical therapy and risk factor modification to prevent progression of  mild CAD. Yvonne Kendall, MD [facility]   Cardiac Studies   R/L heart cath  02/10/22 Conclusions: Mild, nonobstructive coronary artery disease with 10-20% stenosis involving the proximal LAD.  No significant disease involving ramus intermedius, LCx, or RCA. Moderately-severely elevated left heart filling pressures (PCWP 30 mmHg, LVEDP 40 mmHg).  Prominent V waves on PCWP tracing suggest moderate-severe mitral regurgitation. Severe pulmonary hypertension (mean PAP 50 mmHg, PVR 4.3 WU). Moderately elevated right heart filling pressure (mean RA 14 mmHg, RV EDP 13 mmHg). Mildly reduced Fick cardiac output/index (CO 4.7 L/min, CI 2.2 L/min/m^2).   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 Escalate goal-directed medical therapy as blood pressure and renal function allow.  Consultation with advanced heart failure team recommended (can be done on an inpatient or outpatient basis based on clinical course). Medical therapy and risk factor modification to prevent progression of mild CAD. Restart IV heparin 2 hours after Zephyr band has been removed.  Start apixaban 5 mg twice daily tomorrow morning if no evidence of bleeding/vascular injury.   Yvonne Kendall, MD [facility]   Recommendations   Antiplatelet/Anticoag Recommend to resume Apixaban, at currently prescribed dose and frequency on 02/11/2022. Concurrent antiplatelet therapy not recommended.  Discharge Date Anticipated discharge date to be determined.      2D echo 02/09/2022: 1. Left ventricular ejection fraction, by estimation, is 20 to 25%. The  left ventricle has severely decreased function. The left ventricle  demonstrates global hypokinesis. The left ventricular internal cavity size  was severely dilated. There is mild  left ventricular hypertrophy. Left ventricular diastolic parameters are  consistent with Grade III diastolic dysfunction (restrictive). Elevated  left atrial pressure.   2. Right ventricular systolic function is normal.  The right ventricular  size is normal. There is normal pulmonary artery systolic pressure.   3. Left atrial size was moderately dilated.   4. Right atrial size was mildly dilated.   5. A small pericardial effusion is present. The pericardial effusion is  circumferential.   6. The mitral valve is normal in structure. Mild to moderate mitral valve  regurgitation.   7. The aortic valve is tricuspid. There is mild thickening of the aortic  valve. Aortic valve regurgitation is mild. Aortic valve sclerosis is  present, with no evidence of aortic valve stenosis.   8. The inferior vena cava is normal in size with <50% respiratory  variability, suggesting right atrial pressure of 8 mmHg.   Patient Profile     66 y.o. 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   Assessment & Plan    Chronic combined systolic and diastolic CHF 2/2 to NICM s/p ICD with device firing - patient with [employer] single chamber ICD that fired 6 times on 02/07/22 with report showing it was for VT/VF - presented with Afib RVR with subsequent spontaneous conversion - Evaluated by EP this admission who felt initial shock felt to be secondary to Afib RVR resulting in initially irregular than regularizing tachycardia suggesting afib or aflutter - EP resumed amiodarone 400mg  BID x 1 week, 200mg  BIX x 1 week, 200mg  daily thereafter - continue Toprol, Entresto, and spironolactone - Keep Mag>2 and K>4 - R/L heart cath showed mild nonobstructive CAD with moderately elevated left heart filling pressures, mod to severe MR, severe pulmonary HTN, moderately elevated right heart filling  pressures - IV lasix 40mg  BID - monitor kidney function with diuresis  - AM labs pending - continue GDMT as able, will need ACHF visit eventually   Newly diagnosed Afib - in NSR s/p spontaneous cardioversion - CHADSVASC  of at least 4 - continue Eliquis 5mg  BID   Elevated troponin - heart cath showed nonobstructive disease - ASA held for Eliquis - continue Crestor and Toprol  For questions or updates, please contact [place] Please consult [email or web address] for contact info under        Signed, Sulamita Lafountain Ninfa Meeker, PA-C  02/12/2022, 7:41 AM

## 2022-02-13 DIAGNOSIS — I4891 Unspecified atrial fibrillation: Secondary | ICD-10-CM | POA: Diagnosis not present

## 2022-02-13 DIAGNOSIS — I214 Non-ST elevation (NSTEMI) myocardial infarction: Secondary | ICD-10-CM | POA: Diagnosis not present

## 2022-02-13 DIAGNOSIS — I5043 Acute on chronic combined systolic (congestive) and diastolic (congestive) heart failure: Secondary | ICD-10-CM | POA: Diagnosis not present

## 2022-02-13 DIAGNOSIS — I5022 Chronic systolic (congestive) heart failure: Secondary | ICD-10-CM | POA: Diagnosis not present

## 2022-02-13 DIAGNOSIS — R7989 Other specified abnormal findings of blood chemistry: Secondary | ICD-10-CM | POA: Diagnosis not present

## 2022-02-13 LAB — CBC
HCT: 49.4 % (ref 39.0–52.0)
Hemoglobin: 16.6 g/dL (ref 13.0–17.0)
MCH: 27.5 pg (ref 26.0–34.0)
MCHC: 33.6 g/dL (ref 30.0–36.0)
MCV: 81.9 fL (ref 80.0–100.0)
Platelets: 234 10*3/uL (ref 150–400)
RBC: 6.03 MIL/uL — ABNORMAL HIGH (ref 4.22–5.81)
RDW: 13.6 % (ref 11.5–15.5)
WBC: 10.8 10*3/uL — ABNORMAL HIGH (ref 4.0–10.5)
nRBC: 0 % (ref 0.0–0.2)

## 2022-02-13 LAB — BASIC METABOLIC PANEL
Anion gap: 13 (ref 5–15)
BUN: 35 mg/dL — ABNORMAL HIGH (ref 8–23)
CO2: 22 mmol/L (ref 22–32)
Calcium: 10 mg/dL (ref 8.9–10.3)
Chloride: 99 mmol/L (ref 98–111)
Creatinine, Ser: 1.97 mg/dL — ABNORMAL HIGH (ref 0.61–1.24)
GFR, Estimated: 37 mL/min — ABNORMAL LOW (ref >60)
Glucose, Bld: 133 mg/dL — ABNORMAL HIGH (ref 70–99)
Potassium: 4.3 mmol/L (ref 3.5–5.1)
Sodium: 134 mmol/L — ABNORMAL LOW (ref 135–145)

## 2022-02-13 MED ORDER — FUROSEMIDE 40 MG PO TABS: 40.0000 mg | ORAL_TABLET | Freq: Every day | ORAL | Status: DC

## 2022-02-13 MED ORDER — ACETAMINOPHEN 325 MG PO TABS
650.0000 mg | ORAL_TABLET | Freq: Four times a day (QID) | ORAL | Status: DC | PRN
  Administered 2022-02-13 (×2): 650 mg via ORAL
  Filled 2022-02-13 (×3): qty 2

## 2022-02-13 MED ORDER — TORSEMIDE 20 MG PO TABS: 20.0000 mg | ORAL_TABLET | Freq: Every day | ORAL | 0 refills | Status: DC

## 2022-02-13 NOTE — Discharge Summary (Signed)
Physician Discharge Summary   Patient: Jerry Howe MRN: [id number] DOB: [birth year]  Admit date:     02/07/2022  Discharge date: 02/13/22  Discharge Physician: Max Sane   PCP: Gladstone Lighter, MD   Recommendations at discharge:   Follow-up with outpatient providers as requested  Discharge Diagnoses: Active Problems:   Chronic HFrEF (heart failure with reduced ejection fraction) (HCC)   HTN (hypertension)   NICM (nonischemic cardiomyopathy) (HCC)   Elevated troponin   Paroxysmal atrial fibrillation (HCC)   Atrial fibrillation with RVR (HCC)   ICD (implantable cardioverter-defibrillator) discharge   Acute on chronic combined systolic and diastolic CHF (congestive heart failure) [facility])  Hospital Course: [name] is a 66 year old male with history of chronic systolic heart failure, hypertension, nonischemic cardiomyopathy, ICD status, hyperlipidemia, who presents emergency department for chief concerns of defibrillator going off 3 times while at church.  Initial vitals in the ED showed temperature of 97.9, respiration rate of 14, heart rate of 96, blood pressure 113/72, SPO2 of 97% on room air.  Serum sodium is 138, potassium 4.0, chloride 104, bicarb 23, BUN of 17, serum creatinine of 1.52, GFR 51, nonfasting blood glucose 118, WBC 7.5, hemoglobin 13.4, platelets of 172, high sensitive troponin is 147.  EDP consulted cardiology, who are aware of the patient, Dr. Jonelle Sidle.  ED treatment: None  10/25: Cath planned for today 10/26: Cath showed mild nonobstructive CAD and severe pulmonary hypertension.  Ongoing diuresis.  Stopped heparin and started Eliquis 10/27: Advanced CHF c/s with Dr Haroldine Laws - recommends further Diuresis while here  Assessment and Plan: ICD (implantable cardioverter-defibrillator) discharge Paroxysmal atrial fibrillation ([place]) - Now in sinus rhythm.  Continue amiodarone 400 mg p.o. twice daily for 1 week followed by 200 mg twice daily for 1 week  and then 200 mg once daily.   -Needed heparin drip while in the hospital and transition to Eliquis at discharge - Per [employer] interrogation - confirmed 6 shocks by ICD device [employer] cardiology & Dr Haroldine Laws (Advanced CHF) seen and managed while in the hospital -Continue amiodarone, Toprol XL, Entresto and Aldactone Cardiac cath on 10/25 showed mild nonobstructive CAD, severe pulmonary hypertension -Outpatient follow-up with cardiology he likely had advanced heart failure clinic  Elevated troponin -no MI/ruled out - due to supply demand ischemia in the setting of ICD shock x6.  NICM (nonischemic cardiomyopathy) ([place]) Left and right heart cath on 10/25 showed severe pulmonary hypertension with mild nonobstructive CAD Continue Entresto, metoprolol, spironolactone. Cardio considering addition of Farxiga at DC  HTN (hypertension) - Continue Toprol-XL, Entresto, Aldactone  Chronic HFrEF (heart failure with reduced ejection fraction) (HCC) Echo showed EF of 20 to 25% Cath 10/25 showed mild nonobstructive CAD Net IO Since Admission: -3,535.85 mL [02/13/22 1600]  Euvolemic now        Consultants: Cardiology Procedures performed: Cardiac cath Disposition: Home Diet recommendation:  Discharge Diet Orders (From admission, onward)     Start     Ordered   02/12/22 0000  Diet - low sodium heart healthy        02/12/22 1254           Carb modified diet DISCHARGE MEDICATION: Allergies as of 02/13/2022       Reactions   Shellfish Allergy Anaphylaxis        Medication List     TAKE these medications    albuterol 108 (90 Base) MCG/ACT inhaler Commonly known as: VENTOLIN HFA Inhale 1 puff into the lungs every 6 (six) hours as  needed for wheezing or shortness of breath.   amiodarone 200 MG tablet Commonly known as: PACERONE Take 2 tablets (400 mg total) by mouth 2 (two) times daily. amiodarone 400 twice daily 1 week then down to 200 twice daily 1 week then down to 200  daily   apixaban 5 MG Tabs tablet Commonly known as: ELIQUIS Take 1 tablet (5 mg total) by mouth 2 (two) times daily.   cyanocobalamin 2000 MCG tablet Take 2,000 mcg by mouth daily.   Magnesium 200 MG Tabs Take 200 mg by mouth daily.   metoprolol succinate 25 MG 24 hr tablet Commonly known as: TOPROL-XL Take 25 mg by mouth daily.   Nourish Liqd Take by mouth.   omeprazole 20 MG capsule Commonly known as: PRILOSEC Take by mouth.   pyridoxine 500 MG tablet Commonly known as: B-6 Take 500 mg by mouth daily.   rosuvastatin 10 MG tablet Commonly known as: CRESTOR Take 1 tablet (10 mg total) by mouth at bedtime.   sacubitril-valsartan 24-26 MG Commonly known as: ENTRESTO Take 1 tablet (24-26 mg) by mouth twice daily   spironolactone 25 MG tablet Commonly known as: ALDACTONE Take 1 tablet by mouth daily.   SUPER THERA VITE M PO Take 1 tablet by mouth daily.   torsemide 20 MG tablet Commonly known as: DEMADEX Take 1 tablet (20 mg total) by mouth daily.   vitamin C 1000 MG tablet Take 1,000 mg by mouth daily.        Follow-up Information     Enid Baas, MD. Go in 1 week(s).   Specialty: Internal Medicine Why: Appointment on Wednesday, 02/17/2022 at 2:45pm Contact information: [address] [place] [address] [phone]         Antonieta Iba, MD. Go in 2 week(s).   Specialty: Cardiology Why: Appointment on Friday, 03/05/2022 at 1:30pm with Ashley Akin. Contact information: [address] [place] [address] [phone]         Pricilla Loveless, MD .   Specialty: Emergency Medicine Contact information: [address] [place] [address] [phone]                Discharge Exam: Ceasar Mons Weights   02/07/22 1203 02/10/22 1050 02/12/22 0945  Weight: 102.1 kg 102.1 kg 75.46 kg   66 year old male sitting in the bed comfortably without any acute distress Lungs clear to auscultation  bilaterally Cardiovascular regular rate and rhythm Abdomen soft, benign Neuro alert and oriented, nonfocal Skin no rash or lesion  Condition at discharge: good  The results of significant diagnostics from this hospitalization (including imaging, microbiology, ancillary and laboratory) are listed below for reference.   Imaging Studies: CARDIAC CATHETERIZATION  Addendum Date: 02/10/2022   Conclusions: Mild, nonobstructive coronary artery disease with 10-20% stenosis involving the proximal LAD.  No significant disease involving ramus intermedius, LCx, or RCA. Moderately-severely elevated left heart filling pressures (PCWP 30 mmHg, LVEDP 40 mmHg).  Prominent V waves on PCWP tracing suggest moderate-severe mitral regurgitation. Severe pulmonary hypertension (mean PAP 50 mmHg, PVR 4.3 WU). Moderately elevated right heart filling pressure (mean RA 14 mmHg, RV EDP 13 mmHg). Mildly reduced Fick cardiac output/index (CO 4.7 L/min, CI 2.2 L/min/m^2). 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 Escalate goal-directed medical therapy as blood pressure and renal function allow.  Consultation with advanced heart failure team recommended (can be done on an inpatient or outpatient basis based on clinical course). Medical therapy  and risk factor modification to prevent progression of mild CAD. Restart IV heparin 2 hours after Zephyr band has been removed.  Start apixaban 5 mg twice daily tomorrow morning if no evidence of bleeding/vascular injury. Yvonne Kendall, MD [facility]   Result Date: 02/10/2022 Conclusions: Mild, nonobstructive coronary artery disease with 10-20% stenosis involving the proximal LAD.  No significant disease involving ramus intermedius, LCx, or RCA. Moderately-severely elevated left heart filling pressures (PCWP 30 mmHg, LVEDP 40 mmHg).  Prominent V waves on PCWP tracing suggest moderate-severe mitral regurgitation. Severe  pulmonary hypertension (mean PAP 50 mmHg, PVR 4.3 WU). Moderately elevated right heart filling pressure (mean RA 14 mmHg, RV EDP 13 mmHg). 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 Escalate goal-directed medical therapy as blood pressure and renal function allow.  Consultation with advanced heart failure team recommended (can be done on an inpatient or outpatient basis based on clinical course). Medical therapy and risk factor modification to prevent progression of mild CAD. Yvonne Kendall, MD [facility]  ECHOCARDIOGRAM COMPLETE  Result Date: 02/09/2022    ECHOCARDIOGRAM REPORT   Patient Name:   ELDREDGE VELDHUIZEN Date of Exam: 02/09/2022 Medical Rec #:  [id number]     Height:       68.0 in Accession #:    [id number]    Weight:       225.0 lb Date of Birth:  11-17-1955    BSA:          2.149 m Patient Age:    65 years      BP:           93/67 mmHg Patient Gender: M             HR:           55 bpm. Exam Location:  [facility] Procedure: 2D Echo, Cardiac Doppler, Color Doppler and Intracardiac            Opacification Agent Indications:     Atrial Fibrillation I48.91  History:         Patient has prior history of Echocardiogram examinations, most                  recent 12/28/2016. CHF, Defibrillator, Signs/Symptoms:Shortness                  of Breath and Dyspnea; Risk Factors:Hypertension.  Sonographer:     Cristela Blue Referring Phys:  [id number] Diagnosing Phys: Yvonne Kendall MD  Sonographer Comments: Suboptimal apical window. IMPRESSIONS  1. Left ventricular ejection fraction, by estimation, is 20 to 25%. The left ventricle has severely decreased function. The left ventricle demonstrates global hypokinesis. The left ventricular internal cavity size was severely dilated. There is mild left ventricular hypertrophy. Left ventricular diastolic parameters are consistent with Grade III diastolic dysfunction (restrictive). Elevated left atrial  pressure.  2. Right ventricular systolic function is normal. The right ventricular size is normal. There is normal pulmonary artery systolic pressure.  3. Left atrial size was moderately dilated.  4. Right atrial size was mildly dilated.  5. A small pericardial effusion is present. The pericardial effusion is circumferential.  6. The mitral valve is normal in structure. Mild to moderate mitral valve regurgitation.  7. The aortic valve is tricuspid. There is mild thickening of the aortic valve. Aortic valve regurgitation is mild. Aortic valve sclerosis is present, with no evidence of aortic valve stenosis.  8. The inferior vena cava is normal  in size with <50% respiratory variability, suggesting right atrial pressure of 8 mmHg. FINDINGS  Left Ventricle: Left ventricular ejection fraction, by estimation, is 20 to 25%. The left ventricle has severely decreased function. The left ventricle demonstrates global hypokinesis. Definity contrast agent was given IV to delineate the left ventricular endocardial borders. The left ventricular internal cavity size was severely dilated. There is mild left ventricular hypertrophy. Left ventricular diastolic parameters are consistent with Grade III diastolic dysfunction (restrictive). Elevated  left atrial pressure. Right Ventricle: The right ventricular size is normal. No increase in right ventricular wall thickness. Right ventricular systolic function is normal. There is normal pulmonary artery systolic pressure. The tricuspid regurgitant velocity is 2.17 m/s, and  with an assumed right atrial pressure of 3 mmHg, the estimated right ventricular systolic pressure is 21.8 mmHg. Left Atrium: Left atrial size was moderately dilated. Right Atrium: Right atrial size was mildly dilated. Pericardium: A small pericardial effusion is present. The pericardial effusion is circumferential. Mitral Valve: The mitral valve is normal in structure. There is mild thickening of the mitral valve  leaflet(s). Mild mitral annular calcification. Mild to moderate mitral valve regurgitation. Tricuspid Valve: The tricuspid valve is not well visualized. Tricuspid valve regurgitation is trivial. Aortic Valve: The aortic valve is tricuspid. There is mild thickening of the aortic valve. Aortic valve regurgitation is mild. Aortic valve sclerosis is present, with no evidence of aortic valve stenosis. Aortic valve mean gradient measures 2.0 mmHg. Aortic valve peak gradient measures 4.0 mmHg. Aortic valve area, by VTI measures 1.51 cm. Pulmonic Valve: The pulmonic valve was normal in structure. Pulmonic valve regurgitation is not visualized. No evidence of pulmonic stenosis. Aorta: The aortic root is normal in size and structure. Pulmonary Artery: The pulmonary artery is not well seen. Venous: The inferior vena cava is normal in size with less than 50% respiratory variability, suggesting right atrial pressure of 8 mmHg. IAS/Shunts: The interatrial septum was not well visualized. Additional Comments: A device lead is visualized in the right ventricle.  LEFT VENTRICLE PLAX 2D LVIDd:         7.50 cm      Diastology LVIDs:         7.00 cm      LV e' medial:    3.92 cm/s LV PW:         1.10 cm      LV E/e' medial:  25.3 LV IVS:        1.20 cm      LV e' lateral:   6.74 cm/s LVOT diam:     2.30 cm      LV E/e' lateral: 14.7 LV SV:         22 LV SV Index:   10 LVOT Area:     4.15 cm  LV Volumes (MOD) LV vol d, MOD A2C: 307.0 ml LV vol d, MOD A4C: 361.0 ml LV vol s, MOD A2C: 222.0 ml LV vol s, MOD A4C: 289.0 ml LV SV MOD A2C:     85.0 ml LV SV MOD A4C:     361.0 ml LV SV MOD BP:      78.0 ml RIGHT VENTRICLE RV Basal diam:  3.40 cm RV Mid diam:    3.10 cm RV S prime:     11.30 cm/s TAPSE (M-mode): 3.4 cm LEFT ATRIUM              Index        RIGHT ATRIUM  Index LA diam:        4.80 cm  2.23 cm/m   RA Area:     21.60 cm LA Vol (A2C):   98.2 ml  45.70 ml/m  RA Volume:   57.20 ml  26.62 ml/m LA Vol (A4C):   99.0 ml   46.08 ml/m LA Biplane Vol: 105.0 ml 48.87 ml/m  AORTIC VALVE AV Area (Vmax):    1.49 cm AV Area (Vmean):   1.30 cm AV Area (VTI):     1.51 cm AV Vmax:           100.30 cm/s AV Vmean:          67.267 cm/s AV VTI:            0.144 m AV Peak Grad:      4.0 mmHg AV Mean Grad:      2.0 mmHg LVOT Vmax:         35.90 cm/s LVOT Vmean:        21.000 cm/s LVOT VTI:          0.052 m LVOT/AV VTI ratio: 0.36  AORTA Ao Root diam: 3.15 cm MITRAL VALVE               TRICUSPID VALVE MV Area (PHT): 4.96 cm    TR Peak grad:   18.8 mmHg MV Decel Time: 153 msec    TR Vmax:        217.00 cm/s MV E velocity: 99.00 cm/s                            SHUNTS                            Systemic VTI:  0.05 m                            Systemic Diam: 2.30 cm Yvonne Kendall MD Electronically signed by Yvonne Kendall MD Signature Date/Time: 02/09/2022/1:07:26 PM    Final    DG Chest Portable 1 View  Result Date: 02/07/2022 CLINICAL DATA:  Defibrillator activation this morning EXAM: PORTABLE CHEST 1 VIEW COMPARISON:  08/16/2015 FINDINGS: Single chamber defibrillator lead into the right ventricle. Stable lead positioning. Stable cardiomegaly. Normal mediastinal contours. There is no edema, consolidation, effusion, or pneumothorax. IMPRESSION: No acute finding.  Cardiomegaly. Electronically Signed   By: Tiburcio Pea M.D.   On: 02/07/2022 12:13   CUP PACEART INCLINIC DEVICE CHECK  Result Date: 01/27/2022 ICD check in clinic. Normal device function. Thresholds and sensing consistent with previous device measurements. Impedance trends stable over time. Brief ventricular arrhythmias-see attachment. Histogram distribution appropriate for patient and level of  activity. No changes made this session. Device programmed at appropriate safety margins. Device programmed to optimize intrinsic conduction. Estimated longevity 5.5 years. Pt enrolled in remote follow-up. Patient education completed including shock plan. Auditory/vibratory alert  demonstrated.Ancil Boozer, BSN, RN   Microbiology: Results for orders placed or performed during the hospital encounter of 02/07/22  SARS Coronavirus 2 by RT PCR (hospital order, performed in [facility] hospital lab) *cepheid single result test* Anterior Nasal Swab     Status: None   Collection Time: 02/07/22  5:03 PM   Specimen: Anterior Nasal Swab  Result Value Ref Range Status   SARS Coronavirus 2 by RT PCR NEGATIVE NEGATIVE Final    Comment: (NOTE) SARS-CoV-2  target nucleic acids are NOT DETECTED.  The SARS-CoV-2 RNA is generally detectable in upper and lower respiratory specimens during the acute phase of infection. The lowest concentration of SARS-CoV-2 viral copies this assay can detect is 250 copies / mL. A negative result does not preclude SARS-CoV-2 infection and should not be used as the sole basis for treatment or other patient management decisions.  A negative result may occur with improper specimen collection / handling, submission of specimen other than nasopharyngeal swab, presence of viral mutation(s) within the areas targeted by this assay, and inadequate number of viral copies (<250 copies / mL). A negative result must be combined with clinical observations, patient history, and epidemiological information.  Fact Sheet for Patients:   RoadLapTop.co.za  Fact Sheet for Healthcare Providers: http://kim-miller.com/  This test is not yet approved or  cleared by the [place] FDA and has been authorized for detection and/or diagnosis of SARS-CoV-2 by FDA under an Emergency Use Authorization (EUA).  This EUA will remain in effect (meaning this test can be used) for the duration of the COVID-19 declaration under Section 564(b)(1) of the Act, 21 U.S.C. section 360bbb-3(b)(1), unless the authorization is terminated or revoked sooner.  Performed at [facility], [address] [place], [place] [address]      Labs: CBC: Recent Labs  Lab 02/07/22 1221 02/08/22 0520 02/10/22 0610 02/10/22 1233 02/10/22 1236 02/11/22 0401 02/12/22 0606 02/13/22 0410  WBC 7.5 7.6 8.3  --   --  9.0 8.9 10.8*  NEUTROABS 5.7  --   --   --   --   --   --   --   HGB 13.4 13.8 14.5 15.3 15.3 15.0 15.6 16.6  HCT 41.9 41.4 43.6 45.0 45.0 45.0 46.8 49.4  MCV 83.6 82.5 81.8  --   --  81.4 82.0 81.9  PLT 172 175 185  --   --  214 217 234   Basic Metabolic Panel: Recent Labs  Lab 02/07/22 1221 02/07/22 1703 02/08/22 0520 02/10/22 1233 02/10/22 1236 02/11/22 0401 02/12/22 0753 02/13/22 0410  NA 138  --  137 135 134* 135 136 134*  K 4.0  --  4.3 4.4 4.5 4.2 4.1 4.3  CL 104  --  106  --   --  105 102 99  CO2 23  --  23  --   --  22 24 22   GLUCOSE 118*  --  94  --   --  102* 117* 133*  BUN 17  --  18  --   --  20 29* 35*  CREATININE 1.52*  --  1.44*  --   --  1.64* 1.85* 1.97*  CALCIUM 9.1  --  8.9  --   --  9.6 9.9 10.0  MG  --  2.0 2.0  --   --   --   --   --   PHOS  --  3.4  --   --   --   --   --   --    Liver Function Tests: Recent Labs  Lab 02/07/22 1221  AST 24  ALT 15  ALKPHOS 62  BILITOT 0.8  PROT 7.1  ALBUMIN 3.8   CBG: No results for input(s): "GLUCAP" in the last 168 hours.  Discharge time spent: greater than 30 minutes.  Signed: 02/09/22, MD [facility] 02/13/2022

## 2022-02-13 NOTE — Progress Notes (Signed)
Rounding Note    Patient Name: Jerry Howe Date of Encounter: 02/13/2022  [facility] Cardiologist: Julien Nordmann, MD   Subjective   Seen by [facility] team and lasix increased. Scr/BUN mildly up this AM. UOP -2.5L. Patient wanting to leave. Denies chest pain or SOB.   Inpatient Medications    Scheduled Meds:  amiodarone  400 mg Oral BID   apixaban  5 mg Oral BID   vitamin C  1,000 mg Oral Daily   cyanocobalamin  2,000 mcg Oral Daily   furosemide  80 mg Intravenous BID   metoprolol succinate  25 mg Oral Daily   pantoprazole  40 mg Oral Daily   potassium chloride SA  20 mEq Oral BID   pyridoxine  500 mg Oral Daily   rosuvastatin  10 mg Oral QHS   sacubitril-valsartan  1 tablet Oral BID   sodium chloride flush  3 mL Intravenous Q12H   spironolactone  25 mg Oral Daily   Continuous Infusions:  sodium chloride     PRN Meds: sodium chloride, acetaminophen, albuterol, mouth rinse, pneumococcal 20-valent conjugate vaccine, senna-docusate, sodium chloride flush   Vital Signs    Vitals:   02/12/22 2249 02/13/22 0121 02/13/22 0440 02/13/22 0747  BP: 118/61 113/79 109/65 114/65  Pulse: 62 66 (!) 57 60  Resp: 18 20 20 16   Temp:  98 F (36.7 C) 97.9 F (36.6 C) 97.8 F (36.6 C)  TempSrc:  Oral Oral   SpO2: 99% 99% 100% 96%  Weight:      Height:        Intake/Output Summary (Last 24 hours) at 02/13/2022 0851 Last data filed at 02/13/2022 0600 Gross per 24 hour  Intake 363 ml  Output 2950 ml  Net -2587 ml      02/12/2022    9:45 AM 02/10/2022   10:50 AM 02/07/2022   12:03 PM  Last 3 Weights  Weight (lbs) 207 lb 14.4 oz 225 lb 1.4 oz 225 lb  Weight (kg) 94.303 kg 102.1 kg 102.059 kg      Telemetry    SB, HR 50s, PVCs - Personally Reviewed  ECG    No new - Personally Reviewed  Physical Exam   GEN: No acute distress.   Neck: No JVD Cardiac: RRR, no murmurs, rubs, or gallops.  Respiratory: Clear to auscultation bilaterally. GI: Soft,  nontender, non-distended  MS: No edema; No deformity. Neuro:  Nonfocal  Psych: Normal affect   Labs    High Sensitivity Troponin:   Recent Labs  Lab 02/07/22 1221 02/07/22 1447  TROPONINIHS 147* 702*     Chemistry Recent Labs  Lab 02/07/22 1221 02/07/22 1703 02/08/22 0520 02/10/22 1233 02/11/22 0401 02/12/22 0753 02/13/22 0410  NA 138  --  137   < > 135 136 134*  K 4.0  --  4.3   < > 4.2 4.1 4.3  CL 104  --  106  --  105 102 99  CO2 23  --  23  --  22 24 22   GLUCOSE 118*  --  94  --  102* 117* 133*  BUN 17  --  18  --  20 29* 35*  CREATININE 1.52*  --  1.44*  --  1.64* 1.85* 1.97*  CALCIUM 9.1  --  8.9  --  9.6 9.9 10.0  MG  --  2.0 2.0  --   --   --   --   PROT 7.1  --   --   --   --   --   --  ALBUMIN 3.8  --   --   --   --   --   --   AST 24  --   --   --   --   --   --   ALT 15  --   --   --   --   --   --   ALKPHOS 62  --   --   --   --   --   --   BILITOT 0.8  --   --   --   --   --   --   GFRNONAA 51*  --  54*  --  46* 40* 37*  ANIONGAP 11  --  8  --  8 10 13    < > = values in this interval not displayed.    Lipids No results for input(s): "CHOL", "TRIG", "HDL", "LABVLDL", "LDLCALC", "CHOLHDL" in the last 168 hours.  Hematology Recent Labs  Lab 02/11/22 0401 02/12/22 0606 02/13/22 0410  WBC 9.0 8.9 10.8*  RBC 5.53 5.71 6.03*  HGB 15.0 15.6 16.6  HCT 45.0 46.8 49.4  MCV 81.4 82.0 81.9  MCH 27.1 27.3 27.5  MCHC 33.3 33.3 33.6  RDW 13.5 13.5 13.6  PLT 214 217 234   Thyroid  Recent Labs  Lab 02/07/22 1703  TSH 0.835    BNP Recent Labs  Lab 02/07/22 1221  BNP 300.5*    DDimer No results for input(s): "DDIMER" in the last 168 hours.   Radiology    No results found.  Cardiac Studies   R/L heart cath 02/10/22 Conclusions: Mild, nonobstructive coronary artery disease with 10-20% stenosis involving the proximal LAD.  No significant disease involving ramus intermedius, LCx, or RCA. Moderately-severely elevated left heart filling  pressures (PCWP 30 mmHg, LVEDP 40 mmHg).  Prominent V waves on PCWP tracing suggest moderate-severe mitral regurgitation. Severe pulmonary hypertension (mean PAP 50 mmHg, PVR 4.3 WU). Moderately elevated right heart filling pressure (mean RA 14 mmHg, RV EDP 13 mmHg). Mildly reduced Fick cardiac output/index (CO 4.7 L/min, CI 2.2 L/min/m^2).   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 Escalate goal-directed medical therapy as blood pressure and renal function allow.  Consultation with advanced heart failure team recommended (can be done on an inpatient or outpatient basis based on clinical course). Medical therapy and risk factor modification to prevent progression of mild CAD. Restart IV heparin 2 hours after Zephyr band has been removed.  Start apixaban 5 mg twice daily tomorrow morning if no evidence of bleeding/vascular injury.   02/12/22, MD [facility]   Recommendations   Antiplatelet/Anticoag Recommend to resume Apixaban, at currently prescribed dose and frequency on 02/11/2022. Concurrent antiplatelet therapy not recommended.  Discharge Date Anticipated discharge date to be determined.      2D echo 02/09/2022: 1. Left ventricular ejection fraction, by estimation, is 20 to 25%. The  left ventricle has severely decreased function. The left ventricle  demonstrates global hypokinesis. The left ventricular internal cavity size  was severely dilated. There is mild  left ventricular hypertrophy. Left ventricular diastolic parameters are  consistent with Grade III diastolic dysfunction (restrictive). Elevated  left atrial pressure.   2. Right ventricular systolic function is normal. The right ventricular  size is normal. There is normal pulmonary artery systolic pressure.   3. Left atrial size was moderately dilated.   4. Right atrial size was mildly dilated.   5. A small pericardial effusion  is present. The  pericardial effusion is  circumferential.   6. The mitral valve is normal in structure. Mild to moderate mitral valve  regurgitation.   7. The aortic valve is tricuspid. There is mild thickening of the aortic  valve. Aortic valve regurgitation is mild. Aortic valve sclerosis is  present, with no evidence of aortic valve stenosis.   8. The inferior vena cava is normal in size with <50% respiratory  variability, suggesting right atrial pressure of 8 mmHg.   Patient Profile     66 y.o. 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  Assessment & Plan    Chronic combined systolic and diastolic CHF 2/2 to NICM s/p ICD with device firing - patient with [employer] single chamber ICD that fired 6 times on 02/07/22 with report showing it was for VT/VF - presented with Afib RVR with subsequent spontaneous conversion - Evaluated by EP this admission who felt initial shock felt to be secondary to Afib RVR resulting in initially irregular than regularizing tachycardia suggesting afib or aflutter - EP resumed amiodarone - R/L heart cath showed mild nonobstructive CAD with moderately elevated left heart filling pressures, mod to severe MR, severe pulmonary HTN, moderately elevated right heart filling pressures - Advanced heart failure team saw and increased lasix to 80mg  BID for LVEDP >40 - Scr/BUN mildly worse this AM, continue to monitor kidney function - continue Toprol, Entresto, and spironolactone. Continue GMDT as able - he will continue to follow with ACHF team - PTA not on lasix>plan for lasix 40mg  at discharge   Newly diagnosed Afib - in NSR s/p spontaneous cardioversion - CHADSVASC of at least 4 - continue Eliquis 5mg  BID   Elevated troponin - heart cath showed nonobstructive disease - ASA held for Eliquis - continue Crestor and Toprol  For questions or  updates, please contact [place] Please consult [email or web address] for contact info under        Signed, Hortense Cantrall Ninfa Meeker, PA-C  02/13/2022, 8:51 AM

## 2022-02-15 ENCOUNTER — Telehealth: Payer: Self-pay | Admitting: *Deleted

## 2022-02-15 NOTE — Telephone Encounter (Signed)
-----   Message from [place], PA-C sent at 02/13/2022 10:52 AM EDT ----- Regarding: hosp f/u Dr. Haroldine Laws wants to see patient in advanced heart failure clinic this week. Please scheudle

## 2022-02-16 ENCOUNTER — Ambulatory Visit: Payer: No Typology Code available for payment source | Admitting: Internal Medicine

## 2022-02-16 NOTE — Telephone Encounter (Signed)
Pt n/s for appt today (10/31) with dr Haroldine Laws, attempted to reach pt to resch and Left message to call back  on both numbers in chart

## 2022-02-17 ENCOUNTER — Ambulatory Visit: Payer: No Typology Code available for payment source | Admitting: Family

## 2022-02-18 ENCOUNTER — Ambulatory Visit: Payer: No Typology Code available for payment source | Admitting: Family

## 2022-02-19 NOTE — Telephone Encounter (Signed)
Appt resch to 11/6

## 2022-02-21 NOTE — Progress Notes (Signed)
ADVANCED HF CLINIC NOTE   PCP: Enid Baas, MD Primary Cardiologist: Julien Nordmann, MD   HPI:   Jerry Howe is a 66 y/o male with systolic HF due to NICM, PAF, HTN he is being seen today for evaluation of heart failure at the request of Dr. Delfino Lovett   Jerry Howe was diagnosed with heart failure in 2016.  LHC at that time revealed minimal CAD.  LVEF was 25%.  He was followed by [facility] clinic and recently transferred to [facility] heart care.  He established with Dr. Mariah Milling on 12/2021 and Dr. Lalla Brothers on 01/2022.  He reported NYHA Class II symptoms and lisinopril switch to Entresto.  He remained on spironolactone and metoprolol.  He established with Dr. Lalla Brothers on 01/2022 he was stable and doing well.  He has no history of atrial fibrillation.   Echo 3/20 EF 25-30% LVIDD 7.6 Echo 02/09/22  EF 20-25% RV ok  LVIDD 7.5   At baseline NYHA III. Says he stopped lasix earlier this year and got worse but restarted 2 months ago and was a bit better.   He presented to the ED on 10/22 after his defibrillator fired 6 times at [employer] after he got into a heated discussion.  In the ED he was noted to be in atrial fibrillation.  He converted on his own. Seen by Dr. Graciela Husbands and ICD interogation showed inappropriate ICD firing for AF. Started on amio. Also found to have frequent PVCs.    Cath 02/10/22  LAD 10-20%prox. 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 Lately they have been asking him to do more intense labor and he gets short of breath and has to stop.  He denies chest pain.  Denies snoring. Never test for OSA.    No ETOH, drugs or family h/o CHF.    ROS: All systems negative except as listed in HPI, PMH and Problem List.  SH:  Social History   Socioeconomic History   Marital status: Married    Spouse name: Cala Bradford    Number of children: 5   Years of education: Not on file   Highest education level: Not on file  Occupational History   Not on file  Tobacco Use   Smoking status: Never   Smokeless tobacco: Never  Vaping Use   Vaping Use: Never used  Substance and Sexual Activity   Alcohol use: No   Drug use: No   Sexual activity: Yes    Partners: Female  Other Topics Concern   Not on file  Social History Narrative   Lives at home with spouse; 5 kids, 11 grandkids & 2 great grandkids   Social Determinants of Health   Financial Resource Strain: Not on file  Food Insecurity: No Food Insecurity (02/08/2022)   Hunger Vital Sign    Worried About Running Out of Food in the Last Year: Never true    Ran Out of Food in the Last Year: Never true  Transportation Needs: No Transportation Needs (02/08/2022)   PRAPARE - Administrator, Civil Service (Medical): No    Lack of Transportation (Non-Medical): No  Physical Activity: Not on file  Stress: Not on file  Social Connections: Not on file  Intimate Partner Violence: Not At  Risk (02/08/2022)   Humiliation, Afraid, Rape, and Kick questionnaire    Fear of Current or Ex-Partner: No    Emotionally Abused: No    Physically Abused: No    Sexually Abused: No    FH:  Family History  Problem Relation Age of Onset   Hyperlipidemia Mother    Hypertension Father    Heart disease Father    Hyperlipidemia Brother    Diabetes Brother    Heart failure Neg Hx     Past Medical History:  Diagnosis Date   Anxiety    Arrhythmia    CHF (congestive heart failure) ([place])    Hypertension    Kidney stones    Shortness of breath dyspnea     Current Outpatient Medications  Medication Sig Dispense Refill   albuterol (PROVENTIL HFA;VENTOLIN HFA) 108 (90 Base) MCG/ACT inhaler Inhale 1 puff into the lungs every 6 (six) hours as needed for wheezing or shortness of breath.     amiodarone (PACERONE) 200 MG tablet Take 2 tablets (400 mg total) by mouth 2  (two) times daily. amiodarone 400 twice daily 1 week then down to 200 twice daily 1 week then down to 200 daily 120 tablet 1   apixaban (ELIQUIS) 5 MG TABS tablet Take 1 tablet (5 mg total) by mouth 2 (two) times daily. 60 tablet 0   Ascorbic Acid (VITAMIN C) 1000 MG tablet Take 1,000 mg by mouth daily.     cyanocobalamin 2000 MCG tablet Take 2,000 mcg by mouth daily.     Magnesium 200 MG TABS Take 200 mg by mouth daily.     metoprolol succinate (TOPROL-XL) 25 MG 24 hr tablet Take 25 mg by mouth daily.     Multiple Vitamins-Minerals (SUPER THERA VITE M PO) Take 1 tablet by mouth daily.     Nutritional Supplements (NOURISH) LIQD Take by mouth.     omeprazole (PRILOSEC) 20 MG capsule Take by mouth.     pyridoxine (B-6) 500 MG tablet Take 500 mg by mouth daily.     rosuvastatin (CRESTOR) 10 MG tablet Take 1 tablet (10 mg total) by mouth at bedtime. 30 tablet 0   sacubitril-valsartan (ENTRESTO) 24-26 MG Take 1 tablet (24-26 mg) by mouth twice daily 60 tablet 6   spironolactone (ALDACTONE) 25 MG tablet Take 1 tablet by mouth daily.     torsemide (DEMADEX) 20 MG tablet Take 1 tablet (20 mg total) by mouth daily. 30 tablet 0   No current facility-administered medications for this visit.    There were no vitals filed for this visit.  PHYSICAL EXAM:  General:  Well appearing. No resp difficulty HEENT: normal Neck: supple. JVP flat. Carotids 2+ bilaterally; no bruits. No lymphadenopathy or thryomegaly appreciated. Cor: PMI normal. Regular rate & rhythm. No rubs, gallops or murmurs. Lungs: clear Abdomen: soft, nontender, nondistended. No hepatosplenomegaly. No bruits or masses. Good bowel sounds. Extremities: no cyanosis, clubbing, rash, edema Neuro: alert & orientedx3, cranial nerves grossly intact. Moves all 4 extremities w/o difficulty. Affect pleasant.   ECG:   ASSESSMENT & PLAN:  1. Chronic systolic HF - due to NICM. Unclear etiology. Has not had cMRI - Echo 2016 EF 25% - Echo 3/20  EF 25-30% LVIDD 7.6 - Echo 02/09/22  EF 20-25% RV ok  LVIDD 7.5 G3DD - s/p MDT ICD - Cath 02/10/22 with minimal CAD (LAD 10-20%) markedly elevated filling pressures, severe pulmonary venous HTN and moderately reduced CO in setting of restrictive physiology - NYHA III at baseline  -  Volume status remains markedly elevated on cath but looks ok on exam and ReDS lung water in normal range at 35% - With LVEDP > 40 I think he needs an attempt at aggressive diuresis prior to d/c to see if we can get his pressures down a bit. Start lasix 80 IV bid - Given cath and echo findings, I am concerned he may need advanced therapies in the near future but severe pulmonary venous HTN is a hurdle (will need repeat RHC in next few months and possible milrinone challenge to see if we can unload and bring down PA pressures) - Continue Entresto 24/26 bid - Continue Spiro 25 - Continue Toprol 25 - Continue Jardaince - Will need CPX testing in near future to risk stratify for advanced therapies    2. PAF - now s/p ICD inappropriate shock x 6 - loading amio  - in NSR today - continue Eliquis. - EP following   3. Frequent PVCs  - loading amio    4. CKD 3a  - baseline SCr 1.3-1.5 - watch with diuresis - start Jardiance 10 prior to d/c  Arvilla Meres, MD  1:48 PM

## 2022-02-22 ENCOUNTER — Other Ambulatory Visit
Admission: RE | Admit: 2022-02-22 | Discharge: 2022-02-22 | Disposition: A | Discharge status: Home / Self Care | Payer: No Typology Code available for payment source | Source: Ambulatory Visit | Attending: Internal Medicine | Admitting: Internal Medicine

## 2022-02-22 ENCOUNTER — Other Ambulatory Visit (HOSPITAL_COMMUNITY): Payer: Self-pay

## 2022-02-22 ENCOUNTER — Ambulatory Visit (HOSPITAL_BASED_OUTPATIENT_CLINIC_OR_DEPARTMENT_OTHER): Payer: No Typology Code available for payment source | Admitting: Internal Medicine

## 2022-02-22 VITALS — BP 108/64 | HR 53 | Wt 212.0 lb

## 2022-02-22 DIAGNOSIS — I480 Paroxysmal atrial fibrillation: Secondary | ICD-10-CM | POA: Diagnosis not present

## 2022-02-22 DIAGNOSIS — I10 Essential (primary) hypertension: Secondary | ICD-10-CM

## 2022-02-22 DIAGNOSIS — I5022 Chronic systolic (congestive) heart failure: Secondary | ICD-10-CM

## 2022-02-22 LAB — BASIC METABOLIC PANEL
Anion gap: 9 (ref 5–15)
BUN: 29 mg/dL — ABNORMAL HIGH (ref 8–23)
CO2: 29 mmol/L (ref 22–32)
Calcium: 10 mg/dL (ref 8.9–10.3)
Chloride: 99 mmol/L (ref 98–111)
Creatinine, Ser: 1.97 mg/dL — ABNORMAL HIGH (ref 0.61–1.24)
GFR, Estimated: 37 mL/min — ABNORMAL LOW (ref >60)
Glucose, Bld: 116 mg/dL — ABNORMAL HIGH (ref 70–99)
Potassium: 4.5 mmol/L (ref 3.5–5.1)
Sodium: 137 mmol/L (ref 135–145)

## 2022-02-22 LAB — BRAIN NATRIURETIC PEPTIDE: B Natriuretic Peptide: 168.7 pg/mL — ABNORMAL HIGH (ref 0.0–100.0)

## 2022-02-22 MED ORDER — TORSEMIDE 20 MG PO TABS: 20.0000 mg | ORAL_TABLET | Freq: Every day | ORAL | 3 refills | Status: DC | PRN

## 2022-02-22 MED ORDER — EMPAGLIFLOZIN 10 MG PO TABS: 10.0000 mg | ORAL_TABLET | Freq: Every day | ORAL | 6 refills | Status: DC

## 2022-02-22 NOTE — Patient Instructions (Signed)
Medication Changes:  Begin taking Jardiance 10 mg daily Change how you take Torsemide to: As needed only   Lab Work:  Labs done today, your results will be available in [place], we will contact you for abnormal readings.   Testing/Procedure: Your physician has recommended that you have a cardiopulmonary stress test (CPX). CPX testing is a non-invasive measurement of heart and lung function. It replaces a traditional treadmill stress test. This type of test provides a tremendous amount of information that relates not only to your present condition but also for future outcomes. This test combines measurements of you ventilation, respiratory gas exchange in the lungs, electrocardiogram (EKG), blood pressure and physical response before, during, and following an exercise protocol.   Referrals:  No referrals  Special Instructions // Education:  Do the following things EVERYDAY: Weigh yourself in the morning before breakfast. Write it down and keep it in a log. Take your medicines as prescribed Eat low salt foods--Limit salt (sodium) to 2000 mg per day.  Stay as active as you can everyday Limit all fluids for the day to less than 2 liters   Follow-Up in:  6-8 weeks     If you have any questions or concerns before your next appointment please send [place] a message through [place] or call our office at [phone]

## 2022-02-23 ENCOUNTER — Other Ambulatory Visit (HOSPITAL_COMMUNITY): Payer: Self-pay

## 2022-02-25 ENCOUNTER — Telehealth: Payer: Self-pay

## 2022-02-25 ENCOUNTER — Encounter: Payer: Self-pay | Admitting: Pharmacist

## 2022-02-25 NOTE — Telephone Encounter (Signed)
Patient called with medication questions. He states that he has not taken amiodarone 200 mg since Sunday. He wanted to confirm this medication was to be taken as needed.  He states that he is out of torsemide.  He complains that after taking rosuvastatin at night "the room starts spinning".  Clinical pharmacist, Raquel was present during this conversation and suggested patient restart amiodarone 200 mg twice daily for this week and then go to 200 mg daily starting Monday, unless the patient hears otherwise from [place]. Informed patient per Dr. Prescott Gum note, he was to take 200 mg BID this week, and 200 mg once a day starting next week.  Per pharmacy, patient should hold rosuvastatin until he sees Cadence Furth next week. Medication added to intolerances, and pharmacy sent note to provider. Informed patient that refills are available to be picked up at his pharmacy for torsemide.  Patient verbalized understanding and states he will resume amiodarone twice a day for this week and plan to begin taking once daily next week. States he will pick up torsemide and hold rosuvastatin. Patient verbalized thankfulness for advice. He had no further questions or concerns at this time. Suanne Marker, RN

## 2022-02-26 ENCOUNTER — Encounter: Payer: Self-pay | Admitting: Internal Medicine

## 2022-03-05 ENCOUNTER — Ambulatory Visit: Payer: No Typology Code available for payment source | Admitting: Medical

## 2022-03-08 ENCOUNTER — Telehealth: Payer: Self-pay

## 2022-03-08 ENCOUNTER — Other Ambulatory Visit: Payer: Self-pay

## 2022-03-08 MED ORDER — TORSEMIDE 20 MG PO TABS: 20.0000 mg | ORAL_TABLET | Freq: Every day | ORAL | 0 refills | Status: DC | PRN

## 2022-03-08 NOTE — Telephone Encounter (Signed)
Patient's wife called to report they are out of state this week until 11/25, and patient's torsemide 20 mg PRN daily was left at home.  Patient's wife asked for Rx refill to be [facility] to [facility] [place].  5 pill ref[facility] to [facility] of [facility] request.  Suanne Marker, RN

## 2022-03-16 NOTE — Progress Notes (Unsigned)
Electrophysiology Office Follow up Visit Note:    Date:  03/17/2022   ID:  [name], DOB 06/12/1955, MRN [id number]  PCP:  Gladstone Lighter, MD  [facility] Cardiologist:  Ida Rogue, MD  [facility] Electrophysiologist:  Vickie Epley, MD    Interval History:    KSHAWN GARRY is a 66 y.o. male who presents for a follow up visit. They were last seen in clinic 01/27/2022 to establish him with our clinic for remote monitoring of his ICD.  After our appointment he was admitted 02/07/2022 with new diagnosis AF with RVR and inappropriate shocks. He was started on amiodarone for rhythm control and is seeing me today to discuss possible ablation to help avoid long term exposure to amiodarone.  Today he tells me he was at church when his ICD shocked him.  He did not know what was happening when the shock started occurring.  It shocked him at least 3 times that he recalls.  He never lost consciousness.  He takes his Eliquis twice daily.  He is now taking amiodarone for rhythm control and has not had another episode of arrhythmia that he is aware of.  He tells me that he feels significantly better since starting medical therapy and starting to watch his diet and fluid intake after his hospitalization.  His heart failure symptoms were at least class III at their worst.  He was having significant fluid retention in his abdomen and could barely walk up a flight of steps.  Now he is back exercising without any shortness of breath or limiting symptoms.  He is overall very happy with his progress.       Past Medical History:  Diagnosis Date   Anxiety    Arrhythmia    CHF (congestive heart failure) ([place])    Hypertension    Kidney stones    Shortness of breath dyspnea     Past Surgical History:  Procedure Laterality Date   CARDIAC CATHETERIZATION Right 11/20/2014   Procedure: Left Heart Cath and Coronary Angiography;  Surgeon: Isaias Cowman, MD;  Location: [facility];  Service: Cardiovascular;  Laterality: Right;   IMPLANTABLE CARDIOVERTER DEFIBRILLATOR (ICD) GENERATOR CHANGE Left 08/15/2015   Procedure: ICD IMPLANT, single chamber;  Surgeon: Marzetta Board, MD;  Location: [facility];  Service: Cardiovascular;  Laterality: Left;   KNEE SURGERY     RIGHT/LEFT HEART CATH AND CORONARY ANGIOGRAPHY N/A 02/10/2022   Procedure: RIGHT/LEFT HEART CATH AND CORONARY ANGIOGRAPHY;  Surgeon: Nelva Bush, MD;  Location: [place];  Service: Cardiovascular;  Laterality: N/A;    Current Medications: Current Meds  Medication Sig   albuterol (PROVENTIL HFA;VENTOLIN HFA) 108 (90 Base) MCG/ACT inhaler Inhale 1 puff into the lungs every 6 (six) hours as needed for wheezing or shortness of breath.   amiodarone (PACERONE) 200 MG tablet Take 2 tablets (400 mg total) by mouth 2 (two) times daily. amiodarone 400 twice daily 1 week then down to 200 twice daily 1 week then down to 200 daily   Ascorbic Acid (VITAMIN C) 1000 MG tablet Take 1,000 mg by mouth daily.   cyanocobalamin 2000 MCG tablet Take 2,000 mcg by mouth daily.   empagliflozin (JARDIANCE) 10 MG TABS tablet Take 1 tablet (10 mg total) by mouth daily before breakfast.   Magnesium 200 MG TABS Take 200 mg by mouth daily.   metoprolol succinate (TOPROL-XL) 25 MG 24 hr tablet Take 25 mg by mouth daily.   Multiple Vitamins-Minerals (SUPER  THERA VITE M PO) Take 1 tablet by mouth daily.   omeprazole (PRILOSEC) 20 MG capsule Take by mouth.   pyridoxine (B-6) 500 MG tablet Take 500 mg by mouth daily.   sacubitril-valsartan (ENTRESTO) 24-26 MG Take 1 tablet (24-26 mg) by mouth twice daily   spironolactone (ALDACTONE) 25 MG tablet Take 1 tablet by mouth daily.   torsemide (DEMADEX) 20 MG tablet Take 1 tablet (20 mg total) by mouth daily as needed.   torsemide (DEMADEX) 20 MG tablet Take 1 tablet (20 mg total) by mouth daily as needed.     Allergies:   Shellfish allergy and Rosuvastatin   Social History    Socioeconomic History   Marital status: Married    Spouse name: Cala Bradford   Number of children: 5   Years of education: Not on file   Highest education level: Not on file  Occupational History   Not on file  Tobacco Use   Smoking status: Never   Smokeless tobacco: Never  Vaping Use   Vaping Use: Never used  Substance and Sexual Activity   Alcohol use: No   Drug use: No   Sexual activity: Yes    Partners: Female  Other Topics Concern   Not on file  Social History Narrative   Lives at home with spouse; 5 kids, 11 grandkids & 2 great grandkids   Social Determinants of Health   Financial Resource Strain: Not on file  Food Insecurity: No Food Insecurity (02/08/2022)   Hunger Vital Sign    Worried About Running Out of Food in the Last Year: Never true    Ran Out of Food in the Last Year: Never true  Transportation Needs: No Transportation Needs (02/08/2022)   PRAPARE - Administrator, Civil Service (Medical): No    Lack of Transportation (Non-Medical): No  Physical Activity: Not on file  Stress: Not on file  Social Connections: Not on file     Family History: The patient's family history includes Diabetes in his brother; Heart disease in his father; Hyperlipidemia in his brother and mother; Hypertension in his father. There is no history of Heart failure.  ROS:   Please see the history of present illness.    All other systems reviewed and are negative.  EKGs/Labs/Other Studies Reviewed:    The following studies were reviewed today:  March 17, 2022 in clinic device interrogation personally reviewed Lead and battery parameters stable No additional high-voltage therapies since hospitalization  02/09/2022 Echo EF 20-25% RV normal Small Peff Mild-mod MR  02/10/2022 [facility] Conclusions: Mild, nonobstructive coronary artery disease with 10-20% stenosis involving the proximal LAD.  No significant disease involving ramus intermedius, LCx, or  RCA. Moderately-severely elevated left heart filling pressures (PCWP 30 mmHg, LVEDP 40 mmHg).  Prominent V waves on PCWP tracing suggest moderate-severe mitral regurgitation. Severe pulmonary hypertension (mean PAP 50 mmHg, PVR 4.3 WU). Moderately elevated right heart filling pressure (mean RA 14 mmHg, RV EDP 13 mmHg). Mildly reduced Fick cardiac output/index (CO 4.7 L/min, CI 2.2 L/min/m^2).    Recent Labs: 02/07/2022: ALT 15; TSH 0.835 02/08/2022: Magnesium 2.0 02/13/2022: Hemoglobin 16.6; Platelets 234 02/22/2022: B Natriuretic Peptide 168.7; BUN 29; Creatinine, Ser 1.97; Potassium 4.5; Sodium 137  Recent Lipid Panel    Component Value Date/Time   CHOL 184 11/19/2014 0443   TRIG 188 (H) 11/19/2014 0443   HDL 40 (L) 11/19/2014 0443   CHOLHDL 4.6 11/19/2014 0443   VLDL 38 11/19/2014 0443   LDLCALC 106 (H) 11/19/2014  0443    Physical Exam:    VS:  BP 118/72   Pulse 61   Ht 5\' 8"  (1.727 m)   Wt 215 lb (97.5 kg)   SpO2 98%   BMI 32.69 kg/m     Wt Readings from Last 3 Encounters:  03/17/22 215 lb (97.5 kg)  02/22/22 212 lb (96.2 kg)  02/12/22 207 lb 14.4 oz (94.3 kg)     GEN:  Well nourished, well developed in no acute distress HEENT: Normal NECK: No JVD; No carotid bruits LYMPHATICS: No lymphadenopathy CARDIAC: RRR, no murmurs, rubs, gallops.  ICD pocket well-healed RESPIRATORY:  Clear to auscultation without rales, wheezing or rhonchi  ABDOMEN: Soft, non-tender, non-distended MUSCULOSKELETAL:  No edema; No deformity  SKIN: Warm and dry NEUROLOGIC:  Alert and oriented x 3 PSYCHIATRIC:  Normal affect        ASSESSMENT:    1. Chronic systolic heart failure ([place])   2. PAF (paroxysmal atrial fibrillation) ([facility])    PLAN:    In order of problems listed above:   #Paroxysmal atrial fibrillation New diagnosis. Led to inappropriate ICD shocks. On amiodarone 200 daily  Cont eliquis  Discussed treatment options today for their AF including antiarrhythmic drug  therapy and ablation. Discussed risks, recovery and likelihood of success. Discussed potential need for repeat ablation procedures and antiarrhythmic drugs after an initial ablation. They wish to proceed with scheduling.  Risk, benefits, and alternatives to EP study and radiofrequency ablation for afib were also discussed in detail today. These risks include but are not limited to stroke, bleeding, vascular damage, tamponade, perforation, damage to the esophagus, lungs, and other structures, pulmonary vein stenosis, worsening renal function, and death. The patient understands these risk and wishes to proceed.  We will therefore proceed with catheter ablation at the next available time.  Carto, ICE, anesthesia are requested for the procedure.  Will also obtain CT PV protocol prior to the procedure to exclude LAA thrombus and further evaluate atrial anatomy.   #Chronic systolic HF Follows with DB. NYHA I today.  Warm and euvolemic today. EF 20-25% with at least moderate MR. His symptoms have improved significantly on medical therapy. Rhythm control indicated as above.  #ICD in situ Device functioning appropriately. Continue remote monitoring.       Medication Adjustments/Labs and Tests Ordered: Current medicines are reviewed at length with the patient today.  Concerns regarding medicines are outlined above.  Orders Placed This Encounter  Procedures   CT CARDIAC MORPH/PULM VEIN W/CM&W/O CA SCORE   Comprehensive metabolic panel   CBC with Differential/Platelet   EKG 12-Lead   Meds ordered this encounter  Medications   apixaban (ELIQUIS) 5 MG TABS tablet    Sig: Take 1 tablet (5 mg total) by mouth 2 (two) times daily.    Dispense:  60 tablet    Refill:  5     Signed, Lars Mage, MD, [facility], [facility] 03/17/2022 11:05 AM    Electrophysiology [place]

## 2022-03-17 ENCOUNTER — Ambulatory Visit: Payer: No Typology Code available for payment source | Attending: Cardiology | Admitting: Cardiology

## 2022-03-17 ENCOUNTER — Encounter: Payer: Self-pay | Admitting: Cardiology

## 2022-03-17 VITALS — BP 118/72 | HR 61 | Ht 68.0 in | Wt 215.0 lb

## 2022-03-17 DIAGNOSIS — I480 Paroxysmal atrial fibrillation: Secondary | ICD-10-CM

## 2022-03-17 DIAGNOSIS — I5022 Chronic systolic (congestive) heart failure: Secondary | ICD-10-CM

## 2022-03-17 MED ORDER — APIXABAN 5 MG PO TABS: 5.0000 mg | ORAL_TABLET | Freq: Two times a day (BID) | ORAL | 5 refills | Status: DC

## 2022-03-17 NOTE — Patient Instructions (Signed)
Medication Instructions:  Your physician recommends that you continue on your current medications as directed. Please refer to the Current Medication list given to you today.  *If you need a refill on your cardiac medications before your next appointment, please call your pharmacy*  Lab Work: CMET and CBC prior to CT scan If you have labs (blood work) drawn today and your tests are completely normal, you will receive your results only by: MyChart Message (if you have MyChart) OR A paper copy in the mail If you have any lab test that is abnormal or we need to change your treatment, we will call you to review the results.  Testing/Procedures: Your physician has requested that you have cardiac CT. Cardiac computed tomography (CT) is a painless test that uses an x-ray machine to take clear, detailed pictures of your heart. For further information please visit [email or web address] Please follow instruction sheet as given.  Your physician has recommended that you have an ablation. Catheter ablation is a medical procedure used to treat some cardiac arrhythmias (irregular heartbeats). During catheter ablation, a long, thin, flexible tube is put into a blood vessel in your groin (upper thigh), or neck. This tube is called an ablation catheter. It is then guided to your heart through the blood vessel. Radio frequency waves destroy small areas of heart tissue where abnormal heartbeats may cause an arrhythmia to start. Please see the instruction sheet given to you today.   Follow-Up: At [facility], you and your health needs are our priority.  As part of our continuing mission to provide you with exceptional heart care, we have created designated Provider Care Teams.  These Care Teams include your primary Cardiologist (physician) and Advanced Practice Providers (APPs -  Physician Assistants and Nurse Practitioners) who all work together to provide you with the care you need, when you need  it.  Your next appointment:   We will call you to schedule you follow up  Important Information About Sugar

## 2022-03-22 ENCOUNTER — Ambulatory Visit (HOSPITAL_COMMUNITY): Payer: No Typology Code available for payment source | Attending: Cardiology

## 2022-03-22 ENCOUNTER — Telehealth: Payer: Self-pay | Admitting: Cardiovascular Disease

## 2022-03-22 DIAGNOSIS — I5022 Chronic systolic (congestive) heart failure: Secondary | ICD-10-CM

## 2022-03-22 NOTE — Progress Notes (Deleted)
Cardiology Office Note    Date:  03/22/2022   ID:  Jerry Howe, DOB 1955/06/21, MRN [id number]  PCP:  Enid Baas, MD  Cardiologist:  Julien Nordmann, MD  Electrophysiologist:  Lanier Prude, MD   Chief Complaint: ***  History of Present Illness:   Jerry Howe is a 66 y.o. male with history of ***  ***   Labs independently reviewed: ***  Past Medical History:  Diagnosis Date   Anxiety    Arrhythmia    CHF (congestive heart failure) ([facility])    Hypertension    Kidney stones    Shortness of breath dyspnea     Past Surgical History:  Procedure Laterality Date   CARDIAC CATHETERIZATION Right 11/20/2014   Procedure: Left Heart Cath and Coronary Angiography;  Surgeon: Marcina Millard, MD;  Location: [facility];  Service: Cardiovascular;  Laterality: Right;   IMPLANTABLE CARDIOVERTER DEFIBRILLATOR (ICD) GENERATOR CHANGE Left 08/15/2015   Procedure: ICD IMPLANT, single chamber;  Surgeon: Sharion Settler, MD;  Location: [facility];  Service: Cardiovascular;  Laterality: Left;   KNEE SURGERY     RIGHT/LEFT HEART CATH AND CORONARY ANGIOGRAPHY N/A 02/10/2022   Procedure: RIGHT/LEFT HEART CATH AND CORONARY ANGIOGRAPHY;  Surgeon: Yvonne Kendall, MD;  Location: [facility];  Service: Cardiovascular;  Laterality: N/A;    Current Medications: No outpatient medications have been marked as taking for the 04/02/22 encounter (Appointment) with Sondra Barges, PA-C.    Allergies:   Shellfish allergy and Rosuvastatin   Social History   Socioeconomic History   Marital status: Married    Spouse name: Cala Bradford   Number of children: 5   Years of education: Not on file   Highest education level: Not on file  Occupational History   Not on file  Tobacco Use   Smoking status: Never   Smokeless tobacco: Never  Vaping Use   Vaping Use: Never used  Substance and Sexual Activity   Alcohol use: No   Drug use: No   Sexual activity: Yes    Partners: Female   Other Topics Concern   Not on file  Social History Narrative   Lives at home with spouse; 5 kids, 11 grandkids & 2 great grandkids   Social Determinants of Health   Financial Resource Strain: Not on file  Food Insecurity: No Food Insecurity (02/08/2022)   Hunger Vital Sign    Worried About Running Out of Food in the Last Year: Never true    Ran Out of Food in the Last Year: Never true  Transportation Needs: No Transportation Needs (02/08/2022)   PRAPARE - Administrator, Civil Service (Medical): No    Lack of Transportation (Non-Medical): No  Physical Activity: Not on file  Stress: Not on file  Social Connections: Not on file     Family History:  The patient's family history includes Diabetes in his brother; Heart disease in his father; Hyperlipidemia in his brother and mother; Hypertension in his father. There is no history of Heart failure.  ROS:   ROS   EKGs/Labs/Other Studies Reviewed:    Studies reviewed were summarized above. The additional studies were reviewed today: ***  EKG:  EKG is ordered today.  The EKG ordered today demonstrates ***  Recent Labs: 02/07/2022: ALT 15; TSH 0.835 02/08/2022: Magnesium 2.0 02/13/2022: Hemoglobin 16.6; Platelets 234 02/22/2022: B Natriuretic Peptide 168.7; BUN 29; Creatinine, Ser 1.97; Potassium 4.5; Sodium 137  Recent Lipid Panel    Component  Value Date/Time   CHOL 184 11/19/2014 0443   TRIG 188 (H) 11/19/2014 0443   HDL 40 (L) 11/19/2014 0443   CHOLHDL 4.6 11/19/2014 0443   VLDL 38 11/19/2014 0443   LDLCALC 106 (H) 11/19/2014 0443    PHYSICAL EXAM:    VS:  There were no vitals taken for this visit.  BMI: There is no height or weight on file to calculate BMI.  Physical Exam  Wt Readings from Last 3 Encounters:  03/17/22 215 lb (97.5 kg)  02/22/22 212 lb (96.2 kg)  02/12/22 207 lb 14.4 oz (94.3 kg)     ASSESSMENT & PLAN:   ***   {Are you ordering a CV Procedure (e.g. stress test, cath, DCCV, TEE,  etc)?   Press F2        :[id number]}     Disposition: F/u with Dr. Mariah Milling or an APP in ***, EP as directed.    Medication Adjustments/Labs and Tests Ordered: Current medicines are reviewed at length with the patient today.  Concerns regarding medicines are outlined above. Medication changes, Labs and Tests ordered today are summarized above and listed in the Patient Instructions accessible in Encounters.   Signed, Eula Listen, PA-C 03/22/2022 10:06 AM     [facility] [place] [address] [place], [place] [address] [phone]

## 2022-03-22 NOTE — Telephone Encounter (Signed)
I s/w the [facility] and confirmed they will be using local. It was stated to me that they do not put their pt's to sleep.

## 2022-03-22 NOTE — Telephone Encounter (Signed)
Please clarify anesthesia. Hopeful to clear when done with preop phone clinic if local.

## 2022-03-22 NOTE — Telephone Encounter (Signed)
   Patient Name: Jerry Howe  DOB: June 14, 1955 MRN: [id number]  Primary Cardiologist: Julien Nordmann, MD  Chart reviewed as part of pre-operative protocol coverage.   Simple dental extractions (i.e. 1-2 teeth) are considered low risk procedures per guidelines and generally do not require any specific cardiac clearance. It is also generally accepted that for simple extractions and dental cleanings, there is no need to interrupt blood thinner therapy.  SBE prophylaxis is not required for the patient from a cardiac standpoint.  I will route this recommendation to the requesting party via Epic fax function and remove from pre-op pool.  Please call with questions.  Laurann Montana, PA-C 03/22/2022, 3:50 PM

## 2022-03-22 NOTE — Telephone Encounter (Signed)
   Pre-operative Risk Assessment    Patient Name: Jerry Howe  DOB: [birth year] MRN: [id number]     Request for Surgical Clearance    Procedure:  Dental, 2 extractions  2. When is this surgery scheduled? Press F2 to enter date below and place date in Reason for Visit (see directions below). :1} Date of Surgery:  Clearance 03/23/22                                 Surgeon:  Lorayne Bender Surgeon's Group or Practice Name:  [facility] Phone number:  [phone] Fax number:  [phone]   Type of Clearance Requested:   Pharmacy, not listed   Type of Anesthesia:  Not Indicated   Additional requests/questions:    Signed, Narda Amber   03/22/2022, 2:29 PM

## 2022-03-25 ENCOUNTER — Other Ambulatory Visit (HOSPITAL_COMMUNITY): Payer: Self-pay

## 2022-03-26 ENCOUNTER — Ambulatory Visit: Payer: No Typology Code available for payment source | Attending: Medical | Admitting: Medical

## 2022-03-26 ENCOUNTER — Encounter: Payer: Self-pay | Admitting: Medical

## 2022-03-26 VITALS — BP 130/80 | HR 63 | Ht 69.0 in | Wt 214.5 lb

## 2022-03-26 DIAGNOSIS — I493 Ventricular premature depolarization: Secondary | ICD-10-CM | POA: Diagnosis not present

## 2022-03-26 DIAGNOSIS — I428 Other cardiomyopathies: Secondary | ICD-10-CM | POA: Diagnosis not present

## 2022-03-26 DIAGNOSIS — Z95810 Presence of automatic (implantable) cardiac defibrillator: Secondary | ICD-10-CM | POA: Diagnosis not present

## 2022-03-26 DIAGNOSIS — I480 Paroxysmal atrial fibrillation: Secondary | ICD-10-CM

## 2022-03-26 DIAGNOSIS — N183 Chronic kidney disease, stage 3 unspecified: Secondary | ICD-10-CM

## 2022-03-26 DIAGNOSIS — I5022 Chronic systolic (congestive) heart failure: Secondary | ICD-10-CM | POA: Diagnosis not present

## 2022-03-26 NOTE — Progress Notes (Signed)
Cardiology Office Note:    Date:  03/26/2022   ID:  Jerry Howe, DOB 04/25/1955, MRN [id number]  PCP:  Gladstone Lighter, MD  [facility] Cardiologist:  Ida Rogue, MD  [facility] Electrophysiologist:  Vickie Epley, MD   Referring MD: Gladstone Lighter, MD   Chief Complaint: Hospital follow-up  History of Present Illness:    Jerry Howe is a 66 y.o. male with a hx of systolic heart failure due to NICM, PAF, HTN who is being seen for follow-up.   The patient was diagnosed with heart failure in 2016.  Left heart cath at that time showed minimal CAD.  LVEF was 25%.  He was followed by [facility] clinic and later transferred to heart care.  He established with Dr. Rockey Situ in September 2023 and Dr. Quentin Ore in October 2023.  Patient was switched from lisinopril to [facility].  He is on spironolactone and metoprolol.  Echo from October 2023 showed LVEF 20 to 25%.  The patient was admitted in October 2023 after his defibrillator fired 6 times after getting in a heated discussion.  In the ED he was noted to be in A-fib, he converted on his own.  He was seen by Dr. Caryl Comes and ICD interrogation showed inappropriate ICD firing for A-fib.  Patient was started on amiodarone.  He was also found to have frequent PVCs.  Patient underwent cath procedure 02/10/2022 showing 10 to 20% proximal LAD disease, normal RCA and left circumflex.  It showed elevated filling pressures, severe pulmonary venous hypertension and moderately reduced CO in the setting of restrictive physiology.  Patient started following with the advanced heart failure clinic in November 2023.  Patient was on Entresto, spiral, Toprol, Jardiance.  Plan for CPX testing to rule stratify for advanced therapies.  May need eventual repeat right heart cath and milrinone.  Patient was in normal sinus rhythm.  Patient was seen by Dr. Quentin Ore 03/17/2022 on amiodarone and Eliquis.  They discussed ablation, patient wanted to proceed and was  subsequently scheduled.  Today, the patient reports questions about weight. He reports a weight loss from 240lbs to almost 201lbs by his scales. He weighs himself every day and reports a weight of 201-205lbs at home. This morning it was 205lbs. According to our scales weight in September was 225lbs. Weight in April 2023 was 233lbs. Weight today is 214lbs. It appears he has lost about 10lbs. He has made diet changes, low salt diet and only dirnks 64oz a liquid. He only eats one meal a day.  He has not been working out and has lost muscle mass. He denies swelling on his feet, orthopnea or pnd. He takes torsemide as needed. BP low at times, has occasional orthostasis.   Past Medical History:  Diagnosis Date   Anxiety    Arrhythmia    CHF (congestive heart failure) ([place])    Hypertension    Kidney stones    Shortness of breath dyspnea     Past Surgical History:  Procedure Laterality Date   CARDIAC CATHETERIZATION Right 11/20/2014   Procedure: Left Heart Cath and Coronary Angiography;  Surgeon: Isaias Cowman, MD;  Location: [place] CV [facility];  Service: Cardiovascular;  Laterality: Right;   IMPLANTABLE CARDIOVERTER DEFIBRILLATOR (ICD) GENERATOR CHANGE Left 08/15/2015   Procedure: ICD IMPLANT, single chamber;  Surgeon: Marzetta Board, MD;  Location: [facility] ORS;  Service: Cardiovascular;  Laterality: Left;   KNEE SURGERY     RIGHT/LEFT HEART CATH AND CORONARY ANGIOGRAPHY N/A 02/10/2022   Procedure:  RIGHT/LEFT HEART CATH AND CORONARY ANGIOGRAPHY;  Surgeon: Yvonne Kendall, MD;  Location: [facility] INVASIVE CV LAB;  Service: Cardiovascular;  Laterality: N/A;    Current Medications: Current Meds  Medication Sig   albuterol (PROVENTIL HFA;VENTOLIN HFA) 108 (90 Base) MCG/ACT inhaler Inhale 1 puff into the lungs every 6 (six) hours as needed for wheezing or shortness of breath.   amiodarone (PACERONE) 200 MG tablet Take 200 mg by mouth daily.   apixaban (ELIQUIS) 5 MG TABS tablet Take 1 tablet (5 mg  total) by mouth 2 (two) times daily.   Ascorbic Acid (VITAMIN C) 1000 MG tablet Take 1,000 mg by mouth daily.   cyanocobalamin 2000 MCG tablet Take 2,000 mcg by mouth daily.   empagliflozin (JARDIANCE) 10 MG TABS tablet Take 1 tablet (10 mg total) by mouth daily before breakfast.   Magnesium 200 MG TABS Take 200 mg by mouth daily.   metoprolol succinate (TOPROL-XL) 25 MG 24 hr tablet Take 25 mg by mouth daily.   Multiple Vitamins-Minerals (SUPER THERA VITE M PO) Take 1 tablet by mouth daily.   omeprazole (PRILOSEC) 20 MG capsule Take by mouth.   pyridoxine (B-6) 500 MG tablet Take 500 mg by mouth daily.   rosuvastatin (CRESTOR) 10 MG tablet Take 1 tablet (10 mg total) by mouth at bedtime.   sacubitril-valsartan (ENTRESTO) 24-26 MG Take 1 tablet (24-26 mg) by mouth twice daily   spironolactone (ALDACTONE) 25 MG tablet Take 1 tablet by mouth daily.   torsemide (DEMADEX) 20 MG tablet Take 1 tablet (20 mg total) by mouth daily as needed.     Allergies:   Shellfish allergy and Rosuvastatin   Social History   Socioeconomic History   Marital status: Married    Spouse name: Jerry Howe   Number of children: 5   Years of education: Not on file   Highest education level: Not on file  Occupational History   Not on file  Tobacco Use   Smoking status: Never   Smokeless tobacco: Never  Vaping Use   Vaping Use: Never used  Substance and Sexual Activity   Alcohol use: No   Drug use: No   Sexual activity: Yes    Partners: Female  Other Topics Concern   Not on file  Social History Narrative   Lives at home with spouse; 5 kids, 11 grandkids & 2 great grandkids   Social Determinants of Health   Financial Resource Strain: Not on file  Food Insecurity: No Food Insecurity (02/08/2022)   Hunger Vital Sign    Worried About Running Out of Food in the Last Year: Never true    Ran Out of Food in the Last Year: Never true  Transportation Needs: No Transportation Needs (02/08/2022)   PRAPARE -  Administrator, Civil Service (Medical): No    Lack of Transportation (Non-Medical): No  Physical Activity: Not on file  Stress: Not on file  Social Connections: Not on file     Family History: The patient's family history includes Diabetes in his brother; Heart disease in his father; Hyperlipidemia in his brother and mother; Hypertension in his father. There is no history of Heart failure.  ROS:   Please see the history of present illness.     All other systems reviewed and are negative.  EKGs/Labs/Other Studies Reviewed:    The following studies were reviewed today:  Cardiac cath 01/2022 Conclusions: Mild, nonobstructive coronary artery disease with 10-20% stenosis involving the proximal LAD.  No significant disease involving  ramus intermedius, LCx, or RCA. Moderately-severely elevated left heart filling pressures (PCWP 30 mmHg, LVEDP 40 mmHg).  Prominent V waves on PCWP tracing suggest moderate-severe mitral regurgitation. Severe pulmonary hypertension (mean PAP 50 mmHg, PVR 4.3 WU). Moderately elevated right heart filling pressure (mean RA 14 mmHg, RV EDP 13 mmHg). Mildly reduced Fick cardiac output/index (CO 4.7 L/min, CI 2.2 L/min/m^2).   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 Escalate goal-directed medical therapy as blood pressure and renal function allow.  Consultation with advanced heart failure team recommended (can be done on an inpatient or outpatient basis based on clinical course). Medical therapy and risk factor modification to prevent progression of mild CAD. Restart IV heparin 2 hours after Zephyr band has been removed.  Start apixaban 5 mg twice daily tomorrow morning if no evidence of bleeding/vascular injury.   Nelva Bush, MD [facility]  Echo 02/09/22  1. Left ventricular ejection fraction, by estimation, is 20 to 25%. The  left ventricle has severely decreased  function. The left ventricle  demonstrates global hypokinesis. The left ventricular internal cavity size  was severely dilated. There is mild  left ventricular hypertrophy. Left ventricular diastolic parameters are  consistent with Grade III diastolic dysfunction (restrictive). Elevated  left atrial pressure.   2. Right ventricular systolic function is normal. The right ventricular  size is normal. There is normal pulmonary artery systolic pressure.   3. Left atrial size was moderately dilated.   4. Right atrial size was mildly dilated.   5. A small pericardial effusion is present. The pericardial effusion is  circumferential.   6. The mitral valve is normal in structure. Mild to moderate mitral valve  regurgitation.   7. The aortic valve is tricuspid. There is mild thickening of the aortic  valve. Aortic valve regurgitation is mild. Aortic valve sclerosis is  present, with no evidence of aortic valve stenosis.   8. The inferior vena cava is normal in size with <50% respiratory  variability, suggesting right atrial pressure of 8 mmHg.   EKG:  EKG is ordered today.  The ekg ordered today demonstrates NSR 64bpm, 1st degree AV block, LBBB, nonspecific T wave changes  Recent Labs: 02/07/2022: ALT 15; TSH 0.835 02/08/2022: Magnesium 2.0 02/13/2022: Hemoglobin 16.6; Platelets 234 02/22/2022: B Natriuretic Peptide 168.7; BUN 29; Creatinine, Ser 1.97; Potassium 4.5; Sodium 137  Recent Lipid Panel    Component Value Date/Time   CHOL 184 11/19/2014 0443   TRIG 188 (H) 11/19/2014 0443   HDL 40 (L) 11/19/2014 0443   CHOLHDL 4.6 11/19/2014 0443   VLDL 38 11/19/2014 0443   LDLCALC 106 (H) 11/19/2014 0443    Physical Exam:    VS:  BP 130/80 (BP Location: Left Arm, Patient Position: Sitting, Cuff Size: Normal)   Pulse 63   Ht 5\' 9"  (1.753 m)   Wt 214 lb 8 oz (97.3 kg)   SpO2 98%   BMI 31.68 kg/m     Wt Readings from Last 3 Encounters:  03/26/22 214 lb 8 oz (97.3 kg)  03/17/22 215 lb  (97.5 kg)  02/22/22 212 lb (96.2 kg)     GEN:  Well nourished, well developed in no acute distress HEENT: Normal NECK: No JVD; No carotid bruits LYMPHATICS: No lymphadenopathy CARDIAC: RRR, no murmurs, rubs, gallops RESPIRATORY:  Clear to auscultation without rales, wheezing or rhonchi  ABDOMEN: Soft, non-tender, non-distended MUSCULOSKELETAL:  No edema; No deformity  SKIN: Warm and dry NEUROLOGIC:  Alert  and oriented x 3 PSYCHIATRIC:  Normal affect   ASSESSMENT:    1. Chronic systolic heart failure ([place])   2. NICM (nonischemic cardiomyopathy) ([place])   3. PAF (paroxysmal atrial fibrillation) ([place])   4. PVC's (premature ventricular contractions)   5. Stage 3 chronic kidney disease, unspecified whether stage 3a or 3b CKD ([place])   6. ICD (implantable cardioverter-defibrillator) in place    PLAN:    In order of problems listed above:  Chronic systolic HF S/p ICD - due to NICM, unclear etiology - no prior cMRI - Echo 2016 EF 25%, cath with minimal CAD - Echo 06/2018 showed LVEF 25-30% - Echo 02/09/22 LVEF 20-25%, G3DD - s/p ICD - cath 02/10/22 showed minimal CAD with markedly elevated filling pressures, severe pulmonary venous HTN and moderately reduced CO - volume status is good, he takes torsemide as needed for swelling - question about weights, it appears he has lost about 10lbs since hospitalization due to IV diuresis, diet changes, muscle loss, (not weight lifting). He is still trying to establish baseline weight still - Continue Entresto 24/26mg  BID - continue spiro 25mg  daily - continue Toprol 25mg  daily - continue Jardiance 10mg  daily - caution with increase in Entresto given reported intermittent low Bps and orthostasis - CPX showed moderate functional impairment, mild to mod HF limitations with reduced vO2, O2 pulse and evidence of ventilation-perfusion mismatch. This will be reviewed with Dr. Haroldine Laws at upcoming appointment. This was briefly discussed  Pafib - New  diagnosis since hospitalization 01/2022 led to inappropriate shocks x 6, he was started on amiodarone and Eliquis - saw EP and plan for afib ablation in February 2024 - He is in NSR on EKG today - plan to stop the amiodarone in the future - continue Eliquis 5mg  BID - continue amiodarone 200mg  daily for now  Frequent PVCs - continue amiodarone for now - continue Toprol 25mg  daily - EKG with no PVCS  CKD stage 3 - Most recent labs showed sCr 1.97, BUN 29 GFR 37 - continue to follow  Disposition: Follow up in 3 month(s) with MD/APP general cardiology   Signed, Halley Shepheard Ninfa Meeker, PA-C  03/26/2022 12:49 PM    [place]

## 2022-03-26 NOTE — Patient Instructions (Addendum)
Medication Instructions:  - Your physician recommends that you continue on your current medications as directed. Please refer to the Current Medication list given to you today.  *If you need a refill on your cardiac medications before your next appointment, please call your pharmacy*   Lab Work: - none ordered  If you have labs (blood work) drawn today and your tests are completely normal, you will receive your results only by: MyChart Message (if you have MyChart) OR A paper copy in the mail If you have any lab test that is abnormal or we need to change your treatment, we will call you to review the results.   Testing/Procedures: N/A   Follow-Up: At [facility], you and your health needs are our priority.  As part of our continuing mission to provide you with exceptional heart care, we have created designated Provider Care Teams.  These Care Teams include your primary Cardiologist (physician) and Advanced Practice Providers (APPs -  Physician Assistants and Nurse Practitioners) who all work together to provide you with the care you need, when you need it.  We recommend signing up for the patient portal called "MyChart".  Sign up information is provided on this After Visit Summary.  MyChart is used to connect with patients for Virtual Visits (Telemedicine).  Patients are able to view lab/test results, encounter notes, upcoming appointments, etc.  Non-urgent messages can be sent to your provider as well.   To learn more about what you can do with MyChart, go to [email or web address].    Your next appointment:   3 month(s)  The format for your next appointment:   In Person  Provider:   You may see Julien Nordmann, MD or one of the following Advanced Practice Providers on your designated Care Team:   Nicolasa Ducking, NP Eula Listen, PA-C Cadence Fransico Michael, PA-C Charlsie Quest, NP    Other Instructions   Important Information About Sugar

## 2022-03-29 ENCOUNTER — Ambulatory Visit (HOSPITAL_BASED_OUTPATIENT_CLINIC_OR_DEPARTMENT_OTHER): Payer: No Typology Code available for payment source | Admitting: Internal Medicine

## 2022-03-29 ENCOUNTER — Other Ambulatory Visit
Admission: RE | Admit: 2022-03-29 | Discharge: 2022-03-29 | Disposition: A | Discharge status: Home / Self Care | Payer: No Typology Code available for payment source | Source: Ambulatory Visit | Attending: Internal Medicine | Admitting: Internal Medicine

## 2022-03-29 ENCOUNTER — Encounter: Payer: Self-pay | Admitting: Internal Medicine

## 2022-03-29 VITALS — BP 122/72 | HR 68 | Resp 20 | Wt 217.0 lb

## 2022-03-29 DIAGNOSIS — Z95810 Presence of automatic (implantable) cardiac defibrillator: Secondary | ICD-10-CM | POA: Insufficient documentation

## 2022-03-29 DIAGNOSIS — I130 Hypertensive heart and chronic kidney disease with heart failure and stage 1 through stage 4 chronic kidney disease, or unspecified chronic kidney disease: Secondary | ICD-10-CM | POA: Insufficient documentation

## 2022-03-29 DIAGNOSIS — Z79899 Other long term (current) drug therapy: Secondary | ICD-10-CM | POA: Insufficient documentation

## 2022-03-29 DIAGNOSIS — I5022 Chronic systolic (congestive) heart failure: Secondary | ICD-10-CM

## 2022-03-29 DIAGNOSIS — I2510 Atherosclerotic heart disease of native coronary artery without angina pectoris: Secondary | ICD-10-CM | POA: Insufficient documentation

## 2022-03-29 DIAGNOSIS — Z7901 Long term (current) use of anticoagulants: Secondary | ICD-10-CM | POA: Insufficient documentation

## 2022-03-29 DIAGNOSIS — I493 Ventricular premature depolarization: Secondary | ICD-10-CM | POA: Insufficient documentation

## 2022-03-29 DIAGNOSIS — N1831 Chronic kidney disease, stage 3a: Secondary | ICD-10-CM | POA: Insufficient documentation

## 2022-03-29 DIAGNOSIS — R0609 Other forms of dyspnea: Secondary | ICD-10-CM | POA: Insufficient documentation

## 2022-03-29 DIAGNOSIS — Z7984 Long term (current) use of oral hypoglycemic drugs: Secondary | ICD-10-CM | POA: Insufficient documentation

## 2022-03-29 DIAGNOSIS — I480 Paroxysmal atrial fibrillation: Secondary | ICD-10-CM | POA: Insufficient documentation

## 2022-03-29 LAB — BASIC METABOLIC PANEL
Anion gap: 9 (ref 5–15)
BUN: 22 mg/dL (ref 8–23)
CO2: 22 mmol/L (ref 22–32)
Calcium: 9.2 mg/dL (ref 8.9–10.3)
Chloride: 106 mmol/L (ref 98–111)
Creatinine, Ser: 1.53 mg/dL — ABNORMAL HIGH (ref 0.61–1.24)
GFR, Estimated: 50 mL/min — ABNORMAL LOW (ref >60)
Glucose, Bld: 110 mg/dL — ABNORMAL HIGH (ref 70–99)
Potassium: 4.1 mmol/L (ref 3.5–5.1)
Sodium: 137 mmol/L (ref 135–145)

## 2022-03-29 LAB — BRAIN NATRIURETIC PEPTIDE: B Natriuretic Peptide: 332.7 pg/mL — ABNORMAL HIGH (ref 0.0–100.0)

## 2022-03-29 NOTE — Patient Instructions (Signed)
Medication Changes:  Continue taking all of your medications as prescribed  Lab Work:  Labs done today, your results will be available in MyChart, we will contact you for abnormal readings.   Testing/Procedures:  No tests ordered today  Referrals:  No new referrals today  Special Instructions // Education:  Do the following things EVERYDAY: Weigh yourself in the morning before breakfast. Write it down and keep it in a log. Take your medicines as prescribed Eat low salt foods--Limit salt (sodium) to 2000 mg per day.  Stay as active as you can everyday Limit all fluids for the day to less than 2 liters   Follow-Up in:  Follow up with Dr. Gala Romney in 2 months     If you have any questions or concerns before your next appointment please send [place] a message through [facility] or call our office at [phone] Monday-Friday 8 am-5 pm.   If you have an urgent need after hours on the weekend please call your Primary Cardiologist or the Advanced Heart Failure Clinic in [place] at [phone].

## 2022-03-29 NOTE — Progress Notes (Signed)
ADVANCED HF CLINIC NOTE   PCP: Enid Baas, MD Primary Cardiologist: Julien Nordmann, MD   HPI  Jerry Howe is a 66 y/o male with systolic HF due to NICM, PAF, HTN he is being seen today for evaluation of heart failure at the request of Dr. Delfino Lovett   Mr. Gosse was diagnosed with heart failure in 2016.  LHC at that time revealed minimal CAD.  LVEF was 25%.  He was followed by [facility] clinic and recently transferred to [facility] heart care.  He established with Dr. Mariah Milling on 12/2021 and Dr. Lalla Brothers on 01/2022.  He reported NYHA Class II symptoms and lisinopril switch to Entresto.  He remained on spironolactone and metoprolol.  He established with Dr. Lalla Brothers on 01/2022 he was stable and doing well.  He has no history of atrial fibrillation.   Echo 3/20 EF 25-30% LVIDD 7.6 Echo 02/09/22  EF 20-25% RV ok  LVIDD 7.5   At baseline NYHA III. Says he stopped lasix earlier this year and got worse but restarted 2 months ago and was a bit better.   He presented to the ED on 10/22 after his defibrillator fired 6 times at [employer] after he got into a heated discussion.  In the ED he was noted to be in atrial fibrillation.  He converted on his own. Seen by Dr. Graciela Husbands and ICD interogation showed inappropriate ICD firing for AF. Started on amio. Also found to have frequent PVCs.    Cath 02/10/22  LAD 10-20%prox. RCA and LCX ok  RA 14 RV 90/13 PA 90/30 (50) PCWP 30 with v-waves 45 Ao sat 96% PA sat 67% Fick 4.7/2.2 PVR 4.3 WU    CPX 03/22/22  Resting HR: 67 Standing HR: 67 Peak HR: 123   (80% age predicted max HR)  BP rest: 118/82 BP standing: 118/78 BP peak: 194/82  Peak VO2: 13.9 (47.9% predicted peak VO2)  VE/VCO2 slope:  36.9  OUES: 1.58  Peak RER: 0.96  VE/MVV:  43.4%  O2pulse:  90mL/beat   (69% predicted O2pulse)    Returns today for f/u with his wife.  He works as a Education officer, environmental. Says he feel a whole lot better. Can do all ADLs without problem. Mild DOE. No CP, orthopnea, edema or PND.  Says he stopped CPX due to dry throat.    ROS: All systems negative except as listed in HPI, PMH and Problem List.  SH:  Social History   Socioeconomic History   Marital status: Married    Spouse name: Cala Bradford   Number of children: 5   Years of education: Not on file   Highest education level: Not on file  Occupational History   Not on file  Tobacco Use   Smoking status: Never   Smokeless tobacco: Never  Vaping Use   Vaping Use: Never used  Substance and Sexual Activity   Alcohol use: No   Drug use: No   Sexual activity: Yes    Partners: Female  Other Topics Concern   Not on file  Social History Narrative   Lives at home with spouse; 5 kids, 11 grandkids & 2 great grandkids   Social Determinants of Health   Financial Resource Strain: Not on file  Food Insecurity: No Food Insecurity (02/08/2022)   Hunger Vital Sign    Worried About Running Out of Food in the Last Year: Never true    Ran Out of Food in the Last Year: Never true  Transportation Needs: No Transportation Needs (  02/08/2022)   PRAPARE - Administrator, Civil Service (Medical): No    Lack of Transportation (Non-Medical): No  Physical Activity: Not on file  Stress: Not on file  Social Connections: Not on file  Intimate Partner Violence: Not At Risk (02/08/2022)   Humiliation, Afraid, Rape, and Kick questionnaire    Fear of Current or Ex-Partner: No    Emotionally Abused: No    Physically Abused: No    Sexually Abused: No    FH:  Family History  Problem Relation Age of Onset   Hyperlipidemia Mother    Hypertension Father    Heart disease Father    Hyperlipidemia Brother    Diabetes Brother    Heart failure Neg Hx     Past Medical History:  Diagnosis Date   Anxiety    Arrhythmia    CHF (congestive heart failure) (HCC)    Hypertension    Kidney stones    Shortness of breath dyspnea     Current Outpatient Medications  Medication Sig Dispense Refill   albuterol (PROVENTIL  HFA;VENTOLIN HFA) 108 (90 Base) MCG/ACT inhaler Inhale 1 puff into the lungs every 6 (six) hours as needed for wheezing or shortness of breath.     amiodarone (PACERONE) 200 MG tablet Take 2 tablets (400 mg total) by mouth 2 (two) times daily. amiodarone 400 twice daily 1 week then down to 200 twice daily 1 week then down to 200 daily 120 tablet 1   apixaban (ELIQUIS) 5 MG TABS tablet Take 1 tablet (5 mg total) by mouth 2 (two) times daily. 60 tablet 0   Ascorbic Acid (VITAMIN C) 1000 MG tablet Take 1,000 mg by mouth daily.     cyanocobalamin 2000 MCG tablet Take 2,000 mcg by mouth daily.     Magnesium 200 MG TABS Take 200 mg by mouth daily.     metoprolol succinate (TOPROL-XL) 25 MG 24 hr tablet Take 25 mg by mouth daily.     Multiple Vitamins-Minerals (SUPER THERA VITE M PO) Take 1 tablet by mouth daily.     omeprazole (PRILOSEC) 20 MG capsule Take by mouth.     pyridoxine (B-6) 500 MG tablet Take 500 mg by mouth daily.     rosuvastatin (CRESTOR) 10 MG tablet Take 1 tablet (10 mg total) by mouth at bedtime. 30 tablet 0   sacubitril-valsartan (ENTRESTO) 24-26 MG Take 1 tablet (24-26 mg) by mouth twice daily 60 tablet 6   spironolactone (ALDACTONE) 25 MG tablet Take 1 tablet by mouth daily.     torsemide (DEMADEX) 20 MG tablet Take 1 tablet (20 mg total) by mouth daily. 30 tablet 0   No current facility-administered medications for this visit.    Vitals:   02/22/22 1327  BP: 108/64  Pulse: (!) 53  SpO2: 99%  Weight: 212 lb (96.2 kg)    PHYSICAL EXAM:  General:  Well appearing. No resp difficulty HEENT: normal Neck: supple. no JVD. Carotids 2+ bilat; no bruits. No lymphadenopathy or thryomegaly appreciated. Cor: PMI nondisplaced. Regular rate & rhythm. No rubs, gallops or murmurs. Lungs: clear Abdomen: soft, nontender, nondistended. No hepatosplenomegaly. No bruits or masses. Good bowel sounds. Extremities: no cyanosis, clubbing, rash, edema Neuro: alert & orientedx3, cranial nerves  grossly intact. moves all 4 extremities w/o difficulty. Affect pleasant  ASSESSMENT & PLAN:  1. Chronic systolic HF - due to NICM. Unclear etiology. Has not had cMRI - Echo 2016 EF 25% - Echo 3/20 EF 25-30% LVIDD 7.6 - Echo  02/09/22  EF 20-25% RV ok  LVIDD 7.5 G3DD - s/p MDT ICD - Cath 02/10/22 with minimal CAD (LAD 10-20%) markedly elevated filling pressures, severe pulmonary venous HTN and moderately reduced CO in setting of restrictive physiology - CPX 03/22/22 pRER 0.96  pVO2: 13.9 (47.9%) VE/VCO2 slope:  36.9 OUES: 1.58  VE/MVV:  43.4%  - Stable NYHA II - Volume status ok on torsemide 20 daily - Continue Entresto 24/26 bid - Continue Spiro 25 - Continue Toprol 25 - Continue Jardiance 10 - ICD interrogation: No VT/AF. Volume minimally elevated but below threshold Activity level 3.8 hr/day - CPX testing is submax test with moderate HF limitation. Need to follow. Not ready for advanced therapies at this time.   2. PAF - now s/p ICD inappropriate shock x 6 in 10/23 - on amio 200 daily - in NSR today - continue Eliquis. - EP following -> plan for AF ablation in 2/23 - ICD interrogation as above   3. Frequent PVCs  - suppressed on amio    4. CKD 3a-3b  - baseline SCr 1.3-1.8 - Continue Jardiance - Repeat labs today  Arvilla Meres, MD  1:58 PM

## 2022-04-01 ENCOUNTER — Encounter: Payer: Self-pay | Admitting: Internal Medicine

## 2022-04-02 ENCOUNTER — Ambulatory Visit: Payer: No Typology Code available for payment source | Admitting: Physician Assistant

## 2022-04-06 ENCOUNTER — Ambulatory Visit: Payer: No Typology Code available for payment source | Admitting: Internal Medicine

## 2022-05-14 ENCOUNTER — Telehealth (HOSPITAL_COMMUNITY): Payer: Self-pay | Admitting: *Deleted

## 2022-05-14 NOTE — Telephone Encounter (Signed)
Reaching out to patient to offer assistance regarding upcoming cardiac imaging study; pt verbalizes understanding of appt date/time, parking situation and where to check in, pre-test NPO status and verified current allergies; name and call back number provided for further questions should they arise  Jerry Clement RN Navigator Cardiac Imaging [facility] [phone] office [phone] cell  Patient to take his daily medications two hours prior to his cardiac CT scan.

## 2022-05-17 ENCOUNTER — Ambulatory Visit (HOSPITAL_BASED_OUTPATIENT_CLINIC_OR_DEPARTMENT_OTHER)
Admission: RE | Admit: 2022-05-17 | Discharge: 2022-05-17 | Disposition: A | Discharge status: Home / Self Care | Payer: Medicare HMO | Source: Ambulatory Visit | Attending: Cardiology | Admitting: Cardiology

## 2022-05-17 DIAGNOSIS — I480 Paroxysmal atrial fibrillation: Secondary | ICD-10-CM

## 2022-05-17 DIAGNOSIS — I5022 Chronic systolic (congestive) heart failure: Secondary | ICD-10-CM

## 2022-05-17 LAB — POCT I-STAT CREATININE: Creatinine, Ser: 1.8 mg/dL — ABNORMAL HIGH (ref 0.61–1.24)

## 2022-05-17 MED ORDER — IOHEXOL 350 MG/ML SOLN
100.0000 mL | Freq: Once | INTRAVENOUS | Status: AC | PRN
  Administered 2022-05-17: 80 mL via INTRAVENOUS

## 2022-05-18 ENCOUNTER — Other Ambulatory Visit: Payer: Self-pay | Admitting: Internal Medicine

## 2022-05-19 ENCOUNTER — Other Ambulatory Visit: Payer: Self-pay | Admitting: Family

## 2022-05-19 ENCOUNTER — Other Ambulatory Visit (HOSPITAL_COMMUNITY): Payer: Self-pay

## 2022-05-19 MED ORDER — TORSEMIDE 20 MG PO TABS
20.0000 mg | ORAL_TABLET | Freq: Every day | ORAL | 1 refills | Status: DC | PRN
  Filled 2022-05-19: qty 30, 30d supply, fill #0

## 2022-05-19 MED ORDER — TORSEMIDE 20 MG PO TABS: 20.0000 mg | ORAL_TABLET | Freq: Every day | ORAL | 3 refills | Status: DC | PRN

## 2022-05-21 NOTE — Pre-Procedure Instructions (Signed)
Instructed patient on the following items: Arrival time 0515 Nothing to eat or drink after midnight No meds AM of procedure Responsible person to drive you home and stay with you for 24 hrs  Have you missed any doses of anti-coagulant Eliquis- hasn't missed any does, don't miss any of the weekend, don't take Monday AM dose

## 2022-05-24 ENCOUNTER — Other Ambulatory Visit: Payer: Self-pay

## 2022-05-24 ENCOUNTER — Ambulatory Visit (HOSPITAL_COMMUNITY): Payer: Medicare HMO | Admitting: Certified Registered"

## 2022-05-24 ENCOUNTER — Encounter (HOSPITAL_COMMUNITY): Payer: Self-pay | Admitting: Cardiology

## 2022-05-24 ENCOUNTER — Ambulatory Visit (HOSPITAL_BASED_OUTPATIENT_CLINIC_OR_DEPARTMENT_OTHER): Payer: Medicare HMO | Admitting: Certified Registered"

## 2022-05-24 ENCOUNTER — Ambulatory Visit (HOSPITAL_COMMUNITY)
Admission: RE | Disposition: A | Discharge status: Home / Self Care | Payer: Self-pay | Source: Home / Self Care | Attending: Cardiology

## 2022-05-24 ENCOUNTER — Ambulatory Visit (HOSPITAL_BASED_OUTPATIENT_CLINIC_OR_DEPARTMENT_OTHER)
Admission: RE | Admit: 2022-05-24 | Discharge: 2022-05-24 | Disposition: A | Discharge status: Home / Self Care | Payer: Medicare HMO | Source: Home / Self Care | Attending: Cardiology | Admitting: Cardiology

## 2022-05-24 ENCOUNTER — Ambulatory Visit (HOSPITAL_COMMUNITY)
Admission: RE | Admit: 2022-05-24 | Discharge: 2022-05-24 | Disposition: A | Discharge status: Home / Self Care | Payer: Medicare HMO | Attending: Cardiology | Admitting: Cardiology

## 2022-05-24 ENCOUNTER — Other Ambulatory Visit (HOSPITAL_COMMUNITY): Payer: Self-pay

## 2022-05-24 DIAGNOSIS — I080 Rheumatic disorders of both mitral and aortic valves: Secondary | ICD-10-CM | POA: Insufficient documentation

## 2022-05-24 DIAGNOSIS — F419 Anxiety disorder, unspecified: Secondary | ICD-10-CM | POA: Diagnosis not present

## 2022-05-24 DIAGNOSIS — I340 Nonrheumatic mitral (valve) insufficiency: Secondary | ICD-10-CM

## 2022-05-24 DIAGNOSIS — I4891 Unspecified atrial fibrillation: Secondary | ICD-10-CM

## 2022-05-24 DIAGNOSIS — I509 Heart failure, unspecified: Secondary | ICD-10-CM

## 2022-05-24 DIAGNOSIS — I4819 Other persistent atrial fibrillation: Secondary | ICD-10-CM | POA: Diagnosis not present

## 2022-05-24 DIAGNOSIS — Z95810 Presence of automatic (implantable) cardiac defibrillator: Secondary | ICD-10-CM | POA: Diagnosis not present

## 2022-05-24 DIAGNOSIS — Z4502 Encounter for adjustment and management of automatic implantable cardiac defibrillator: Secondary | ICD-10-CM

## 2022-05-24 DIAGNOSIS — I480 Paroxysmal atrial fibrillation: Secondary | ICD-10-CM | POA: Diagnosis not present

## 2022-05-24 DIAGNOSIS — Z7901 Long term (current) use of anticoagulants: Secondary | ICD-10-CM | POA: Diagnosis not present

## 2022-05-24 DIAGNOSIS — I110 Hypertensive heart disease with heart failure: Secondary | ICD-10-CM | POA: Diagnosis not present

## 2022-05-24 DIAGNOSIS — Z79899 Other long term (current) drug therapy: Secondary | ICD-10-CM | POA: Diagnosis not present

## 2022-05-24 DIAGNOSIS — I5022 Chronic systolic (congestive) heart failure: Secondary | ICD-10-CM | POA: Diagnosis not present

## 2022-05-24 HISTORY — PX: ATRIAL FIBRILLATION ABLATION: EP1191

## 2022-05-24 HISTORY — PX: TEE WITHOUT CARDIOVERSION: SHX5443

## 2022-05-24 LAB — ECHO TEE
MV M vel: 4.06 m/s
MV Peak grad: 65.9 mmHg
Radius: 0.5 cm

## 2022-05-24 LAB — BASIC METABOLIC PANEL
Anion gap: 11 (ref 5–15)
BUN: 17 mg/dL (ref 8–23)
CO2: 25 mmol/L (ref 22–32)
Calcium: 9.2 mg/dL (ref 8.9–10.3)
Chloride: 99 mmol/L (ref 98–111)
Creatinine, Ser: 1.57 mg/dL — ABNORMAL HIGH (ref 0.61–1.24)
GFR, Estimated: 48 mL/min — ABNORMAL LOW (ref >60)
Glucose, Bld: 123 mg/dL — ABNORMAL HIGH (ref 70–99)
Potassium: 3.9 mmol/L (ref 3.5–5.1)
Sodium: 135 mmol/L (ref 135–145)

## 2022-05-24 LAB — CBC
HCT: 41.2 % (ref 39.0–52.0)
Hemoglobin: 13.7 g/dL (ref 13.0–17.0)
MCH: 28.8 pg (ref 26.0–34.0)
MCHC: 33.3 g/dL (ref 30.0–36.0)
MCV: 86.6 fL (ref 80.0–100.0)
Platelets: 165 10*3/uL (ref 150–400)
RBC: 4.76 MIL/uL (ref 4.22–5.81)
RDW: 14.7 % (ref 11.5–15.5)
WBC: 8.8 10*3/uL (ref 4.0–10.5)
nRBC: 0 % (ref 0.0–0.2)

## 2022-05-24 LAB — POCT ACTIVATED CLOTTING TIME
Activated Clotting Time: 261 seconds
Activated Clotting Time: 314 seconds

## 2022-05-24 SURGERY — ATRIAL FIBRILLATION ABLATION
Anesthesia: General

## 2022-05-24 MED ORDER — HEPARIN SODIUM (PORCINE) 1000 UNIT/ML IJ SOLN
INTRAMUSCULAR | Status: DC | PRN
  Administered 2022-05-24: 6000 [IU] via INTRAVENOUS
  Administered 2022-05-24: 3000 [IU] via INTRAVENOUS
  Administered 2022-05-24: 15000 [IU] via INTRAVENOUS

## 2022-05-24 MED ORDER — HEPARIN (PORCINE) IN NACL 1000-0.9 UT/500ML-% IV SOLN
INTRAVENOUS | Status: DC | PRN
  Administered 2022-05-24 (×2): 500 mL

## 2022-05-24 MED ORDER — HEPARIN SODIUM (PORCINE) 1000 UNIT/ML IJ SOLN
INTRAMUSCULAR | Status: DC | PRN
  Administered 2022-05-24: 1000 [IU] via INTRAVENOUS

## 2022-05-24 MED ORDER — PROPOFOL 10 MG/ML IV BOLUS
INTRAVENOUS | Status: DC | PRN
  Administered 2022-05-24: 120 mg via INTRAVENOUS

## 2022-05-24 MED ORDER — SODIUM CHLORIDE 0.9% FLUSH: 3.0000 mL | Freq: Two times a day (BID) | INTRAVENOUS | Status: DC

## 2022-05-24 MED ORDER — EPHEDRINE SULFATE-NACL 50-0.9 MG/10ML-% IV SOSY
PREFILLED_SYRINGE | INTRAVENOUS | Status: DC | PRN
  Administered 2022-05-24: 10 mg via INTRAVENOUS

## 2022-05-24 MED ORDER — COLCHICINE 0.6 MG PO TABS
0.6000 mg | ORAL_TABLET | Freq: Two times a day (BID) | ORAL | Status: DC
  Administered 2022-05-24: 0.6 mg via ORAL
  Filled 2022-05-24: qty 1

## 2022-05-24 MED ORDER — HEPARIN SODIUM (PORCINE) 1000 UNIT/ML IJ SOLN
INTRAMUSCULAR | Status: AC
  Filled 2022-05-24: qty 10

## 2022-05-24 MED ORDER — PROTAMINE SULFATE 10 MG/ML IV SOLN
INTRAVENOUS | Status: DC | PRN
  Administered 2022-05-24: 35 mg via INTRAVENOUS

## 2022-05-24 MED ORDER — DEXAMETHASONE SODIUM PHOSPHATE 10 MG/ML IJ SOLN
INTRAMUSCULAR | Status: DC | PRN
  Administered 2022-05-24: 10 mg via INTRAVENOUS

## 2022-05-24 MED ORDER — PHENYLEPHRINE HCL-NACL 20-0.9 MG/250ML-% IV SOLN
INTRAVENOUS | Status: DC | PRN
  Administered 2022-05-24: 50 ug/min via INTRAVENOUS

## 2022-05-24 MED ORDER — FENTANYL CITRATE (PF) 250 MCG/5ML IJ SOLN
INTRAMUSCULAR | Status: DC | PRN
  Administered 2022-05-24 (×2): 50 ug via INTRAVENOUS

## 2022-05-24 MED ORDER — COLCHICINE 0.6 MG PO TABS
0.6000 mg | ORAL_TABLET | Freq: Every day | ORAL | 0 refills | Status: DC
  Filled 2022-05-24: qty 5, 5d supply, fill #0

## 2022-05-24 MED ORDER — CEFAZOLIN SODIUM-DEXTROSE 2-4 GM/100ML-% IV SOLN
INTRAVENOUS | Status: AC
  Filled 2022-05-24: qty 100

## 2022-05-24 MED ORDER — MIDAZOLAM HCL 2 MG/2ML IJ SOLN
INTRAMUSCULAR | Status: DC | PRN
  Administered 2022-05-24: 2 mg via INTRAVENOUS

## 2022-05-24 MED ORDER — FUROSEMIDE 10 MG/ML IJ SOLN
INTRAMUSCULAR | Status: AC
  Filled 2022-05-24: qty 4

## 2022-05-24 MED ORDER — PHENYLEPHRINE 80 MCG/ML (10ML) SYRINGE FOR IV PUSH (FOR BLOOD PRESSURE SUPPORT)
PREFILLED_SYRINGE | INTRAVENOUS | Status: DC | PRN
  Administered 2022-05-24: 160 ug via INTRAVENOUS

## 2022-05-24 MED ORDER — ACETAMINOPHEN 325 MG PO TABS: 650.0000 mg | ORAL_TABLET | ORAL | Status: DC | PRN

## 2022-05-24 MED ORDER — PANTOPRAZOLE SODIUM 40 MG PO TBEC
40.0000 mg | DELAYED_RELEASE_TABLET | Freq: Every day | ORAL | 0 refills | Status: DC
  Filled 2022-05-24: qty 45, 45d supply, fill #0

## 2022-05-24 MED ORDER — ONDANSETRON HCL 4 MG/2ML IJ SOLN
INTRAMUSCULAR | Status: DC | PRN
  Administered 2022-05-24: 4 mg via INTRAVENOUS

## 2022-05-24 MED ORDER — CEFAZOLIN SODIUM-DEXTROSE 2-3 GM-%(50ML) IV SOLR
INTRAVENOUS | Status: DC | PRN
  Administered 2022-05-24: 2 g via INTRAVENOUS

## 2022-05-24 MED ORDER — APIXABAN 5 MG PO TABS
5.0000 mg | ORAL_TABLET | Freq: Two times a day (BID) | ORAL | Status: DC
  Administered 2022-05-24: 5 mg via ORAL
  Filled 2022-05-24: qty 1

## 2022-05-24 MED ORDER — ROCURONIUM BROMIDE 10 MG/ML (PF) SYRINGE
PREFILLED_SYRINGE | INTRAVENOUS | Status: DC | PRN
  Administered 2022-05-24: 100 mg via INTRAVENOUS

## 2022-05-24 MED ORDER — HEPARIN (PORCINE) IN NACL 1000-0.9 UT/500ML-% IV SOLN
INTRAVENOUS | Status: AC
  Filled 2022-05-24: qty 1000

## 2022-05-24 MED ORDER — SODIUM CHLORIDE 0.9% FLUSH: 3.0000 mL | INTRAVENOUS | Status: DC | PRN

## 2022-05-24 MED ORDER — SUGAMMADEX SODIUM 200 MG/2ML IV SOLN
INTRAVENOUS | Status: DC | PRN
  Administered 2022-05-24: 200 mg via INTRAVENOUS

## 2022-05-24 MED ORDER — ONDANSETRON HCL 4 MG/2ML IJ SOLN: 4.0000 mg | Freq: Four times a day (QID) | INTRAMUSCULAR | Status: DC | PRN

## 2022-05-24 MED ORDER — FUROSEMIDE 10 MG/ML IJ SOLN
INTRAMUSCULAR | Status: DC | PRN
  Administered 2022-05-24: 40 mg via INTRAMUSCULAR

## 2022-05-24 MED ORDER — LIDOCAINE 2% (20 MG/ML) 5 ML SYRINGE
INTRAMUSCULAR | Status: DC | PRN
  Administered 2022-05-24: 60 mg via INTRAVENOUS

## 2022-05-24 MED ORDER — PANTOPRAZOLE SODIUM 40 MG PO TBEC
40.0000 mg | DELAYED_RELEASE_TABLET | Freq: Every day | ORAL | Status: DC
  Administered 2022-05-24: 40 mg via ORAL
  Filled 2022-05-24: qty 1

## 2022-05-24 MED ORDER — SODIUM CHLORIDE 0.9 % IV SOLN: INTRAVENOUS | Status: DC

## 2022-05-24 MED ORDER — SODIUM CHLORIDE 0.9 % IV SOLN: 250.0000 mL | INTRAVENOUS | Status: DC | PRN

## 2022-05-24 SURGICAL SUPPLY — 19 items
BLANKET WARM UNDERBOD FULL ACC (MISCELLANEOUS) ×1 IMPLANT
CATH 8FR REPROCESSED SOUNDSTAR (CATHETERS) ×1 IMPLANT
CATH 8FR SOUNDSTAR REPROCESSED (CATHETERS) IMPLANT
CATH ABLAT QDOT MICRO BI TC DF (CATHETERS) IMPLANT
CATH OCTARAY 2.0 F 3-3-3-3-3 (CATHETERS) IMPLANT
CATH S-M CIRCA TEMP PROBE (CATHETERS) IMPLANT
CATH WEBSTER BI DIR CS D-F CRV (CATHETERS) IMPLANT
CLOSURE PERCLOSE PROSTYLE (VASCULAR PRODUCTS) IMPLANT
COVER SWIFTLINK CONNECTOR (BAG) ×1 IMPLANT
PACK EP LATEX FREE (CUSTOM PROCEDURE TRAY) ×1
PACK EP LF (CUSTOM PROCEDURE TRAY) ×1 IMPLANT
PAD DEFIB RADIO PHYSIO CONN (PAD) ×1 IMPLANT
PATCH CARTO3 (PAD) IMPLANT
SHEATH BAYLIS TRANSSEPTAL 98CM (NEEDLE) IMPLANT
SHEATH CARTO VIZIGO SM CVD (SHEATH) IMPLANT
SHEATH PINNACLE 8F 10CM (SHEATH) IMPLANT
SHEATH PINNACLE 9F 10CM (SHEATH) IMPLANT
SHEATH PROBE COVER 6X72 (BAG) IMPLANT
TUBING SMART ABLATE COOLFLOW (TUBING) IMPLANT

## 2022-05-24 NOTE — Interval H&P Note (Signed)
History and Physical Interval Note:  05/24/2022 7:36 AM  Jerry Howe  has presented today for surgery, with the diagnosis of afib.  The various methods of treatment have been discussed with the patient and family. After consideration of risks, benefits and other options for treatment, the patient has consented to  Procedure(s): ATRIAL FIBRILLATION ABLATION (N/A) TRANSESOPHAGEAL ECHOCARDIOGRAM (N/A) as a surgical intervention.  The patient's history has been reviewed, patient examined, no change in status, stable for surgery.  I have reviewed the patient's chart and labs.  Questions were answered to the patient's satisfaction.     Dorris Carnes

## 2022-05-24 NOTE — Progress Notes (Signed)
  Echocardiogram Echocardiogram Transesophageal has been performed.  Jerry Howe 05/24/2022, 8:15 AM

## 2022-05-24 NOTE — Discharge Instructions (Signed)
Post procedure care instructions No driving for 4 days. No lifting over 5 lbs for 1 week. No vigorous or sexual activity for 1 week. You may return to work/your usual activities on 06/01/22. Keep procedure site clean & dry. If you notice increased pain, swelling, bleeding or pus, call/return!  You may shower after 24 hours, but no soaking in baths/hot tubs/pools for 1 week.    You have an appointment set up with the At[facility] Fibrillation Clinic.  Multiple studies have shown that being followed by a dedicated atrial fibrillation clinic in addition to the standard care you receive from your other physicians improves health. We believe that enrollment in the atrial fibrillation clinic will allow us to better care for you.   The phone number to the At[facility] Fibrillation Clinic is [phone]. The clinic is staffed Monday through Friday from 8:30am to 5pm.  Directions: The clinic is located in the [facility], 6TH FLOOR Enter the hospital at the MAIN ENTRANCE "A", use North Tower Elevators to the 6th floor.  Registration desk to the right of elevators on 6th floor  If you have any trouble locating the clinic, please don't hesitate to call [phone].  

## 2022-05-24 NOTE — Anesthesia Preprocedure Evaluation (Signed)
Anesthesia Evaluation  Patient identified by MRN, date of birth, ID band Patient awake    Reviewed: Allergy & Precautions, H&P , NPO status , Patient's Chart, lab work & pertinent test results  Airway Mallampati: II   Neck ROM: full    Dental   Pulmonary neg pulmonary ROS   breath sounds clear to auscultation       Cardiovascular hypertension, +CHF  + dysrhythmias Atrial Fibrillation + Cardiac Defibrillator  Rhythm:regular Rate:Normal  EF 20-25%, global HK of LV.   Neuro/Psych  PSYCHIATRIC DISORDERS Anxiety        GI/Hepatic   Endo/Other    Renal/GU      Musculoskeletal   Abdominal   Peds  Hematology   Anesthesia Other Findings   Reproductive/Obstetrics                             Anesthesia Physical Anesthesia Plan  ASA: 3  Anesthesia Plan: General   Post-op Pain Management:    Induction: Intravenous  PONV Risk Score and Plan: 2 and Dexamethasone, Ondansetron, Midazolam and Treatment may vary due to age or medical condition  Airway Management Planned: Oral ETT  Additional Equipment:   Intra-op Plan:   Post-operative Plan: Extubation in OR  Informed Consent: I have reviewed the patients History and Physical, chart, labs and discussed the procedure including the risks, benefits and alternatives for the proposed anesthesia with the patient or authorized representative who has indicated his/her understanding and acceptance.     Dental advisory given  Plan Discussed with: CRNA, Anesthesiologist and Surgeon  Anesthesia Plan Comments:        Anesthesia Quick Evaluation

## 2022-05-24 NOTE — Transfer of Care (Signed)
Immediate Anesthesia Transfer of Care Note  Patient: Jerry Howe  Procedure(s) Performed: ATRIAL FIBRILLATION ABLATION TRANSESOPHAGEAL ECHOCARDIOGRAM  Patient Location: PACU  Anesthesia Type:General  Level of Consciousness: drowsy and patient cooperative  Airway & Oxygen Therapy: Patient Spontanous Breathing and Patient connected to nasal cannula oxygen  Post-op Assessment: Report given to RN and Post -op Vital signs reviewed and stable  Post vital signs: Reviewed and stable  Last Vitals:  Vitals Value Taken Time  BP 120/72 05/24/22 0957  Temp    Pulse 63 05/24/22 0958  Resp 18 05/24/22 0958  SpO2 96 % 05/24/22 0958  Vitals shown include unvalidated device data.  Last Pain:  Vitals:   05/24/22 0942  TempSrc:   PainSc: 0-No pain         Complications: There were no known notable events for this encounter.

## 2022-05-24 NOTE — Anesthesia Procedure Notes (Addendum)
Procedure Name: Intubation Date/Time: 05/24/2022 7:48 AM  Performed by: Lance Coon, CRNAPre-anesthesia Checklist: Patient identified, Emergency Drugs available, Suction available, Patient being monitored and Timeout performed Patient Re-evaluated:Patient Re-evaluated prior to induction Oxygen Delivery Method: Circle system utilized Preoxygenation: Pre-oxygenation with 100% oxygen Induction Type: IV induction Ventilation: Mask ventilation without difficulty Laryngoscope Size: Miller and 3 Grade View: Grade I Tube type: Oral Tube size: 7.5 mm Number of attempts: 1 Airway Equipment and Method: Stylet Placement Confirmation: ETT inserted through vocal cords under direct vision, positive ETCO2 and breath sounds checked- equal and bilateral Secured at: 21 cm Tube secured with: Tape Dental Injury: Teeth and Oropharynx as per pre-operative assessment

## 2022-05-24 NOTE — H&P (Signed)
Electrophysiology Office Follow up Visit Note:     Date:  05/24/2022    ID:  DILLIAN FEIG, DOB 1955/06/04, MRN [id number]   PCP:  Gladstone Lighter, MD   [facility] Cardiologist:  Ida Rogue, MD  [facility] Electrophysiologist:  Vickie Epley, MD      Interval History:     Jerry Howe is a 67 y.o. male who presents for a follow up visit. They were last seen in clinic 01/27/2022 to establish him with our clinic for remote monitoring of his ICD.  After our appointment he was admitted 02/07/2022 with new diagnosis AF with RVR and inappropriate shocks. He was started on amiodarone for rhythm control and is seeing me today to discuss possible ablation to help avoid long term exposure to amiodarone.   Today he tells me he was at church when his ICD shocked him.  He did not know what was happening when the shock started occurring.  It shocked him at least 3 times that he recalls.  He never lost consciousness.  He takes his Eliquis twice daily.  He is now taking amiodarone for rhythm control and has not had another episode of arrhythmia that he is aware of.  He tells me that he feels significantly better since starting medical therapy and starting to watch his diet and fluid intake after his hospitalization.  His heart failure symptoms were at least class III at their worst.  He was having significant fluid retention in his abdomen and could barely walk up a flight of steps.  Now he is back exercising without any shortness of breath or limiting symptoms.  He is overall very happy with his progress.   Presents for PVI today.       Objective      Past Medical History:  Diagnosis Date   Anxiety     Arrhythmia     CHF (congestive heart failure) ([place])     Hypertension     Kidney stones     Shortness of breath dyspnea             Past Surgical History:  Procedure Laterality Date   CARDIAC CATHETERIZATION Right 11/20/2014    Procedure: Left Heart Cath and Coronary Angiography;   Surgeon: Isaias Cowman, MD;  Location: [place] [facility];  Service: Cardiovascular;  Laterality: Right;   IMPLANTABLE CARDIOVERTER DEFIBRILLATOR (ICD) GENERATOR CHANGE Left 08/15/2015    Procedure: ICD IMPLANT, single chamber;  Surgeon: Marzetta Board, MD;  Location: [facility] OR[facility];  Service: Cardiovascular;  Laterality: Left;   KNEE SURGERY       RIGHT/LEFT HEART CATH AND CORONARY ANGIOGRAPHY N/A 02/10/2022    Procedure: RIGHT/LEFT HEART CATH AND CORONARY ANGIOGRAPHY;  Surgeon: Nelva Bush, MD;  Location: [place] [facility];  Service: Cardiovascular;  Laterality: N/A;      Current Medications: Active Medications      Current Meds  Medication Sig   albuterol (PROVENTIL HFA;VENTOLIN HFA) 108 (90 Base) MCG/ACT inhaler Inhale 1 puff into the lungs every 6 (six) hours as needed for wheezing or shortness of breath.   amiodarone (PACERONE) 200 MG tablet Take 2 tablets (400 mg total) by mouth 2 (two) times daily. amiodarone 400 twice daily 1 week then down to 200 twice daily 1 week then down to 200 daily   Ascorbic Acid (VITAMIN C) 1000 MG tablet Take 1,000 mg by mouth daily.   cyanocobalamin 2000 MCG tablet Take 2,000 mcg by mouth daily.   empagliflozin (JARDIANCE) 10  MG TABS tablet Take 1 tablet (10 mg total) by mouth daily before breakfast.   Magnesium 200 MG TABS Take 200 mg by mouth daily.   metoprolol succinate (TOPROL-XL) 25 MG 24 hr tablet Take 25 mg by mouth daily.   Multiple Vitamins-Minerals (SUPER THERA VITE M PO) Take 1 tablet by mouth daily.   omeprazole (PRILOSEC) 20 MG capsule Take by mouth.   pyridoxine (B-6) 500 MG tablet Take 500 mg by mouth daily.   sacubitril-valsartan (ENTRESTO) 24-26 MG Take 1 tablet (24-26 mg) by mouth twice daily   spironolactone (ALDACTONE) 25 MG tablet Take 1 tablet by mouth daily.   torsemide (DEMADEX) 20 MG tablet Take 1 tablet (20 mg total) by mouth daily as needed.   torsemide (DEMADEX) 20 MG tablet Take 1 tablet (20 mg total) by mouth  daily as needed.        Allergies:   Shellfish allergy and Rosuvastatin    Social History         Socioeconomic History   Marital status: Married      Spouse name: Joelene Millin   Number of children: 5   Years of education: Not on file   Highest education level: Not on file  Occupational History   Not on file  Tobacco Use   Smoking status: Never   Smokeless tobacco: Never  Vaping Use   Vaping Use: Never used  Substance and Sexual Activity   Alcohol use: No   Drug use: No   Sexual activity: Yes      Partners: Female  Other Topics Concern   Not on file  Social History Narrative    Lives at home with spouse; 5 kids, 52 grandkids & 2 great grandkids    Social Determinants of Health        Financial Resource Strain: Not on file  Food Insecurity: No Food Insecurity (02/08/2022)    Hunger Vital Sign     Worried About Running Out of Food in the Last Year: Never true     Ran Out of Food in the Last Year: Never true  Transportation Needs: No Transportation Needs (02/08/2022)    PRAPARE - Armed forces logistics/support/administrative officer (Medical): No     Lack of Transportation (Non-Medical): No  Physical Activity: Not on file  Stress: Not on file  Social Connections: Not on file      Family History: The patient's family history includes Diabetes in his brother; Heart disease in his father; Hyperlipidemia in his brother and mother; Hypertension in his father. There is no history of Heart failure.   ROS:   Please see the history of present illness.    All other systems reviewed and are negative.   EKGs/Labs/Other Studies Reviewed:     The following studies were reviewed today:   March 17, 2022 in clinic device interrogation personally reviewed Lead and battery parameters stable No additional high-voltage therapies since hospitalization   02/09/2022 Echo EF 20-25% RV normal Small Peff Mild-mod MR   02/10/2022 [facility] Conclusions: Mild, nonobstructive coronary artery  disease with 10-20% stenosis involving the proximal LAD.  No significant disease involving ramus intermedius, LCx, or RCA. Moderately-severely elevated left heart filling pressures (PCWP 30 mmHg, LVEDP 40 mmHg).  Prominent V waves on PCWP tracing suggest moderate-severe mitral regurgitation. Severe pulmonary hypertension (mean PAP 50 mmHg, PVR 4.3 WU). Moderately elevated right heart filling pressure (mean RA 14 mmHg, RV EDP 13 mmHg). Mildly reduced Fick cardiac output/index (CO 4.7 L/min, CI 2.2  L/min/m^2).     Recent Labs: 02/07/2022: ALT 15; TSH 0.835 02/08/2022: Magnesium 2.0 02/13/2022: Hemoglobin 16.6; Platelets 234 02/22/2022: B Natriuretic Peptide 168.7; BUN 29; Creatinine, Ser 1.97; Potassium 4.5; Sodium 137  Recent Lipid Panel Labs (Brief)          Component Value Date/Time    CHOL 184 11/19/2014 0443    TRIG 188 (H) 11/19/2014 0443    HDL 40 (L) 11/19/2014 0443    CHOLHDL 4.6 11/19/2014 0443    VLDL 38 11/19/2014 0443    LDLCALC 106 (H) 11/19/2014 0443        Physical Exam:     VS:  BP 138/77   Pulse 67   Ht 5\' 8"  (1.727 m)   Wt 215 lb (97.5 kg)   SpO2 98%   BMI 32.69 kg/m         Wt Readings from Last 3 Encounters:  03/17/22 215 lb (97.5 kg)  02/22/22 212 lb (96.2 kg)  02/12/22 207 lb 14.4 oz (94.3 kg)      GEN:  Well nourished, well developed in no acute distress HEENT: Normal NECK: No JVD; No carotid bruits LYMPHATICS: No lymphadenopathy CARDIAC: RRR, no murmurs, rubs, gallops.  ICD pocket well-healed RESPIRATORY:  Clear to auscultation without rales, wheezing or rhonchi  ABDOMEN: Soft, non-tender, non-distended MUSCULOSKELETAL:  No edema; No deformity  SKIN: Warm and dry NEUROLOGIC:  Alert and oriented x 3 PSYCHIATRIC:  Normal affect            Assessment ASSESSMENT:     1. Chronic systolic heart failure ([place])   2. PAF (paroxysmal atrial fibrillation) ([facility])     PLAN:     In order of problems listed above:     #Paroxysmal atrial  fibrillation New diagnosis. Led to inappropriate ICD shocks. On amiodarone 200 daily  Cont eliquis   Discussed treatment options today for their AF including antiarrhythmic drug therapy and ablation. Discussed risks, recovery and likelihood of success. Discussed potential need for repeat ablation procedures and antiarrhythmic drugs after an initial ablation. They wish to proceed with scheduling.   Risk, benefits, and alternatives to EP study and radiofrequency ablation for afib were also discussed in detail today. These risks include but are not limited to stroke, bleeding, vascular damage, tamponade, perforation, damage to the esophagus, lungs, and other structures, pulmonary vein stenosis, worsening renal function, and death. The patient understands these risk and wishes to proceed.  We will therefore proceed with catheter ablation at the next available time.  Carto, ICE, anesthesia are requested for the procedure.  Will also obtain CT PV protocol prior to the procedure to exclude LAA thrombus and further evaluate atrial anatomy.     #Chronic systolic HF Follows with DB. NYHA I today.  Warm and euvolemic today. EF 20-25% with at least moderate MR. His symptoms have improved significantly on medical therapy. Rhythm control indicated as above.   #ICD in situ Device functioning appropriately. Continue remote monitoring.      Presents today for PVI. Procedure reviewed.     Signed, Lars Mage, MD, [facility], [facility] 05/24/2022 Electrophysiology  [facility]

## 2022-05-24 NOTE — CV Procedure (Signed)
TEE  Indication:  Atrial fibrillation, pre ablation TEE   Pt intubated and sedated by anesthesia With jaw thrust, TEE probe advanced easily through mouth and into esophagus   LA appendage large.   Prominent pectinates   No thrombus LA and RA without masses  LVEF severely depressed 20 to 25% RVEF depressed  TV normal    AV normal   Mild AI MV is normal    Mild to moderate MR PV normal   Mild PI  No PFO as tested by color doppler only  Minimal plaquing on thoracic aorta  Procedure without complication  Dorris Carnes MD

## 2022-05-25 NOTE — Anesthesia Postprocedure Evaluation (Signed)
Anesthesia Post Note  Patient: Jerry Howe  Procedure(s) Performed: ATRIAL FIBRILLATION ABLATION TRANSESOPHAGEAL ECHOCARDIOGRAM     Patient location during evaluation: PACU Anesthesia Type: General Level of consciousness: awake and alert Pain management: pain level controlled Vital Signs Assessment: post-procedure vital signs reviewed and stable Respiratory status: spontaneous breathing, nonlabored ventilation, respiratory function stable and patient connected to nasal cannula oxygen Cardiovascular status: blood pressure returned to baseline and stable Postop Assessment: no apparent nausea or vomiting Anesthetic complications: no   There were no known notable events for this encounter.  Last Vitals:  Vitals:   05/24/22 1130 05/24/22 1200  BP: 117/75 103/70  Pulse: 66 (!) 59  Resp: 11 18  Temp:    SpO2: 95% 95%    Last Pain:  Vitals:   05/24/22 1200  TempSrc:   PainSc: 0-No pain                 Kali Ambler S

## 2022-05-26 MED FILL — Cefazolin Sodium For Inj 2 GM: INTRAMUSCULAR | Qty: 2 | Status: AC

## 2022-05-28 ENCOUNTER — Other Ambulatory Visit (HOSPITAL_COMMUNITY): Payer: Self-pay

## 2022-05-31 ENCOUNTER — Telehealth: Payer: Self-pay | Admitting: Cardiology

## 2022-05-31 NOTE — Telephone Encounter (Signed)
Spoke with the patient who reports that his insertion site is a little sore and swollen. There is a small knot that has not grown in size. No redness or drainage. Advised patient that this was normal and it should improve overtime. If site begins to look worse or become more painful then he will let us know.

## 2022-05-31 NOTE — Telephone Encounter (Signed)
Patient states he is still a little sore and swollen after 2/05 ablation. He also mentions he has had itchiness.

## 2022-06-07 ENCOUNTER — Ambulatory Visit: Payer: Medicare HMO | Attending: Internal Medicine | Admitting: Internal Medicine

## 2022-06-07 VITALS — BP 130/68 | HR 61 | Wt 211.1 lb

## 2022-06-07 DIAGNOSIS — I130 Hypertensive heart and chronic kidney disease with heart failure and stage 1 through stage 4 chronic kidney disease, or unspecified chronic kidney disease: Secondary | ICD-10-CM | POA: Diagnosis not present

## 2022-06-07 DIAGNOSIS — Z7984 Long term (current) use of oral hypoglycemic drugs: Secondary | ICD-10-CM | POA: Insufficient documentation

## 2022-06-07 DIAGNOSIS — I428 Other cardiomyopathies: Secondary | ICD-10-CM | POA: Diagnosis not present

## 2022-06-07 DIAGNOSIS — Z95810 Presence of automatic (implantable) cardiac defibrillator: Secondary | ICD-10-CM | POA: Diagnosis not present

## 2022-06-07 DIAGNOSIS — I272 Pulmonary hypertension, unspecified: Secondary | ICD-10-CM | POA: Insufficient documentation

## 2022-06-07 DIAGNOSIS — I480 Paroxysmal atrial fibrillation: Secondary | ICD-10-CM

## 2022-06-07 DIAGNOSIS — N183 Chronic kidney disease, stage 3 unspecified: Secondary | ICD-10-CM

## 2022-06-07 DIAGNOSIS — I5022 Chronic systolic (congestive) heart failure: Secondary | ICD-10-CM | POA: Diagnosis not present

## 2022-06-07 DIAGNOSIS — Z7901 Long term (current) use of anticoagulants: Secondary | ICD-10-CM | POA: Diagnosis not present

## 2022-06-07 DIAGNOSIS — Z79899 Other long term (current) drug therapy: Secondary | ICD-10-CM | POA: Diagnosis not present

## 2022-06-07 DIAGNOSIS — N1831 Chronic kidney disease, stage 3a: Secondary | ICD-10-CM | POA: Diagnosis not present

## 2022-06-07 DIAGNOSIS — I2510 Atherosclerotic heart disease of native coronary artery without angina pectoris: Secondary | ICD-10-CM | POA: Diagnosis not present

## 2022-06-07 DIAGNOSIS — I493 Ventricular premature depolarization: Secondary | ICD-10-CM

## 2022-06-07 NOTE — Patient Instructions (Signed)
Medication Changes:  Continue taking all of your medications every day as prescribed   Special Instructions // Education:  Do the following things EVERYDAY: Weigh yourself in the morning before breakfast. Write it down and keep it in a log. Take your medicines as prescribed Eat low salt foods--Limit salt (sodium) to 2000 mg per day.  Stay as active as you can everyday Limit all fluids for the day to less than 2 liters   Follow-Up in: 3 months with Dr. Haroldine Laws. We will call you to schedule closer to the time of appointment.     If you have any questions or concerns before your next appointment please send [place] a message through [place] or call our office at [phone] Monday-Friday 8 am-5 pm.   If you have an urgent need after hours on the weekend please call your Primary Cardiologist or the [facility] in [place] at [phone].

## 2022-06-07 NOTE — Progress Notes (Signed)
ADVANCED HF CLINIC NOTE   PCP: Jerry Lighter, MD Primary Cardiologist: Jerry Rogue, MD   HPI  Jerry Howe is a 67 y/o male with systolic HF due to NICM, PAF, HTN he is being seen today for evaluation of heart failure at the request of Dr. Max Howe   Jerry Howe was diagnosed with heart failure in 2016.  LHC at that time revealed minimal CAD.  LVEF was 25%.  He was followed by [facility] clinic and recently transferred to [facility] heart care.  He established with Dr. Rockey Howe on 12/2021 and Dr. Quentin Howe on 01/2022.  He reported NYHA Class II symptoms and lisinopril switch to Entresto.  He remained on spironolactone and metoprolol.  He established with Dr. Quentin Howe on 01/2022 he was stable and doing well.  He has no history of atrial fibrillation.   Echo 3/20 EF 25-30% LVIDD 7.6 Echo 02/09/22  EF 20-25% RV ok  LVIDD 7.5   At baseline NYHA III. Says he stopped lasix earlier this year and got worse but restarted 2 months ago and was a bit better.   He presented to the ED on 10/22 after his defibrillator fired 6 times at [employer] after he got into a heated discussion.  In the ED he was noted to be in atrial fibrillation.  He converted on his own. Seen by Dr. Caryl Howe and ICD interogation showed inappropriate ICD firing for AF. Started on amio. Also found to have frequent PVCs.    Cath 02/10/22  LAD 10-20%prox. RCA and LCX ok  RA 14 RV 90/13 PA 90/30 (50) PCWP 30 with v-waves 45 Ao sat 96% PA sat 67% Fick 4.7/2.2 PVR 4.3 WU    CPX 03/22/22  Resting HR: 67 Standing HR: 67 Peak HR: 123   (80% age predicted Jerry HR)  BP rest: 118/82 BP standing: 118/78 BP peak: 194/82  Peak VO2: 13.9 (47.9% predicted peak VO2)  VE/VCO2 slope:  36.9  OUES: 1.58  Peak RER: 0.96  VE/MVV:  43.4%  O2pulse:  28m/beat   (69% predicted O2pulse)   Had AF ablation on 05/24/22. TEE at that time EF 20-25% Mild to moderate MR  Returns today for f/u with his wife.  He works as a pTheme park manager Says he feel a whole lot better.  Denies SOB with ADLs. No CP, edema, orthopnea or PND. Compliant with meds. Watching intake. SBP at home 100-110. Occasionally gets lightheaded.   ROS: All systems negative except as listed in HPI, PMH and Problem List.  SH:  Social History   Socioeconomic History   Marital status: Married    Spouse name: Jerry Howe  Number of children: 5   Years of education: Not on file   Highest education level: Not on file  Occupational History   Not on file  Tobacco Use   Smoking status: Never   Smokeless tobacco: Never  Vaping Use   Vaping Use: Never used  Substance and Sexual Activity   Alcohol use: No   Drug use: No   Sexual activity: Yes    Partners: Female  Other Topics Concern   Not on file  Social History Narrative   Lives at home with spouse; 5 kids, 175grandkids & 2 great grandkids   Social Determinants of Health   Financial Resource Strain: Not on file  Food Insecurity: No Food Insecurity (02/08/2022)   Hunger Vital Sign    Worried About Running Out of Food in the Last Year: Never true    Ran Out  of Food in the Last Year: Never true  Transportation Needs: No Transportation Needs (02/08/2022)   PRAPARE - Hydrologist (Medical): No    Lack of Transportation (Non-Medical): No  Physical Activity: Not on file  Stress: Not on file  Social Connections: Not on file  Intimate Partner Violence: Not At Risk (02/08/2022)   Humiliation, Afraid, Rape, and Kick questionnaire    Fear of Current or Ex-Partner: No    Emotionally Abused: No    Physically Abused: No    Sexually Abused: No    FH:  Family History  Problem Relation Age of Onset   Hyperlipidemia Mother    Hypertension Father    Heart disease Father    Hyperlipidemia Brother    Diabetes Brother    Heart failure Neg Hx     Past Medical History:  Diagnosis Date   Anxiety    Arrhythmia    CHF (congestive heart failure) ([place])    Hypertension    Kidney stones    Shortness of breath  dyspnea     Current Outpatient Medications  Medication Sig Dispense Refill   albuterol (PROVENTIL HFA;VENTOLIN HFA) 108 (90 Base) MCG/ACT inhaler Inhale 1 puff into the lungs every 6 (six) hours as needed for wheezing or shortness of breath.     amiodarone (PACERONE) 200 MG tablet Take 200 mg by mouth daily.     apixaban (ELIQUIS) 5 MG TABS tablet Take 1 tablet (5 mg total) by mouth 2 (two) times daily. 60 tablet 5   Ascorbic Acid (VITAMIN C) 1000 MG tablet Take 1,000 mg by mouth daily.     empagliflozin (JARDIANCE) 10 MG TABS tablet Take 1 tablet (10 mg total) by mouth daily before breakfast. 30 tablet 6   Magnesium 200 MG TABS Take 200 mg by mouth daily.     metoprolol succinate (TOPROL-XL) 25 MG 24 hr tablet Take 25 mg by mouth daily.     Multiple Vitamins-Minerals (SUPER THERA VITE M PO) Take 1 tablet by mouth daily.     Naphazoline-Glycerin (REDNESS RELIEF OP) Place 1 drop into both eyes 2 (two) times daily.     pantoprazole (PROTONIX) 40 MG tablet Take 1 tablet (40 mg total) by mouth daily. 45 tablet 0   pyridoxine (B-6) 500 MG tablet Take 500 mg by mouth daily.     sacubitril-valsartan (ENTRESTO) 24-26 MG Take 1 tablet (24-26 mg) by mouth twice daily 60 tablet 6   spironolactone (ALDACTONE) 25 MG tablet Take 1 tablet by mouth daily.     torsemide (DEMADEX) 20 MG tablet Take 1 tablet (20 mg total) by mouth daily as needed. 30 tablet 1   cyanocobalamin 2000 MCG tablet Take 2,000 mcg by mouth daily.     No current facility-administered medications for this visit.    Vitals:   06/07/22 1350  BP: 130/68  Pulse: 61  SpO2: 100%  Weight: 211 lb 2 oz (95.8 kg)    PHYSICAL EXAM: General:  Well appearing. No resp difficulty HEENT: normal Neck: supple. no JVD. Carotids 2+ bilat; no bruits. No lymphadenopathy or thryomegaly appreciated. Cor: PMI nondisplaced. Regular rate & rhythm. No rubs, gallops or murmurs. Lungs: clear Abdomen: soft, nontender, nondistended. No hepatosplenomegaly.  No bruits or masses. Good bowel sounds. Extremities: no cyanosis, clubbing, rash, edema Neuro: alert & orientedx3, cranial nerves grossly intact. moves all 4 extremities w/o difficulty. Affect pleasant   ASSESSMENT & PLAN:  1. Chronic systolic HF - due to NICM. Unclear etiology.  Has not had cMRI due to ICD - Echo 2016 EF 25% - Echo 3/20 EF 25-30% LVIDD 7.6 - Echo 02/09/22  EF 20-25% RV ok  LVIDD 7.5 G3DD - s/p MDT ICD - Cath 02/10/22 with minimal CAD (LAD 10-20%) markedly elevated filling pressures, severe pulmonary venous HTN and moderately reduced CO in setting of restrictive physiology - CPX 03/22/22 pRER 0.96  pVO2: 13.9 (47.9%) VE/VCO2 slope:  36.9 OUES: 1.58  VE/MVV:  43.4%  - TEE 05/24/21 EF 25-30% RV moderately decreased - Doing well NYHA II despite persistent LV dysfunction - Volume status ok on torsemide 20 daily - Continue Entresto 24/26 bid - Continue Spiro 25 - Continue Toprol 25 - Continue Jardiance 10 - Will not increase GDMT with soft BP and occasional orthostasis - ICD interrogation: No VT/AF. Volume up on device but not on exam. Will continue current therapy for now. Activity level > 3 hr/day - CPX testing is submax test with moderate HF limitation. Doing well. Not ready for advanced therapies at this time. Follow closely   2. PAF - now s/p ICD inappropriate shock x 6 in 10/23 - on amio 200 daily - s/p AF ablation 05/24/22 - in NSR today - continue Eliquis. - ICD interrogation as above   3. Frequent PVCs  - suppressed on amio    4. CKD 3a-3b  - baseline SCr 1.3-1.8 [ Recent Scr 1.5 - Continue Jardiance   Glori Bickers, MD  2:16 PM

## 2022-06-10 ENCOUNTER — Encounter: Payer: Self-pay | Admitting: Internal Medicine

## 2022-06-21 ENCOUNTER — Encounter (HOSPITAL_COMMUNITY): Payer: Self-pay | Admitting: Physician Assistant

## 2022-06-21 ENCOUNTER — Ambulatory Visit (HOSPITAL_COMMUNITY)
Admission: RE | Admit: 2022-06-21 | Discharge: 2022-06-21 | Disposition: A | Discharge status: Home / Self Care | Payer: Medicare HMO | Source: Ambulatory Visit | Attending: Physician Assistant | Admitting: Physician Assistant

## 2022-06-21 VITALS — BP 124/90 | HR 63 | Ht 68.0 in | Wt 202.2 lb

## 2022-06-21 DIAGNOSIS — I454 Nonspecific intraventricular block: Secondary | ICD-10-CM | POA: Diagnosis not present

## 2022-06-21 DIAGNOSIS — I480 Paroxysmal atrial fibrillation: Secondary | ICD-10-CM | POA: Diagnosis not present

## 2022-06-21 DIAGNOSIS — D6869 Other thrombophilia: Secondary | ICD-10-CM | POA: Diagnosis not present

## 2022-06-21 DIAGNOSIS — I5022 Chronic systolic (congestive) heart failure: Secondary | ICD-10-CM | POA: Insufficient documentation

## 2022-06-21 DIAGNOSIS — I110 Hypertensive heart disease with heart failure: Secondary | ICD-10-CM | POA: Insufficient documentation

## 2022-06-21 DIAGNOSIS — I5043 Acute on chronic combined systolic (congestive) and diastolic (congestive) heart failure: Secondary | ICD-10-CM | POA: Diagnosis not present

## 2022-06-21 DIAGNOSIS — Z7901 Long term (current) use of anticoagulants: Secondary | ICD-10-CM | POA: Diagnosis not present

## 2022-06-21 DIAGNOSIS — I2510 Atherosclerotic heart disease of native coronary artery without angina pectoris: Secondary | ICD-10-CM | POA: Diagnosis not present

## 2022-06-21 DIAGNOSIS — Z79899 Other long term (current) drug therapy: Secondary | ICD-10-CM | POA: Insufficient documentation

## 2022-06-21 DIAGNOSIS — I428 Other cardiomyopathies: Secondary | ICD-10-CM | POA: Insufficient documentation

## 2022-06-21 NOTE — Progress Notes (Signed)
Primary Care Physician: Gladstone Lighter, MD Primary Cardiologist: Dr. Rockey Situ Primary Electrophysiologist: Dr. Quentin Ore Primary advanced HF: Dr. Threasa Beards is a 67 y.o. male with a history of systolic HF due to NICM, HTN, and atrial fibrillation who presents for follow up in the [facility].  The patient was initially diagnosed with atrial fibrillation 01/2022 after presenting to ED due to defibrillator firing 6 times. ICD interrogation showed inappropriate ICD firing for AF. Started on amiodarone. He is now s/p ablation on 05/24/22. Patient is on eliquis for a CHADS2VASC score of 4.  Most recent echocardiogram on 02/09/22 shows EF 20-25% and moderately dilated LA.   Right/left heart cath 02/10/22 showed mild nonobstructive CAD with 10-20% stenosis involving the proximal LAD.   On follow up today 1 month s/p ablation, he fells well overall. He is compliant with eliquis and amiodarone.    Occasionally gets lightheaded. Leg incision sites healed without issue. Denies trouble swallowing.   Today, he denies symptoms of palpitations, chest pain, shortness of breath, orthopnea, PND, lower extremity edema, dizziness, presyncope, syncope, snoring, daytime somnolence, bleeding, or neurologic sequela. The patient is tolerating medications without difficulties and is otherwise without complaint today.    Atrial Fibrillation Risk Factors:  he does not have a history of rheumatic fever. he does not have a history of alcohol use. The patient does not have a history of early familial atrial fibrillation or other arrhythmias.  he has a BMI of Body mass index is 30.74 kg/m.Marland Kitchen Filed Weights   06/21/22 1144  Weight: 91.7 kg    Family History  Problem Relation Age of Onset   Hyperlipidemia Mother    Hypertension Father    Heart disease Father    Hyperlipidemia Brother    Diabetes Brother    Heart failure Neg Hx      Atrial Fibrillation Management  history:  Previous antiarrhythmic drugs: amiodarone  Previous cardioversions: None Previous ablations: 05/24/22 CHADS2VASC score: 4 Anticoagulation history: Eliquis   Past Medical History:  Diagnosis Date   Anxiety    Arrhythmia    CHF (congestive heart failure) ([place])    Hypertension    Kidney stones    Shortness of breath dyspnea    Past Surgical History:  Procedure Laterality Date   ATRIAL FIBRILLATION ABLATION N/A 05/24/2022   Procedure: ATRIAL FIBRILLATION ABLATION;  Surgeon: Vickie Epley, MD;  Location: [facility];  Service: Cardiovascular;  Laterality: N/A;   CARDIAC CATHETERIZATION Right 11/20/2014   Procedure: Left Heart Cath and Coronary Angiography;  Surgeon: Isaias Cowman, MD;  Location: [place] [facility];  Service: Cardiovascular;  Laterality: Right;   IMPLANTABLE CARDIOVERTER DEFIBRILLATOR (ICD) GENERATOR CHANGE Left 08/15/2015   Procedure: ICD IMPLANT, single chamber;  Surgeon: Marzetta Board, MD;  Location: [facility];  Service: Cardiovascular;  Laterality: Left;   KNEE SURGERY     RIGHT/LEFT HEART CATH AND CORONARY ANGIOGRAPHY N/A 02/10/2022   Procedure: RIGHT/LEFT HEART CATH AND CORONARY ANGIOGRAPHY;  Surgeon: Nelva Bush, MD;  Location: [place] [facility];  Service: Cardiovascular;  Laterality: N/A;   TEE WITHOUT CARDIOVERSION N/A 05/24/2022   Procedure: TRANSESOPHAGEAL ECHOCARDIOGRAM;  Surgeon: Vickie Epley, MD;  Location: [place] [facility];  Service: Cardiovascular;  Laterality: N/A;    Current Outpatient Medications  Medication Sig Dispense Refill   albuterol (PROVENTIL HFA;VENTOLIN HFA) 108 (90 Base) MCG/ACT inhaler Inhale 1 puff into the lungs every 6 (six) hours as needed for wheezing or shortness  of breath.     amiodarone (PACERONE) 200 MG tablet Take 200 mg by mouth daily.     apixaban (ELIQUIS) 5 MG TABS tablet Take 1 tablet (5 mg total) by mouth 2 (two) times daily. 60 tablet 5   Ascorbic Acid (VITAMIN C) 1000 MG tablet Take  1,000 mg by mouth daily.     cyanocobalamin 2000 MCG tablet Take 2,000 mcg by mouth daily.     empagliflozin (JARDIANCE) 10 MG TABS tablet Take 1 tablet (10 mg total) by mouth daily before breakfast. 30 tablet 6   Magnesium 200 MG TABS Take 200 mg by mouth daily.     metoprolol succinate (TOPROL-XL) 25 MG 24 hr tablet Take 25 mg by mouth daily.     Multiple Vitamins-Minerals (SUPER THERA VITE M PO) Take 1 tablet by mouth daily.     Naphazoline-Glycerin (REDNESS RELIEF OP) Place 1 drop into both eyes 2 (two) times daily.     pantoprazole (PROTONIX) 40 MG tablet Take 1 tablet (40 mg total) by mouth daily. 45 tablet 0   pyridoxine (B-6) 500 MG tablet Take 500 mg by mouth daily.     sacubitril-valsartan (ENTRESTO) 24-26 MG Take 1 tablet (24-26 mg) by mouth twice daily 60 tablet 6   spironolactone (ALDACTONE) 25 MG tablet Take 1 tablet by mouth daily.     torsemide (DEMADEX) 20 MG tablet Take 1 tablet (20 mg total) by mouth daily as needed. 30 tablet 1   No current facility-administered medications for this encounter.    Allergies  Allergen Reactions   Shellfish Allergy Anaphylaxis   Rosuvastatin     Dizziness     Social History   Socioeconomic History   Marital status: Married    Spouse name: Joelene Millin   Number of children: 5   Years of education: Not on file   Highest education level: Not on file  Occupational History   Not on file  Tobacco Use   Smoking status: Never   Smokeless tobacco: Never   Tobacco comments:    Never smoke 06/21/22  Vaping Use   Vaping Use: Never used  Substance and Sexual Activity   Alcohol use: No   Drug use: Not Currently   Sexual activity: Yes    Partners: Female  Other Topics Concern   Not on file  Social History Narrative   Lives at home with spouse; 5 kids, 26 grandkids & 2 great grandkids   Social Determinants of Health   Financial Resource Strain: Not on file  Food Insecurity: No Food Insecurity (02/08/2022)   Hunger Vital Sign     Worried About Running Out of Food in the Last Year: Never true    Ran Out of Food in the Last Year: Never true  Transportation Needs: No Transportation Needs (02/08/2022)   PRAPARE - Hydrologist (Medical): No    Lack of Transportation (Non-Medical): No  Physical Activity: Not on file  Stress: Not on file  Social Connections: Not on file  Intimate Partner Violence: Not At Risk (02/08/2022)   Humiliation, Afraid, Rape, and Kick questionnaire    Fear of Current or Ex-Partner: No    Emotionally Abused: No    Physically Abused: No    Sexually Abused: No     ROS- All systems are reviewed and negative except as per the HPI above.  Physical Exam: Vitals:   06/21/22 1144  BP: (!) 124/90  Pulse: 63  Weight: 91.7 kg  Height: '5\' 8"'$  (  1.727 m)    GEN- The patient is a well appearing male, alert and oriented x 3 today.   Head- normocephalic, atraumatic Eyes-  Sclera clear, conjunctiva pink Ears- hearing intact Oropharynx- clear Neck- supple  Lungs- Clear to ausculation bilaterally, normal work of breathing Heart- Regular rate and rhythm, no murmurs, rubs or gallops  GI- soft, NT, ND, + BS Extremities- no clubbing, cyanosis, or edema MS- no significant deformity or atrophy Skin- no rash or lesion Psych- euthymic mood, full affect Neuro- strength and sensation are intact  Wt Readings from Last 3 Encounters:  06/21/22 91.7 kg  06/07/22 95.8 kg  05/24/22 94.3 kg    EKG today demonstrates  SR - HR 63 Nonspecific intraventricular block Nonspecific T wave abnormality  Echo 02/09/22 demonstrated   1. Left ventricular ejection fraction, by estimation, is 20 to 25%. The  left ventricle has severely decreased function. The left ventricle  demonstrates global hypokinesis. The left ventricular internal cavity size  was severely dilated. There is mild left ventricular hypertrophy. Left ventricular diastolic parameters are consistent with Grade III diastolic  dysfunction (restrictive). Elevated left atrial pressure.   2. Right ventricular systolic function is normal. The right ventricular  size is normal. There is normal pulmonary artery systolic pressure.   3. Left atrial size was moderately dilated.   4. Right atrial size was mildly dilated.   5. A small pericardial effusion is present. The pericardial effusion is  circumferential.   6. The mitral valve is normal in structure. Mild to moderate mitral valve  regurgitation.   7. The aortic valve is tricuspid. There is mild thickening of the aortic  valve. Aortic valve regurgitation is mild. Aortic valve sclerosis is  present, with no evidence of aortic valve stenosis.   8. The inferior vena cava is normal in size with <50% respiratory  variability, suggesting right atrial pressure of 8 mmHg.    Epic records are reviewed at length today  CHA2DS2-VASc Score = 4  The patient's score is based upon: CHF History: 1 HTN History: 1 Diabetes History: 0 Stroke History: 0 Vascular Disease History: 1 Age Score: 1 Gender Score: 0       ASSESSMENT AND PLAN: Paroxysmal Atrial Fibrillation (ICD10:  I48.0) The patient's CHA2DS2-VASc score is 4, indicating a 4.8% annual risk of stroke.   S/p ablation on 05/24/22.  He is in SR today and doing well without acute concerns. We will plan to continue amiodarone daily for now, consider discontinuation at 3 month f/u with primary EP.   Continue Toprol 25 mg daily  2. Secondary Hypercoagulable State (ICD10:  D68.69) The patient is at significant risk for stroke/thromboembolism based upon his CHA2DS2-VASc Score of 4.  Continue Apixaban (Eliquis).   Emphasis on adherence due to post ablation period. Clarification provided on patient requiring anticoagulation long term and that future discontinuation of medication would likely be amiodarone.   3. HTN Stable today; continue current regimen.   4. HFrEF EF 20-25% Fluid status appears euvolemic today;  continue regimen.    Follow up with Dr. Quentin Ore as scheduled.   [facility] [address] [place], [place] [address] [phone] 06/21/2022 1:24 PM

## 2022-06-28 ENCOUNTER — Ambulatory Visit: Payer: Medicare HMO | Attending: Medical | Admitting: Medical

## 2022-06-28 NOTE — Progress Notes (Deleted)
Cardiology Office Note:    Date:  06/28/2022   ID:  Jerry Howe, DOB [birth year], MRN [id number]  PCP:  Gladstone Lighter, MD  [facility] Cardiologist:  Ida Rogue, MD  [facility] Electrophysiologist:  Vickie Epley, MD   Referring MD: Gladstone Lighter, MD   Chief Complaint: ***  History of Present Illness:    Jerry Howe is a 67 y.o. male with a hx of systolic heart failure due to NICM, PAF, HTN who is being seen for follow-up.    The patient was diagnosed with heart failure in 2016.  Left heart cath at that time showed minimal CAD.  LVEF was 25%.  He was followed by [facility] clinic and later transferred to heart care.  He established with Dr. Rockey Situ in September 2023 and Dr. Quentin Ore in October 2023.  Patient was switched from lisinopril to [facility].  He is on spironolactone and metoprolol.  Echo from October 2023 showed LVEF 20 to 25%.   The patient was admitted in October 2023 after his defibrillator fired 6 times after getting in a heated discussion.  In the ED he was noted to be in A-fib, he converted on his own.  He was seen by Dr. Caryl Comes and ICD interrogation showed inappropriate ICD firing for A-fib.  Patient was started on amiodarone.  He was also found to have frequent PVCs.  Patient underwent cath procedure 02/10/2022 showing 10 to 20% proximal LAD disease, normal RCA and left circumflex.  It showed elevated filling pressures, severe pulmonary venous hypertension and moderately reduced CO in the setting of restrictive physiology.   Patient started following with the advanced heart failure clinic in November 2023.  Patient was on Entresto, spiral, Toprol, Jardiance.  Plan for CPX testing to rule stratify for advanced therapies.  May need eventual repeat right heart cath and milrinone.  Patient was in normal sinus rhythm.   Patient was seen by Dr. Quentin Ore 03/17/2022 on amiodarone and Eliquis.  They discussed ablation, patient wanted to proceed and was  subsequently scheduled.  Patient underwent A-fib ablation 05/24/2022.  TEE at that time showed EF of 2025% with mild to moderate MR.  Patient saw advanced heart failure team June 07, 2022.  No changes were made.  Vice interrogation showed no VT or AF.  Today,    Past Medical History:  Diagnosis Date   Anxiety    Arrhythmia    CHF (congestive heart failure) ([place])    Hypertension    Kidney stones    Shortness of breath dyspnea     Past Surgical History:  Procedure Laterality Date   ATRIAL FIBRILLATION ABLATION N/A 05/24/2022   Procedure: ATRIAL FIBRILLATION ABLATION;  Surgeon: Vickie Epley, MD;  Location: [place] [facility];  Service: Cardiovascular;  Laterality: N/A;   CARDIAC CATHETERIZATION Right 11/20/2014   Procedure: Left Heart Cath and Coronary Angiography;  Surgeon: Isaias Cowman, MD;  Location: [place] [facility];  Service: Cardiovascular;  Laterality: Right;   IMPLANTABLE CARDIOVERTER DEFIBRILLATOR (ICD) GENERATOR CHANGE Left 08/15/2015   Procedure: ICD IMPLANT, single chamber;  Surgeon: Marzetta Board, MD;  Location: [facility];  Service: Cardiovascular;  Laterality: Left;   KNEE SURGERY     RIGHT/LEFT HEART CATH AND CORONARY ANGIOGRAPHY N/A 02/10/2022   Procedure: RIGHT/LEFT HEART CATH AND CORONARY ANGIOGRAPHY;  Surgeon: Nelva Bush, MD;  Location: [place] [facility];  Service: Cardiovascular;  Laterality: N/A;   TEE WITHOUT CARDIOVERSION N/A 05/24/2022   Procedure: TRANSESOPHAGEAL ECHOCARDIOGRAM;  Surgeon: Lars Mage  T, MD;  Location: [place] [facility];  Service: Cardiovascular;  Laterality: N/A;    Current Medications: No outpatient medications have been marked as taking for the 06/28/22 encounter (Appointment) with Kathlen Mody, Gelene Recktenwald H, PA-C.     Allergies:   Shellfish allergy and Rosuvastatin   Social History   Socioeconomic History   Marital status: Married    Spouse name: Joelene Millin   Number of children: 5   Years of education: Not on file    Highest education level: Not on file  Occupational History   Not on file  Tobacco Use   Smoking status: Never   Smokeless tobacco: Never   Tobacco comments:    Never smoke 06/21/22  Vaping Use   Vaping Use: Never used  Substance and Sexual Activity   Alcohol use: No   Drug use: Not Currently   Sexual activity: Yes    Partners: Female  Other Topics Concern   Not on file  Social History Narrative   Lives at home with spouse; 5 kids, 60 grandkids & 2 great grandkids   Social Determinants of Health   Financial Resource Strain: Not on file  Food Insecurity: No Food Insecurity (02/08/2022)   Hunger Vital Sign    Worried About Running Out of Food in the Last Year: Never true    Ran Out of Food in the Last Year: Never true  Transportation Needs: No Transportation Needs (02/08/2022)   PRAPARE - Hydrologist (Medical): No    Lack of Transportation (Non-Medical): No  Physical Activity: Not on file  Stress: Not on file  Social Connections: Not on file     Family History: The patient's ***family history includes Diabetes in his brother; Heart disease in his father; Hyperlipidemia in his brother and mother; Hypertension in his father. There is no history of Heart failure.  ROS:   Please see the history of present illness.    *** All other systems reviewed and are negative.  EKGs/Labs/Other Studies Reviewed:    The following studies were reviewed today: ***  EKG:  EKG is *** ordered today.  The ekg ordered today demonstrates ***  Recent Labs: 02/07/2022: ALT 15; TSH 0.835 02/08/2022: Magnesium 2.0 03/29/2022: B Natriuretic Peptide 332.7 05/24/2022: BUN 17; Creatinine, Ser 1.57; Hemoglobin 13.7; Platelets 165; Potassium 3.9; Sodium 135  Recent Lipid Panel    Component Value Date/Time   CHOL 184 11/19/2014 0443   TRIG 188 (H) 11/19/2014 0443   HDL 40 (L) 11/19/2014 0443   CHOLHDL 4.6 11/19/2014 0443   VLDL 38 11/19/2014 0443   LDLCALC 106 (H)  11/19/2014 0443     Risk Assessment/Calculations:   {Does this patient have ATRIAL FIBRILLATION?[phone]   Physical Exam:    VS:  There were no vitals taken for this visit.    Wt Readings from Last 3 Encounters:  06/21/22 202 lb 3.2 oz (91.7 kg)  06/07/22 211 lb 2 oz (95.8 kg)  05/24/22 208 lb (94.3 kg)     GEN: *** Well nourished, well developed in no acute distress HEENT: Normal NECK: No JVD; No carotid bruits LYMPHATICS: No lymphadenopathy CARDIAC: ***RRR, no murmurs, rubs, gallops RESPIRATORY:  Clear to auscultation without rales, wheezing or rhonchi  ABDOMEN: Soft, non-tender, non-distended MUSCULOSKELETAL:  No edema; No deformity  SKIN: Warm and dry NEUROLOGIC:  Alert and oriented x 3 PSYCHIATRIC:  Normal affect   ASSESSMENT:    No diagnosis found. PLAN:    In order of problems listed above:  ***  Disposition: Follow up {follow up:15908} with ***   Shared Decision Making/Informed Consent   {Are you ordering a CV Procedure (e.g. stress test, cath, DCCV, TEE, etc)?   Press F2        :[id number]    Signed, Latriece Anstine Arlyss Repress  06/28/2022 9:42 AM    [facility]

## 2022-06-29 ENCOUNTER — Encounter: Payer: Self-pay | Admitting: Medical

## 2022-06-30 ENCOUNTER — Other Ambulatory Visit (HOSPITAL_COMMUNITY): Payer: Self-pay

## 2022-06-30 MED ORDER — APIXABAN 5 MG PO TABS: 5.0000 mg | ORAL_TABLET | Freq: Two times a day (BID) | ORAL | 0 refills | Status: DC

## 2022-07-05 ENCOUNTER — Ambulatory Visit (INDEPENDENT_AMBULATORY_CARE_PROVIDER_SITE_OTHER): Payer: Medicare HMO

## 2022-07-05 DIAGNOSIS — I480 Paroxysmal atrial fibrillation: Secondary | ICD-10-CM

## 2022-07-05 LAB — CUP PACEART REMOTE DEVICE CHECK
Battery Remaining Longevity: 59 mo
Battery Voltage: 2.98 V
Brady Statistic RV Percent Paced: 0.06 %
Date Time Interrogation Session: 20240318160821
HighPow Impedance: 61 Ohm
Implantable Lead Connection Status: 753985
Implantable Lead Implant Date: 20170428
Implantable Lead Location: 753862
Implantable Pulse Generator Implant Date: 20170428
Lead Channel Impedance Value: 304 Ohm
Lead Channel Impedance Value: 342 Ohm
Lead Channel Pacing Threshold Amplitude: 1 V
Lead Channel Pacing Threshold Pulse Width: 0.4 ms
Lead Channel Sensing Intrinsic Amplitude: 11.75 mV
Lead Channel Sensing Intrinsic Amplitude: 11.75 mV
Lead Channel Setting Pacing Amplitude: 2 V
Lead Channel Setting Pacing Pulse Width: 0.4 ms
Lead Channel Setting Sensing Sensitivity: 0.3 mV
Zone Setting Status: 755011
Zone Setting Status: 755011

## 2022-07-20 DIAGNOSIS — H524 Presbyopia: Secondary | ICD-10-CM | POA: Diagnosis not present

## 2022-07-20 DIAGNOSIS — H5203 Hypermetropia, bilateral: Secondary | ICD-10-CM | POA: Diagnosis not present

## 2022-07-20 DIAGNOSIS — H259 Unspecified age-related cataract: Secondary | ICD-10-CM | POA: Diagnosis not present

## 2022-07-20 DIAGNOSIS — H52223 Regular astigmatism, bilateral: Secondary | ICD-10-CM | POA: Diagnosis not present

## 2022-08-11 DIAGNOSIS — H524 Presbyopia: Secondary | ICD-10-CM | POA: Diagnosis not present

## 2022-08-11 NOTE — Progress Notes (Signed)
Remote ICD transmission.   
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
## 2022-08-30 ENCOUNTER — Encounter: Payer: Medicare HMO | Admitting: Internal Medicine

## 2022-09-07 NOTE — Progress Notes (Unsigned)
  Electrophysiology Office Follow up Visit Note:    Date:  09/08/2022   ID:  Jerry Howe, DOB 1955/05/21, MRN [id number]  PCP:  Enid Baas, MD  [facility] Cardiologist:  Julien Nordmann, MD  [facility] Electrophysiologist:  Lanier Prude, MD    Interval History:    [name] is a 67 y.o. male who presents for a follow up visit.   He had an AF ablation 05/24/2022. During the procedure, the veins and posterior wall were isolated. He saw [facility] 06/21/2022. He has been taking amiodarone.   He has been doing really well since I last saw him.  No recurrence of atrial fibrillation.  No shocks from his defibrillator.  Continues to take amiodarone 200 mg mouth once daily in addition to Eliquis 5 mg by mouth twice daily.  He is lost about 30 pounds and is working on eating a cleaner diet and staying active.  He is very pleased with his progress.      Past medical, surgical, social and family history were reviewed.  ROS:   Please see the history of present illness.    All other systems reviewed and are negative.  EKGs/Labs/Other Studies Reviewed:    The following studies were reviewed today:  Sep 08, 2022 in clinic device interrogation personally reviewed Longevity 4.5 years Lead parameter stable No high-voltage therapies No atrial fibrillation OptiVol okay   Physical Exam:    VS:  BP 120/74 (BP Location: Left Arm, Patient Position: Sitting, Cuff Size: Large)   Pulse 64   Ht 5\' 9"  (1.753 m)   Wt 206 lb 8 oz (93.7 kg)   SpO2 98%   BMI 30.49 kg/m     Wt Readings from Last 3 Encounters:  09/08/22 206 lb 8 oz (93.7 kg)  06/21/22 202 lb 3.2 oz (91.7 kg)  06/07/22 211 lb 2 oz (95.8 kg)     GEN:  Well nourished, well developed in no acute distress CARDIAC: RRR, no murmurs, rubs, gallops.  ICD pocket well-healed RESPIRATORY:  Clear to auscultation without rales, wheezing or rhonchi       ASSESSMENT:    1. Paroxysmal atrial fibrillation (HCC)   2.  Chronic systolic heart failure (HCC)   3. Encounter for long-term (current) use of high-risk medication    PLAN:    In order of problems listed above:  #Persistent AF #High risk drug monitoring - amiodarone Maintaining sinus rhythm after his 05/2022 ablation. On eliquis for stroke ppx.  Stop amiodarone today.  #Chronic systolic heart failure NYHA II today. Warm and dry on exam. Continue jardiance, metoprolol, entresto, spironolactone and torsemide.    Follow up 6 months with APP.   Signed, Steffanie Dunn, MD, [facility], [facility] 09/08/2022 1:53 PM    Electrophysiology [facility]

## 2022-09-08 ENCOUNTER — Ambulatory Visit: Payer: Medicare HMO | Attending: Cardiology | Admitting: Cardiology

## 2022-09-08 ENCOUNTER — Encounter: Payer: Self-pay | Admitting: Cardiology

## 2022-09-08 VITALS — BP 120/74 | HR 64 | Ht 69.0 in | Wt 206.5 lb

## 2022-09-08 DIAGNOSIS — I5022 Chronic systolic (congestive) heart failure: Secondary | ICD-10-CM

## 2022-09-08 DIAGNOSIS — Z79899 Other long term (current) drug therapy: Secondary | ICD-10-CM

## 2022-09-08 DIAGNOSIS — I480 Paroxysmal atrial fibrillation: Secondary | ICD-10-CM

## 2022-09-08 LAB — CUP PACEART INCLINIC DEVICE CHECK
Battery Remaining Longevity: 54 mo
Battery Voltage: 2.98 V
Brady Statistic RV Percent Paced: 0.23 %
Date Time Interrogation Session: 20240522121455
HighPow Impedance: 62 Ohm
Implantable Lead Connection Status: 753985
Implantable Lead Implant Date: 20170428
Implantable Lead Location: 753862
Implantable Pulse Generator Implant Date: 20170428
Lead Channel Impedance Value: 323 Ohm
Lead Channel Impedance Value: 380 Ohm
Lead Channel Pacing Threshold Amplitude: 0.875 V
Lead Channel Pacing Threshold Pulse Width: 0.4 ms
Lead Channel Sensing Intrinsic Amplitude: 12.625 mV
Lead Channel Sensing Intrinsic Amplitude: 16 mV
Lead Channel Setting Pacing Amplitude: 2 V
Lead Channel Setting Pacing Pulse Width: 0.4 ms
Lead Channel Setting Sensing Sensitivity: 0.3 mV
Zone Setting Status: 755011
Zone Setting Status: 755011

## 2022-09-08 MED ORDER — APIXABAN 5 MG PO TABS: 5.0000 mg | ORAL_TABLET | Freq: Two times a day (BID) | ORAL | 2 refills | Status: DC

## 2022-09-08 MED ORDER — TORSEMIDE 20 MG PO TABS: 20.0000 mg | ORAL_TABLET | Freq: Every day | ORAL | 3 refills | Status: DC | PRN

## 2022-09-08 MED ORDER — SACUBITRIL-VALSARTAN 24-26 MG PO TABS: ORAL_TABLET | ORAL | 2 refills | Status: DC

## 2022-09-08 NOTE — Patient Instructions (Signed)
Medication Instructions:  Your physician has recommended you make the following change in your medication:  1) STOP taking amiodarone  *If you need a refill on your cardiac medications before your next appointment, please call your pharmacy*  Follow-Up: At [facility], you and your health needs are our priority.  As part of our continuing mission to provide you with exceptional heart care, we have created designated Provider Care Teams.  These Care Teams include your primary Cardiologist (physician) and Advanced Practice Providers (APPs -  Physician Assistants and Nurse Practitioners) who all work together to provide you with the care you need, when you need it.  Your next appointment:   6 month(s)  Provider:   Sherie Don, NP

## 2022-09-23 ENCOUNTER — Other Ambulatory Visit: Payer: Self-pay | Admitting: Physician Assistant

## 2022-09-23 NOTE — Telephone Encounter (Signed)
RX request for Pantoprazole, okay to send?

## 2022-09-27 ENCOUNTER — Other Ambulatory Visit (HOSPITAL_COMMUNITY): Payer: Self-pay

## 2022-10-04 ENCOUNTER — Ambulatory Visit (INDEPENDENT_AMBULATORY_CARE_PROVIDER_SITE_OTHER): Payer: Medicare HMO

## 2022-10-04 DIAGNOSIS — I480 Paroxysmal atrial fibrillation: Secondary | ICD-10-CM

## 2022-10-05 LAB — CUP PACEART REMOTE DEVICE CHECK
Battery Remaining Longevity: 54 mo
Battery Voltage: 2.96 V
Brady Statistic RV Percent Paced: 1.26 %
Date Time Interrogation Session: 20240618022611
HighPow Impedance: 61 Ohm
Implantable Lead Connection Status: 753985
Implantable Lead Implant Date: 20170428
Implantable Lead Location: 753862
Implantable Pulse Generator Implant Date: 20170428
Lead Channel Impedance Value: 304 Ohm
Lead Channel Impedance Value: 380 Ohm
Lead Channel Pacing Threshold Amplitude: 1 V
Lead Channel Pacing Threshold Pulse Width: 0.4 ms
Lead Channel Sensing Intrinsic Amplitude: 11.375 mV
Lead Channel Sensing Intrinsic Amplitude: 11.375 mV
Lead Channel Setting Pacing Amplitude: 2 V
Lead Channel Setting Pacing Pulse Width: 0.4 ms
Lead Channel Setting Sensing Sensitivity: 0.3 mV
Zone Setting Status: 755011
Zone Setting Status: 755011

## 2022-10-22 NOTE — Progress Notes (Signed)
Remote ICD transmission.   

## 2022-11-01 ENCOUNTER — Encounter: Payer: Medicare HMO | Admitting: Internal Medicine

## 2022-12-15 ENCOUNTER — Ambulatory Visit: Payer: Medicare HMO | Admitting: Internal Medicine

## 2022-12-15 ENCOUNTER — Telehealth: Payer: Self-pay | Admitting: Pharmacist

## 2022-12-15 ENCOUNTER — Telehealth (HOSPITAL_COMMUNITY): Payer: Self-pay

## 2022-12-15 ENCOUNTER — Other Ambulatory Visit (HOSPITAL_COMMUNITY): Payer: Self-pay

## 2022-12-15 ENCOUNTER — Other Ambulatory Visit: Payer: Self-pay

## 2022-12-15 VITALS — BP 120/58 | HR 70 | Ht 68.0 in | Wt 203.0 lb

## 2022-12-15 DIAGNOSIS — I130 Hypertensive heart and chronic kidney disease with heart failure and stage 1 through stage 4 chronic kidney disease, or unspecified chronic kidney disease: Secondary | ICD-10-CM | POA: Diagnosis not present

## 2022-12-15 DIAGNOSIS — I428 Other cardiomyopathies: Secondary | ICD-10-CM | POA: Insufficient documentation

## 2022-12-15 DIAGNOSIS — Z79899 Other long term (current) drug therapy: Secondary | ICD-10-CM

## 2022-12-15 DIAGNOSIS — N183 Chronic kidney disease, stage 3 unspecified: Secondary | ICD-10-CM

## 2022-12-15 DIAGNOSIS — I2510 Atherosclerotic heart disease of native coronary artery without angina pectoris: Secondary | ICD-10-CM | POA: Diagnosis not present

## 2022-12-15 DIAGNOSIS — I5022 Chronic systolic (congestive) heart failure: Secondary | ICD-10-CM | POA: Diagnosis present

## 2022-12-15 DIAGNOSIS — I493 Ventricular premature depolarization: Secondary | ICD-10-CM

## 2022-12-15 DIAGNOSIS — Z7901 Long term (current) use of anticoagulants: Secondary | ICD-10-CM | POA: Insufficient documentation

## 2022-12-15 DIAGNOSIS — I272 Pulmonary hypertension, unspecified: Secondary | ICD-10-CM | POA: Insufficient documentation

## 2022-12-15 DIAGNOSIS — E859 Amyloidosis, unspecified: Secondary | ICD-10-CM

## 2022-12-15 DIAGNOSIS — N1831 Chronic kidney disease, stage 3a: Secondary | ICD-10-CM | POA: Insufficient documentation

## 2022-12-15 DIAGNOSIS — Z7984 Long term (current) use of oral hypoglycemic drugs: Secondary | ICD-10-CM | POA: Insufficient documentation

## 2022-12-15 DIAGNOSIS — I480 Paroxysmal atrial fibrillation: Secondary | ICD-10-CM | POA: Diagnosis not present

## 2022-12-15 DIAGNOSIS — Z95810 Presence of automatic (implantable) cardiac defibrillator: Secondary | ICD-10-CM | POA: Diagnosis not present

## 2022-12-15 MED ORDER — ENTRESTO 49-51 MG PO TABS
1.0000 | ORAL_TABLET | Freq: Two times a day (BID) | ORAL | 3 refills | Status: DC
  Filled 2022-12-15: qty 60, 30d supply, fill #0

## 2022-12-15 MED ORDER — TORSEMIDE 20 MG PO TABS: 20.0000 mg | ORAL_TABLET | Freq: Every day | ORAL | 3 refills | Status: DC | PRN

## 2022-12-15 MED ORDER — SACUBITRIL-VALSARTAN 24-26 MG PO TABS: ORAL_TABLET | ORAL | 2 refills | Status: DC

## 2022-12-15 MED ORDER — APIXABAN 5 MG PO TABS: 5.0000 mg | ORAL_TABLET | Freq: Two times a day (BID) | ORAL | 3 refills | Status: DC

## 2022-12-15 MED ORDER — SPIRONOLACTONE 25 MG PO TABS: 25.0000 mg | ORAL_TABLET | Freq: Every day | ORAL | 3 refills | Status: DC

## 2022-12-15 NOTE — Patient Instructions (Signed)
Medication Changes:  Increase Entresto 49/51 mg (1 tablet) two times daily.   Lab Work:  Labs done today, your results will be available in MyChart, we will contact you for abnormal readings.   Genetic test has been done, this has to be sent to [place] to be processed and can take 1-2 weeks to get results back.  We will let you know the results.   Testing/Procedures:  Your physician has requested that you have an echocardiogram. Echocardiography is a painless test that uses sound waves to create images of your heart. It provides your doctor with information about the size and shape of your heart and how well your heart's chambers and valves are working. This procedure takes approximately one hour. There are no restrictions for this procedure. Please do NOT wear cologne, perfume, aftershave, or lotions (deodorant is allowed). Please arrive 15 minutes prior to your appointment time.   Echo 11/22 Arrive 9:45a    Special Instructions // Education:  Do the following things EVERYDAY: Weigh yourself in the morning before breakfast. Write it down and keep it in a log. Take your medicines as prescribed Eat low salt foods--Limit salt (sodium) to 2000 mg per day.  Stay as active as you can everyday Limit all fluids for the day to less than 2 liters   Follow-Up in: in 4 months with Dr. Gala Romney. Please call on November to schedule this appointment.     If you have any questions or concerns before your next appointment please send [place] a message through [place] or call our office at [phone] Monday-Friday 8 am-5 pm.   If you have an urgent need after hours on the weekend please call your Primary Cardiologist or the Advanced Heart Failure Clinic in [place] at [phone].
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
## 2022-12-15 NOTE — Telephone Encounter (Signed)
Approved for [employer]  RX BIN: [id number]  RX PCN: [id number]  RX GROUP: [id number]  RX ID: [id number]  Effective 11/15/2022 to 11/14/2023

## 2022-12-15 NOTE — Telephone Encounter (Signed)
Advanced Heart Failure Patient Advocate Encounter  The patient was approved for a [employer] grant that will help cover the cost of Entresto, Jardiance, Metoprolol, Spironolactone.  Total amount awarded, $10,000.  Effective: 11/15/2022 - 11/14/2023.  BIN [id number] PCN [id number] Group [id number] ID [id number]  Patient provided with approval and processing information in office.  Burnell Blanks, CPhT Rx Patient Advocate Phone: [phone]

## 2022-12-15 NOTE — Telephone Encounter (Signed)
Duplicate encounter

## 2022-12-16 LAB — BASIC METABOLIC PANEL
BUN/Creatinine Ratio: 11 (ref 10–24)
BUN: 16 mg/dL (ref 8–27)
CO2: 20 mmol/L (ref 20–29)
Calcium: 9.4 mg/dL (ref 8.6–10.2)
Chloride: 97 mmol/L (ref 96–106)
Creatinine, Ser: 1.45 mg/dL — ABNORMAL HIGH (ref 0.76–1.27)
Glucose: 85 mg/dL (ref 70–99)
Potassium: 4.8 mmol/L (ref 3.5–5.2)
Sodium: 135 mmol/L (ref 134–144)
eGFR: 53 mL/min/{1.73_m2} — ABNORMAL LOW (ref >59)

## 2022-12-16 LAB — BRAIN NATRIURETIC PEPTIDE: BNP: 159.9 pg/mL — ABNORMAL HIGH (ref 0.0–100.0)

## 2023-01-03 ENCOUNTER — Ambulatory Visit (INDEPENDENT_AMBULATORY_CARE_PROVIDER_SITE_OTHER): Payer: Medicare HMO

## 2023-01-03 DIAGNOSIS — I480 Paroxysmal atrial fibrillation: Secondary | ICD-10-CM | POA: Diagnosis not present

## 2023-01-03 DIAGNOSIS — I5022 Chronic systolic (congestive) heart failure: Secondary | ICD-10-CM

## 2023-01-04 LAB — CUP PACEART REMOTE DEVICE CHECK
Battery Remaining Longevity: 48 mo
Battery Voltage: 2.98 V
Brady Statistic RV Percent Paced: 0.65 %
Date Time Interrogation Session: 20240917022603
HighPow Impedance: 65 Ohm
Implantable Lead Connection Status: 753985
Implantable Lead Implant Date: 20170428
Implantable Lead Location: 753862
Implantable Pulse Generator Implant Date: 20170428
Lead Channel Impedance Value: 304 Ohm
Lead Channel Impedance Value: 342 Ohm
Lead Channel Pacing Threshold Amplitude: 0.875 V
Lead Channel Pacing Threshold Pulse Width: 0.4 ms
Lead Channel Sensing Intrinsic Amplitude: 12.5 mV
Lead Channel Sensing Intrinsic Amplitude: 12.5 mV
Lead Channel Setting Pacing Amplitude: 2 V
Lead Channel Setting Pacing Pulse Width: 0.4 ms
Lead Channel Setting Sensing Sensitivity: 0.3 mV
Zone Setting Status: 755011
Zone Setting Status: 755011

## 2023-01-05 ENCOUNTER — Telehealth (HOSPITAL_COMMUNITY): Payer: Self-pay | Admitting: *Deleted

## 2023-01-17 NOTE — Progress Notes (Signed)
Remote ICD transmission.   

## 2023-01-26 DIAGNOSIS — R7303 Prediabetes: Secondary | ICD-10-CM | POA: Insufficient documentation

## 2023-03-11 ENCOUNTER — Ambulatory Visit: Admission: RE | Admit: 2023-03-11 | Payer: Medicare HMO | Source: Ambulatory Visit

## 2023-03-20 NOTE — Progress Notes (Signed)
ADVANCED HF CLINIC NOTE   PCP: Enid Baas, MD Primary Cardiologist: Julien Nordmann, MD   HPI  Jerry Howe is a 67 y/o male with systolic HF due to NICM, PAF, HTN he is being seen today for evaluation of heart failure at the request of Dr. Delfino Lovett   Jerry Howe was diagnosed with heart failure in 2016.  LHC at that time revealed minimal CAD.  LVEF was 25%.  He was followed by [facility] clinic and recently transferred to [facility].  He established with Dr. Mariah Milling on 12/2021 and Dr. Lalla Brothers on 01/2022.  He reported NYHA Class II symptoms and lisinopril switch to Entresto.  He remained on spironolactone and metoprolol.  He established with Dr. Lalla Brothers on 01/2022 he was stable and doing well.  He has no history of atrial fibrillation.   Echo 3/20 EF 25-30% LVIDD 7.6 Echo 02/09/22  EF 20-25% RV ok  LVIDD 7.5   At baseline NYHA III. Says he stopped lasix earlier this year and got worse but restarted 2 months ago and was a bit better.   He presented to the ED on 10/22 after his defibrillator fired 6 times at [employer] after he got into a heated discussion.  In the ED he was noted to be in atrial fibrillation.  He converted on his own. Seen by Dr. Graciela Husbands and ICD interogation showed inappropriate ICD firing for AF. Started on amio. Also found to have frequent PVCs.    Cath 02/10/22  LAD 10-20%prox. RCA and LCX ok  RA 14 RV 90/13 PA 90/30 (50) PCWP 30 with v-waves 45 Ao sat 96% PA sat 67% Fick 4.7/2.2 PVR 4.3 WU    CPX 03/22/22  Resting HR: 67 Standing HR: 67 Peak HR: 123   (80% age predicted max HR)  BP rest: 118/82 BP standing: 118/78 BP peak: 194/82  Peak VO2: 13.9 (47.9% predicted peak VO2)  VE/VCO2 slope:  36.9  OUES: 1.58  Peak RER: 0.96  VE/MVV:  43.4%  O2pulse:  49mL/beat   (69% predicted O2pulse)   Had AF ablation on 05/24/22. TEE at that time EF 20-25% Mild to moderate MR  At last visit Entresto increased to 49/51. Returns today for f/u. Feels great. Working PT.  Denies CP,SOB, orthopnea or PND. No ICD firings. Compliant with meds.    ROS: All systems negative except as listed in HPI, PMH and Problem List.  SH:  Social History   Socioeconomic History   Marital status: Married    Spouse name: Cala Bradford   Number of children: 5   Years of education: Not on file   Highest education level: Not on file  Occupational History   Not on file  Tobacco Use   Smoking status: Never   Smokeless tobacco: Never   Tobacco comments:    Never smoke 06/21/22  Vaping Use   Vaping status: Never Used  Substance and Sexual Activity   Alcohol use: No   Drug use: Not Currently   Sexual activity: Yes    Partners: Female  Other Topics Concern   Not on file  Social History Narrative   Lives at home with spouse; 5 kids, 11 grandkids & 2 great grandkids   Social Determinants of Health   Financial Resource Strain: Not on file  Food Insecurity: No Food Insecurity (02/08/2022)   Hunger Vital Sign    Worried About Running Out of Food in the Last Year: Never true    Ran Out of Food in the  Last Year: Never true  Transportation Needs: No Transportation Needs (02/08/2022)   PRAPARE - Administrator, Civil Service (Medical): No    Lack of Transportation (Non-Medical): No  Physical Activity: Not on file  Stress: Not on file  Social Connections: Not on file  Intimate Partner Violence: Not At Risk (02/08/2022)   Humiliation, Afraid, Rape, and Kick questionnaire    Fear of Current or Ex-Partner: No    Emotionally Abused: No    Physically Abused: No    Sexually Abused: No    FH:  Family History  Problem Relation Age of Onset   Hyperlipidemia Mother    Hypertension Father    Heart disease Father    Hyperlipidemia Brother    Diabetes Brother    Heart failure Neg Hx     Past Medical History:  Diagnosis Date   Anxiety    Arrhythmia    CHF (congestive heart failure) (HCC)    Hypertension    Kidney stones    Shortness of breath dyspnea      Current Outpatient Medications  Medication Sig Dispense Refill   albuterol (PROVENTIL HFA;VENTOLIN HFA) 108 (90 Base) MCG/ACT inhaler Inhale 1 puff into the lungs every 6 (six) hours as needed for wheezing or shortness of breath.     apixaban (ELIQUIS) 5 MG TABS tablet Take 1 tablet (5 mg total) by mouth 2 (two) times daily. 180 tablet 3   Ascorbic Acid (VITAMIN C) 1000 MG tablet Take 1,000 mg by mouth daily.     cyanocobalamin 2000 MCG tablet Take 2,000 mcg by mouth daily.     empagliflozin (JARDIANCE) 10 MG TABS tablet Take 1 tablet (10 mg total) by mouth daily before breakfast. 30 tablet 6   Magnesium 200 MG TABS Take 200 mg by mouth daily.     metoprolol succinate (TOPROL-XL) 25 MG 24 hr tablet Take 25 mg by mouth daily.     Multiple Vitamins-Minerals (SUPER THERA VITE M PO) Take 1 tablet by mouth daily.     Naphazoline-Glycerin (REDNESS RELIEF OP) Place 1 drop into both eyes 2 (two) times daily.     pantoprazole (PROTONIX) 40 MG tablet Take 1 tablet (40 mg total) by mouth daily. 45 tablet 0   pyridoxine (B-6) 500 MG tablet Take 500 mg by mouth daily.     sacubitril-valsartan (ENTRESTO) 49-51 MG Take 1 tablet by mouth 2 (two) times daily. 180 tablet 3   spironolactone (ALDACTONE) 25 MG tablet Take 1 tablet (25 mg total) by mouth daily. 90 tablet 3   torsemide (DEMADEX) 20 MG tablet Take 1 tablet (20 mg total) by mouth daily as needed. 90 tablet 3   No current facility-administered medications for this visit.    There were no vitals filed for this visit.   PHYSICAL EXAM: General:  Well appearing. No resp difficulty HEENT: normal Neck: supple. no JVD. Carotids 2+ bilat; no bruits. No lymphadenopathy or thryomegaly appreciated. Cor: PMI nondisplaced. Regular rate & rhythm. No rubs, gallops or murmurs. Lungs: clear Abdomen: soft, nontender, nondistended. No hepatosplenomegaly. No bruits or masses. Good bowel sounds. Extremities: no cyanosis, clubbing, rash, edema Neuro: alert &  orientedx3, cranial nerves grossly intact. moves all 4 extremities w/o difficulty. Affect pleasant    ASSESSMENT & PLAN:  1. Chronic systolic HF - due to NICM. Unclear etiology. Has not had cMRI due to ICD. - Echo 2016 EF 25% - Echo 3/20 EF 25-30% LVIDD 7.6 - Echo 02/09/22  EF 20-25% RV ok  LVIDD  7.5 G3DD - s/p MDT ICD - Cath 02/10/22 with minimal CAD (LAD 10-20%) markedly elevated filling pressures, severe pulmonary venous HTN and moderately reduced CO in setting of restrictive physiology - CPX 03/22/22 pRER 0.96  pVO2: 13.9 (47.9%) VE/VCO2 slope:  36.9 OUES: 1.58  VE/MVV:  43.4%  - TEE 05/24/21 EF 25-30% RV moderately decreased - Doing well NYHA I despite persistent LV dysfunction - Volume status ok on torsemide 20  prn - Continue Entresto  49/51 bid - Continue Spiro 25 - Continue Toprol 25 - Continue Jardiance 10 - ICD interrogation: No VT/AF  Volume ok Activity 4-6hr/day Personally reviewed - CPX testing was submax test with moderate HF limitation. Doing well. Not ready for advanced therapies at this time. Follow closely - Will send off genetic testing - Repeat echo - Consider MRI  2. PAF - now s/p ICD inappropriate shock x 6 in 10/23 - off amio - s/p AF ablation 05/24/22 - in NSR today - continue Eliquis. - ICD interrogation as above   3. Frequent PVCs  - suppressed on amio  - now off amio. - follow   4. CKD 3a-3b  - baseline SCr 1.3-1.8 - Recent Scr 1.5 - Continue Jardiance   Arvilla Meres, MD  10:34 PM

## 2023-03-21 ENCOUNTER — Ambulatory Visit (HOSPITAL_BASED_OUTPATIENT_CLINIC_OR_DEPARTMENT_OTHER): Payer: Medicare HMO | Admitting: Internal Medicine

## 2023-03-21 ENCOUNTER — Encounter: Payer: Self-pay | Admitting: Internal Medicine

## 2023-03-21 ENCOUNTER — Other Ambulatory Visit
Admission: RE | Admit: 2023-03-21 | Discharge: 2023-03-21 | Disposition: A | Discharge status: Home / Self Care | Payer: Medicare HMO | Source: Ambulatory Visit | Attending: Internal Medicine | Admitting: Internal Medicine

## 2023-03-21 VITALS — BP 122/88 | HR 77 | Wt 207.0 lb

## 2023-03-21 DIAGNOSIS — I5022 Chronic systolic (congestive) heart failure: Secondary | ICD-10-CM | POA: Insufficient documentation

## 2023-03-21 DIAGNOSIS — R0602 Shortness of breath: Secondary | ICD-10-CM | POA: Diagnosis not present

## 2023-03-21 DIAGNOSIS — I272 Pulmonary hypertension, unspecified: Secondary | ICD-10-CM | POA: Insufficient documentation

## 2023-03-21 DIAGNOSIS — I480 Paroxysmal atrial fibrillation: Secondary | ICD-10-CM | POA: Insufficient documentation

## 2023-03-21 DIAGNOSIS — Z79899 Other long term (current) drug therapy: Secondary | ICD-10-CM | POA: Insufficient documentation

## 2023-03-21 DIAGNOSIS — N183 Chronic kidney disease, stage 3 unspecified: Secondary | ICD-10-CM

## 2023-03-21 DIAGNOSIS — I130 Hypertensive heart and chronic kidney disease with heart failure and stage 1 through stage 4 chronic kidney disease, or unspecified chronic kidney disease: Secondary | ICD-10-CM | POA: Diagnosis not present

## 2023-03-21 DIAGNOSIS — I428 Other cardiomyopathies: Secondary | ICD-10-CM | POA: Insufficient documentation

## 2023-03-21 DIAGNOSIS — Z95810 Presence of automatic (implantable) cardiac defibrillator: Secondary | ICD-10-CM

## 2023-03-21 DIAGNOSIS — I493 Ventricular premature depolarization: Secondary | ICD-10-CM

## 2023-03-21 DIAGNOSIS — N1831 Chronic kidney disease, stage 3a: Secondary | ICD-10-CM | POA: Diagnosis not present

## 2023-03-21 DIAGNOSIS — I2510 Atherosclerotic heart disease of native coronary artery without angina pectoris: Secondary | ICD-10-CM | POA: Insufficient documentation

## 2023-03-21 DIAGNOSIS — Z7984 Long term (current) use of oral hypoglycemic drugs: Secondary | ICD-10-CM | POA: Insufficient documentation

## 2023-03-21 DIAGNOSIS — Z7901 Long term (current) use of anticoagulants: Secondary | ICD-10-CM | POA: Insufficient documentation

## 2023-03-21 LAB — BASIC METABOLIC PANEL
Anion gap: 7 (ref 5–15)
BUN: 21 mg/dL (ref 8–23)
CO2: 25 mmol/L (ref 22–32)
Calcium: 9.1 mg/dL (ref 8.9–10.3)
Chloride: 104 mmol/L (ref 98–111)
Creatinine, Ser: 1.42 mg/dL — ABNORMAL HIGH (ref 0.61–1.24)
GFR, Estimated: 54 mL/min — ABNORMAL LOW (ref >60)
Glucose, Bld: 102 mg/dL — ABNORMAL HIGH (ref 70–99)
Potassium: 4.4 mmol/L (ref 3.5–5.1)
Sodium: 136 mmol/L (ref 135–145)

## 2023-03-21 LAB — BRAIN NATRIURETIC PEPTIDE: B Natriuretic Peptide: 532.5 pg/mL — ABNORMAL HIGH (ref 0.0–100.0)

## 2023-03-21 NOTE — Patient Instructions (Signed)
INCREASE Toprol XL to 50mg  daily  Routine lab work today. Will notify you of abnormal results  Follow up and echo in 3 months with Dr.Bensimhon  Do the following things EVERYDAY: Weigh yourself in the morning before breakfast. Write it down and keep it in a log. Take your medicines as prescribed Eat low salt foods--Limit salt (sodium) to 2000 mg per day.  Stay as active as you can everyday Limit all fluids for the day to less than 2 liters

## 2023-04-01 LAB — CUP PACEART REMOTE DEVICE CHECK
Battery Remaining Longevity: 46 mo
Battery Voltage: 2.98 V
Brady Statistic RV Percent Paced: 0.16 %
Date Time Interrogation Session: 20241211041532
HighPow Impedance: 63 Ohm
Implantable Lead Connection Status: 753985
Implantable Lead Implant Date: 20170428
Implantable Lead Location: 753862
Implantable Pulse Generator Implant Date: 20170428
Lead Channel Impedance Value: 266 Ohm
Lead Channel Impedance Value: 323 Ohm
Lead Channel Pacing Threshold Amplitude: 0.875 V
Lead Channel Pacing Threshold Pulse Width: 0.4 ms
Lead Channel Sensing Intrinsic Amplitude: 10.25 mV
Lead Channel Sensing Intrinsic Amplitude: 10.25 mV
Lead Channel Setting Pacing Amplitude: 2 V
Lead Channel Setting Pacing Pulse Width: 0.4 ms
Lead Channel Setting Sensing Sensitivity: 0.3 mV
Zone Setting Status: 755011
Zone Setting Status: 755011

## 2023-04-04 ENCOUNTER — Ambulatory Visit (INDEPENDENT_AMBULATORY_CARE_PROVIDER_SITE_OTHER): Payer: Medicare HMO

## 2023-04-04 DIAGNOSIS — I5022 Chronic systolic (congestive) heart failure: Secondary | ICD-10-CM | POA: Diagnosis not present

## 2023-04-04 DIAGNOSIS — I5043 Acute on chronic combined systolic (congestive) and diastolic (congestive) heart failure: Secondary | ICD-10-CM

## 2023-05-09 NOTE — Progress Notes (Signed)
Remote ICD transmission.   

## 2023-05-25 ENCOUNTER — Telehealth: Payer: Self-pay | Admitting: Cardiovascular Disease

## 2023-05-25 NOTE — Telephone Encounter (Signed)
 Patient wants to know which over-the-counter medication he can take for a cold.

## 2023-05-26 NOTE — Progress Notes (Signed)
This encounter was created in error - please disregard.

## 2023-05-26 NOTE — Telephone Encounter (Signed)
 I spoke with the patient. He advised he went to the pharmacy yesterday and the pharmacist advised that since his symptoms cold like symptoms have been persistent for over a week, he may need an antibiotic. Per the patient, he has reached out to his PCP and is waiting on a call back from them.  I advised that is appropriate and I also advised I sent him our Pharm D recommendations for what to use OTC as well and asked him to review this and hold on to this information for any future reference.  The patient voices understanding and is agreeable.    He was very appreciative of the call back.

## 2023-05-26 NOTE — Telephone Encounter (Signed)
You want to avoid any product that says its "D" or "PE",  but DM (dextromethorphan) is ok to take for cough. The "D" means it has pseudoephedrine (Sudafed) in it and "PE" means it has phenylephrine in it. These both can increase your blood pressure and heart rate. You also want to avoid NSAID's like ibuprofen and naproxen. Tylenol is ok to take for pain/fever. You can try saline nasal rinses (such as a netti pot) for congestion along with Flonase or Atrovent. Cough drops are ok too for sore throat. Antihistamines like chlorpheniramine (can cause drowsiness) is good for runny nose/sneezing. Antihistamines like allegra and zyrtec are ok to take as well. These do not cause drowsiness typically. For a productive cough, can take generic or brand Mucinex. For a dry cough generic or brand Delsym (dextromethorphan).  

## 2023-05-27 ENCOUNTER — Other Ambulatory Visit: Payer: Self-pay

## 2023-05-27 ENCOUNTER — Encounter: Payer: Medicare HMO | Admitting: Cardiology

## 2023-05-27 ENCOUNTER — Emergency Department: Payer: Medicare HMO

## 2023-05-27 ENCOUNTER — Inpatient Hospital Stay
Admission: EM | Admit: 2023-05-27 | Discharge: 2023-05-30 | DRG: 871 | Disposition: A | Discharge status: Home / Self Care | Payer: Medicare HMO | Attending: Internal Medicine | Admitting: Internal Medicine

## 2023-05-27 DIAGNOSIS — J189 Pneumonia, unspecified organism: Secondary | ICD-10-CM | POA: Insufficient documentation

## 2023-05-27 DIAGNOSIS — B9729 Other coronavirus as the cause of diseases classified elsewhere: Secondary | ICD-10-CM | POA: Diagnosis present

## 2023-05-27 DIAGNOSIS — E669 Obesity, unspecified: Secondary | ICD-10-CM | POA: Diagnosis present

## 2023-05-27 DIAGNOSIS — I2489 Other forms of acute ischemic heart disease: Secondary | ICD-10-CM | POA: Diagnosis present

## 2023-05-27 DIAGNOSIS — J9601 Acute respiratory failure with hypoxia: Principal | ICD-10-CM | POA: Diagnosis present

## 2023-05-27 DIAGNOSIS — J440 Chronic obstructive pulmonary disease with acute lower respiratory infection: Secondary | ICD-10-CM | POA: Diagnosis present

## 2023-05-27 DIAGNOSIS — I420 Dilated cardiomyopathy: Secondary | ICD-10-CM | POA: Diagnosis present

## 2023-05-27 DIAGNOSIS — E785 Hyperlipidemia, unspecified: Secondary | ICD-10-CM | POA: Diagnosis present

## 2023-05-27 DIAGNOSIS — F419 Anxiety disorder, unspecified: Secondary | ICD-10-CM | POA: Diagnosis present

## 2023-05-27 DIAGNOSIS — I509 Heart failure, unspecified: Secondary | ICD-10-CM

## 2023-05-27 DIAGNOSIS — Z83438 Family history of other disorder of lipoprotein metabolism and other lipidemia: Secondary | ICD-10-CM | POA: Diagnosis not present

## 2023-05-27 DIAGNOSIS — Z8249 Family history of ischemic heart disease and other diseases of the circulatory system: Secondary | ICD-10-CM

## 2023-05-27 DIAGNOSIS — Z91013 Allergy to seafood: Secondary | ICD-10-CM

## 2023-05-27 DIAGNOSIS — R0902 Hypoxemia: Secondary | ICD-10-CM | POA: Diagnosis not present

## 2023-05-27 DIAGNOSIS — I5023 Acute on chronic systolic (congestive) heart failure: Secondary | ICD-10-CM | POA: Diagnosis not present

## 2023-05-27 DIAGNOSIS — Z833 Family history of diabetes mellitus: Secondary | ICD-10-CM

## 2023-05-27 DIAGNOSIS — I480 Paroxysmal atrial fibrillation: Secondary | ICD-10-CM | POA: Diagnosis present

## 2023-05-27 DIAGNOSIS — Z7901 Long term (current) use of anticoagulants: Secondary | ICD-10-CM | POA: Diagnosis not present

## 2023-05-27 DIAGNOSIS — I2583 Coronary atherosclerosis due to lipid rich plaque: Secondary | ICD-10-CM | POA: Diagnosis not present

## 2023-05-27 DIAGNOSIS — Z95810 Presence of automatic (implantable) cardiac defibrillator: Secondary | ICD-10-CM | POA: Diagnosis not present

## 2023-05-27 DIAGNOSIS — Z79899 Other long term (current) drug therapy: Secondary | ICD-10-CM | POA: Diagnosis not present

## 2023-05-27 DIAGNOSIS — I130 Hypertensive heart and chronic kidney disease with heart failure and stage 1 through stage 4 chronic kidney disease, or unspecified chronic kidney disease: Secondary | ICD-10-CM | POA: Diagnosis present

## 2023-05-27 DIAGNOSIS — I2510 Atherosclerotic heart disease of native coronary artery without angina pectoris: Secondary | ICD-10-CM | POA: Diagnosis present

## 2023-05-27 DIAGNOSIS — I5043 Acute on chronic combined systolic (congestive) and diastolic (congestive) heart failure: Secondary | ICD-10-CM | POA: Diagnosis present

## 2023-05-27 DIAGNOSIS — R7989 Other specified abnormal findings of blood chemistry: Secondary | ICD-10-CM | POA: Diagnosis not present

## 2023-05-27 DIAGNOSIS — A4189 Other specified sepsis: Secondary | ICD-10-CM | POA: Diagnosis present

## 2023-05-27 DIAGNOSIS — B342 Coronavirus infection, unspecified: Secondary | ICD-10-CM | POA: Diagnosis not present

## 2023-05-27 DIAGNOSIS — Z1152 Encounter for screening for COVID-19: Secondary | ICD-10-CM

## 2023-05-27 DIAGNOSIS — E871 Hypo-osmolality and hyponatremia: Secondary | ICD-10-CM | POA: Diagnosis present

## 2023-05-27 DIAGNOSIS — I493 Ventricular premature depolarization: Secondary | ICD-10-CM | POA: Diagnosis not present

## 2023-05-27 DIAGNOSIS — N1831 Chronic kidney disease, stage 3a: Secondary | ICD-10-CM

## 2023-05-27 DIAGNOSIS — Z7984 Long term (current) use of oral hypoglycemic drugs: Secondary | ICD-10-CM | POA: Diagnosis not present

## 2023-05-27 DIAGNOSIS — I4891 Unspecified atrial fibrillation: Secondary | ICD-10-CM | POA: Diagnosis not present

## 2023-05-27 LAB — TROPONIN I (HIGH SENSITIVITY)
Troponin I (High Sensitivity): 136 ng/L (ref <18)
Troponin I (High Sensitivity): 185 ng/L (ref <18)

## 2023-05-27 LAB — RESP PANEL BY RT-PCR (RSV, FLU A&B, COVID)  RVPGX2
Influenza A by PCR: NEGATIVE
Influenza B by PCR: NEGATIVE
Resp Syncytial Virus by PCR: NEGATIVE
SARS Coronavirus 2 by RT PCR: NEGATIVE

## 2023-05-27 LAB — CBC
HCT: 45 % (ref 39.0–52.0)
Hemoglobin: 14.7 g/dL (ref 13.0–17.0)
MCH: 27.3 pg (ref 26.0–34.0)
MCHC: 32.7 g/dL (ref 30.0–36.0)
MCV: 83.6 fL (ref 80.0–100.0)
Platelets: 168 10*3/uL (ref 150–400)
RBC: 5.38 MIL/uL (ref 4.22–5.81)
RDW: 14.6 % (ref 11.5–15.5)
WBC: 14.8 10*3/uL — ABNORMAL HIGH (ref 4.0–10.5)
nRBC: 0 % (ref 0.0–0.2)

## 2023-05-27 LAB — BASIC METABOLIC PANEL
Anion gap: 15 (ref 5–15)
BUN: 32 mg/dL — ABNORMAL HIGH (ref 8–23)
CO2: 21 mmol/L — ABNORMAL LOW (ref 22–32)
Calcium: 9 mg/dL (ref 8.9–10.3)
Chloride: 97 mmol/L — ABNORMAL LOW (ref 98–111)
Creatinine, Ser: 1.8 mg/dL — ABNORMAL HIGH (ref 0.61–1.24)
GFR, Estimated: 41 mL/min — ABNORMAL LOW (ref >60)
Glucose, Bld: 163 mg/dL — ABNORMAL HIGH (ref 70–99)
Potassium: 4.6 mmol/L (ref 3.5–5.1)
Sodium: 133 mmol/L — ABNORMAL LOW (ref 135–145)

## 2023-05-27 LAB — PROTIME-INR
INR: 2.3 — ABNORMAL HIGH (ref 0.8–1.2)
Prothrombin Time: 25.6 s — ABNORMAL HIGH (ref 11.4–15.2)

## 2023-05-27 LAB — BRAIN NATRIURETIC PEPTIDE: B Natriuretic Peptide: >4500 pg/mL — ABNORMAL HIGH (ref 0.0–100.0)

## 2023-05-27 LAB — RESPIRATORY PANEL BY PCR

## 2023-05-27 LAB — MAGNESIUM: Magnesium: 1.9 mg/dL (ref 1.7–2.4)

## 2023-05-27 LAB — HIV ANTIBODY (ROUTINE TESTING W REFLEX): HIV Screen 4th Generation wRfx: NONREACTIVE

## 2023-05-27 LAB — HEPARIN LEVEL (UNFRACTIONATED): Heparin Unfractionated: >1.1 [IU]/mL — ABNORMAL HIGH (ref 0.30–0.70)

## 2023-05-27 LAB — PROCALCITONIN: Procalcitonin: 5.22 ng/mL

## 2023-05-27 LAB — APTT: aPTT: 39 s — ABNORMAL HIGH (ref 24–36)

## 2023-05-27 MED ORDER — SACUBITRIL-VALSARTAN 49-51 MG PO TABS: 1.0000 | ORAL_TABLET | Freq: Two times a day (BID) | ORAL | Status: DC

## 2023-05-27 MED ORDER — SODIUM CHLORIDE 0.9% FLUSH
3.0000 mL | Freq: Two times a day (BID) | INTRAVENOUS | Status: DC
  Administered 2023-05-27 – 2023-05-30 (×7): 3 mL via INTRAVENOUS

## 2023-05-27 MED ORDER — FUROSEMIDE 10 MG/ML IJ SOLN
80.0000 mg | Freq: Once | INTRAMUSCULAR | Status: AC
  Administered 2023-05-27: 80 mg via INTRAVENOUS
  Filled 2023-05-27: qty 8

## 2023-05-27 MED ORDER — GUAIFENESIN ER 600 MG PO TB12
1200.0000 mg | ORAL_TABLET | Freq: Two times a day (BID) | ORAL | Status: DC
  Administered 2023-05-27 – 2023-05-30 (×7): 1200 mg via ORAL
  Filled 2023-05-27 (×7): qty 2

## 2023-05-27 MED ORDER — FUROSEMIDE 10 MG/ML IJ SOLN
40.0000 mg | Freq: Two times a day (BID) | INTRAMUSCULAR | Status: DC
  Administered 2023-05-27 – 2023-05-29 (×5): 40 mg via INTRAVENOUS
  Filled 2023-05-27 (×6): qty 4

## 2023-05-27 MED ORDER — SPIRONOLACTONE 25 MG PO TABS
25.0000 mg | ORAL_TABLET | Freq: Every day | ORAL | Status: DC
  Administered 2023-05-28 – 2023-05-30 (×3): 25 mg via ORAL
  Filled 2023-05-27 (×3): qty 1

## 2023-05-27 MED ORDER — SODIUM CHLORIDE 0.9 % IV SOLN
500.0000 mg | Freq: Once | INTRAVENOUS | Status: AC
  Administered 2023-05-27: 500 mg via INTRAVENOUS
  Filled 2023-05-27: qty 5

## 2023-05-27 MED ORDER — SODIUM CHLORIDE 0.9 % IV SOLN
2.0000 g | Freq: Once | INTRAVENOUS | Status: AC
  Administered 2023-05-27: 2 g via INTRAVENOUS
  Filled 2023-05-27: qty 20

## 2023-05-27 MED ORDER — ROSUVASTATIN CALCIUM 20 MG PO TABS: 10.0000 mg | ORAL_TABLET | Freq: Every day | ORAL | Status: DC

## 2023-05-27 MED ORDER — APIXABAN 5 MG PO TABS
5.0000 mg | ORAL_TABLET | Freq: Two times a day (BID) | ORAL | Status: DC
Start: 2023-05-27 — End: 2023-05-30
  Administered 2023-05-27 – 2023-05-30 (×7): 5 mg via ORAL
  Filled 2023-05-27 (×7): qty 1

## 2023-05-27 MED ORDER — ALBUTEROL SULFATE (2.5 MG/3ML) 0.083% IN NEBU: 3.0000 mL | INHALATION_SOLUTION | Freq: Four times a day (QID) | RESPIRATORY_TRACT | Status: DC | PRN

## 2023-05-27 MED ORDER — ONDANSETRON HCL 4 MG/2ML IJ SOLN
4.0000 mg | Freq: Four times a day (QID) | INTRAMUSCULAR | Status: DC | PRN
Start: 2023-05-27 — End: 2023-05-30

## 2023-05-27 MED ORDER — PANTOPRAZOLE SODIUM 40 MG PO TBEC
40.0000 mg | DELAYED_RELEASE_TABLET | Freq: Every day | ORAL | Status: DC
  Administered 2023-05-27 – 2023-05-30 (×4): 40 mg via ORAL
  Filled 2023-05-27 (×4): qty 1

## 2023-05-27 MED ORDER — SODIUM CHLORIDE 0.9 % IV SOLN
500.0000 mg | INTRAVENOUS | Status: DC
  Administered 2023-05-28: 500 mg via INTRAVENOUS
  Filled 2023-05-27: qty 5

## 2023-05-27 MED ORDER — FLUTICASONE PROPIONATE 50 MCG/ACT NA SUSP
1.0000 | Freq: Every day | NASAL | Status: DC
  Administered 2023-05-28 – 2023-05-29 (×2): 1 via NASAL
  Filled 2023-05-27: qty 16

## 2023-05-27 MED ORDER — SODIUM CHLORIDE 0.9 % IV SOLN
1.0000 g | INTRAVENOUS | Status: DC
  Administered 2023-05-28 – 2023-05-30 (×3): 1 g via INTRAVENOUS
  Filled 2023-05-27 (×3): qty 10

## 2023-05-27 MED ORDER — SODIUM CHLORIDE 0.9% FLUSH: 3.0000 mL | INTRAVENOUS | Status: DC | PRN

## 2023-05-27 MED ORDER — SODIUM CHLORIDE 0.9 % IV SOLN: 250.0000 mL | INTRAVENOUS | Status: AC | PRN

## 2023-05-27 MED ORDER — IPRATROPIUM-ALBUTEROL 0.5-2.5 (3) MG/3ML IN SOLN
3.0000 mL | Freq: Four times a day (QID) | RESPIRATORY_TRACT | Status: DC
  Administered 2023-05-27 (×2): 3 mL via RESPIRATORY_TRACT
  Filled 2023-05-27 (×2): qty 3

## 2023-05-27 MED ORDER — SACUBITRIL-VALSARTAN 24-26 MG PO TABS
1.0000 | ORAL_TABLET | Freq: Two times a day (BID) | ORAL | Status: DC
  Administered 2023-05-28 – 2023-05-29 (×4): 1 via ORAL
  Filled 2023-05-27 (×6): qty 1

## 2023-05-27 MED ORDER — ACETAMINOPHEN 325 MG PO TABS
650.0000 mg | ORAL_TABLET | ORAL | Status: DC | PRN
  Administered 2023-05-28: 650 mg via ORAL
  Filled 2023-05-27: qty 2

## 2023-05-27 MED ORDER — ASPIRIN 81 MG PO CHEW
324.0000 mg | CHEWABLE_TABLET | Freq: Once | ORAL | Status: AC
  Administered 2023-05-27: 324 mg via ORAL
  Filled 2023-05-27: qty 4

## 2023-05-27 MED ORDER — METOPROLOL SUCCINATE ER 50 MG PO TB24
50.0000 mg | ORAL_TABLET | Freq: Every day | ORAL | Status: DC
  Administered 2023-05-28 – 2023-05-29 (×2): 50 mg via ORAL
  Filled 2023-05-27 (×3): qty 1

## 2023-05-27 NOTE — ED Provider Notes (Signed)
 [facility] Provider Note    Event Date/Time   First MD Initiated Contact with Patient 05/27/23 [phone]     (approximate)   History   Shortness of Breath   HPI  Jerry Howe is a 68 y.o. male  parox AFib, HFrEF, NICM, HTN, CKD-3 who comes in for shortness of breath.  Patient reports creasing shortness of breath, cough over the past few days.  He denies any fevers or feeling sick.  He states that he typically gets fluid buildup on his lungs but not his legs.  He reports only taking 1 torsemide  10 mg yesterday.  He states that he was only told to take it once a week.  He states that he really has not gained any significant weight over the past few days.  He denies any abdominal pain, chest pain.  He does not have oxygen at home.  He has been compliant with his Eliquis .  Does report a cough but denies any fevers.  Patient has an ICD and he denies any syncope or feeling his ICD go off.   Physical Exam   Triage Vital Signs: ED Triage Vitals  Encounter Vitals Group     BP 05/27/23 0902 (!) 143/93     Systolic BP Percentile --      Diastolic BP Percentile --      Pulse Rate 05/27/23 0902 87     Resp 05/27/23 0902 20     Temp 05/27/23 0902 98 F (36.7 C)     Temp Source 05/27/23 0902 Oral     SpO2 05/27/23 0902 (!) 88 %     Weight --      Height --      Head Circumference --      Peak Flow --      Pain Score 05/27/23 0903 0     Pain Loc --      Pain Education --      Exclude from Growth Chart --     Most recent vital signs: Vitals:   05/27/23 0902  BP: (!) 143/93  Pulse: 87  Resp: 20  Temp: 98 F (36.7 C)  SpO2: (!) 88%     General: Awake, no distress.  CV:  Good peripheral perfusion.  Resp:  Normal effort.  Clear lungs Abd:  No distention.  Other:  trace's edema noted bilaterally   ED Results / Procedures / Treatments   Labs (all labs ordered are listed, but only abnormal results are displayed) Labs Reviewed  RESP PANEL BY RT-PCR  (RSV, FLU A&B, COVID)  [id number]  BRAIN NATRIURETIC PEPTIDE  CBC  BASIC METABOLIC PANEL  TROPONIN I (HIGH SENSITIVITY)     EKG  My interpretation of EKG:  Normal sinus rate of 92 without any ST elevation or T wave inversions, to PVCs consectively   RADIOLOGY I have reviewed the xray personally and interpreted patient has some bilateral patchiness noted bilaterally  PROCEDURES:  Critical Care performed: Yes, see critical care procedure note(s)  .Critical Care  Performed by: Ernest Ronal BRAVO, MD Authorized by: Ernest Ronal BRAVO, MD   Critical care provider statement:    Critical care time (minutes):  30   Critical care was necessary to treat or prevent imminent or life-threatening deterioration of the following conditions:  Respiratory failure   Critical care was time spent personally by me on the following activities:  Development of treatment plan with patient or surrogate, discussions with consultants, evaluation of patient's response to treatment,  examination of patient, ordering and review of laboratory studies, ordering and review of radiographic studies, ordering and performing treatments and interventions, pulse oximetry, re-evaluation of patient's condition and review of old charts .1-3 Lead EKG Interpretation  Performed by: Ernest Ronal BRAVO, MD Authorized by: Ernest Ronal BRAVO, MD     Interpretation: normal     ECG rate:  80   ECG rate assessment: normal     Rhythm: sinus rhythm     Ectopy: none     Conduction: normal      MEDICATIONS ORDERED IN ED: Medications  cefTRIAXone  (ROCEPHIN ) 2 g in sodium chloride  0.9 % 100 mL IVPB (2 g Intravenous New Bag/Given 05/27/23 1028)  azithromycin  (ZITHROMAX ) 500 mg in sodium chloride  0.9 % 250 mL IVPB (has no administration in time range)  furosemide  (LASIX ) injection 80 mg (80 mg Intravenous Given 05/27/23 1026)  aspirin  chewable tablet 324 mg (324 mg Oral Given 05/27/23 1027)     IMPRESSION / MDM / ASSESSMENT AND PLAN / ED COURSE  I  reviewed the triage vital signs and the nursing notes.   Patient's presentation is most consistent with acute presentation with potential threat to life or bodily function.   Patient comes in with worsening shortness of breath, cough, differential includes pneumonia, ACS, CHF.  Doubt PE given patient has been compliant with Eliquis .  Chest x-ray concerning for edema versus atypical pneumonia.  Will cover with antibiotics for now given elevated white count but patient does not meet sepsis criteria therefore blood cultures, lactate were not ordered.  Going to hold off on fluids due to concern for potential CHF.  Will give a dose of 80 mg of IV Lasix .  I we will start patient on heparin , aspirin  given elevated troponin but at this time I suspect this is more likely demand from the CHF.  I will discuss with the hospital team for admission.  The patient is on the cardiac monitor to evaluate for evidence of arrhythmia and/or significant heart rate changes.      FINAL CLINICAL IMPRESSION(S) / ED DIAGNOSES   Final diagnoses:  Acute respiratory failure with hypoxia (HCC)  Acute on chronic congestive heart failure, unspecified heart failure type (HCC)     Rx / DC Orders   ED Discharge Orders     None        Note:  This document was prepared using Dragon voice recognition software and may include unintentional dictation errors.   Ernest Ronal BRAVO, MD 05/27/23 [phone]

## 2023-05-27 NOTE — Progress Notes (Signed)
 Heart Failure Navigator Progress Note  Assessed for Heart & Vascular TOC clinic readiness.  Patient does not meet criteria due to current Advanced Heart Failure Team patient of Bevelyn Buckles. Bensimhon, MD.   Navigator will sign off at this time.  Roxy Horseman, RN, BSN [facility] Heart Failure Navigator Secure Chat Only

## 2023-05-27 NOTE — H&P (Addendum)
 History and Physical    Jerry Howe FMW:[id number] DOB: Dec 13, 1955 DOA: 05/27/2023  PCP: System, Provider Not In (Confirm with patient/family/NH records and if not entered, this has to be entered at [facility] point of entry) Patient coming from: Home  I have personally briefly reviewed patient's old medical records in [facility]  Chief Complaint: Cough, SOB  HPI: Jerry Howe is a 68 y.o. male with medical history significant of nonischemic cardiomyopathy, chronic HFrEF with LVEF 20-25%, PAF on Eliquis , AICD, HTN, HLD, CKD stage II, presented with increasing productive cough and shortness of breath.  Symptoms started 1 week ago, patient first developed runny nose and sore throat and then followed by productive cough initially with light-colored sputum, last few days the color of sputum and turned into yellowish with increasing shortness of breath and orthopnea that patient has been sleeping on recliner for last 4 nights.  He weighs himself every day and found his weight has been stable and he does notice mild swelling but he attributes that to gravity due to sleeping on a recliner.  No chest pain no fever or chills.  Today, patient went to see cardiology who found patient was hypoxic and sent patient to ED for further evaluation. ED Course: O2 saturation 88% on room air, blood pressure 140/90, afebrile.  Chest x-ray showed bilateral multifocal infiltrates suspected for multifocal pneumonia versus CHF decompensation and pulmonary edema.  Blood pressure, creatinine 1.8 compared to baseline 1.4-1.8, sodium 133, troponin 130.  proBNP more than 4,500, procalcitonin 5.1  Patient received ceftriaxone  and azithromycin  and Lasix  80 mg x 1  Review of Systems: As per HPI otherwise 14 point review of systems negative.    Past Medical History:  Diagnosis Date   Anxiety    Arrhythmia    CHF (congestive heart failure) (HCC)    Hypertension    Kidney stones    Shortness of breath dyspnea     Past  Surgical History:  Procedure Laterality Date   ATRIAL FIBRILLATION ABLATION N/A 05/24/2022   Procedure: ATRIAL FIBRILLATION ABLATION;  Surgeon: Cindie Ole DASEN, MD;  Location: [facility] INVASIVE CV LAB;  Service: Cardiovascular;  Laterality: N/A;   CARDIAC CATHETERIZATION Right 11/20/2014   Procedure: Left Heart Cath and Coronary Angiography;  Surgeon: Marsa Dooms, MD;  Location: [facility] INVASIVE CV LAB;  Service: Cardiovascular;  Laterality: Right;   IMPLANTABLE CARDIOVERTER DEFIBRILLATOR (ICD) GENERATOR CHANGE Left 08/15/2015   Procedure: ICD IMPLANT, single chamber;  Surgeon: Franky LITTIE Ned, MD;  Location: [facility] ORS;  Service: Cardiovascular;  Laterality: Left;   KNEE SURGERY     RIGHT/LEFT HEART CATH AND CORONARY ANGIOGRAPHY N/A 02/10/2022   Procedure: RIGHT/LEFT HEART CATH AND CORONARY ANGIOGRAPHY;  Surgeon: Mady Bruckner, MD;  Location: [facility] INVASIVE CV LAB;  Service: Cardiovascular;  Laterality: N/A;   TEE WITHOUT CARDIOVERSION N/A 05/24/2022   Procedure: TRANSESOPHAGEAL ECHOCARDIOGRAM;  Surgeon: Cindie Ole DASEN, MD;  Location: [facility] INVASIVE CV LAB;  Service: Cardiovascular;  Laterality: N/A;     reports that he has never smoked. He has never used smokeless tobacco. He reports that he does not currently use drugs. He reports that he does not drink alcohol.  Allergies  Allergen Reactions   Shellfish Allergy Anaphylaxis   Rosuvastatin      Dizziness     Family History  Problem Relation Age of Onset   Hyperlipidemia Mother    Hypertension Father    Heart disease Father    Hyperlipidemia Brother    Diabetes Brother    Heart  failure Neg Hx      Prior to Admission medications   Medication Sig Start Date End Date Taking? Authorizing Provider  amoxicillin -clavulanate (AUGMENTIN ) 875-125 MG tablet Take 1 tablet by mouth 2 (two) times daily. Take 1 tablet (875 mg total) by mouth every 12 (twelve) hours for 7 days 05/26/23 06/02/23 Yes [provider]  doxycycline  (VIBRA -TABS) 100  MG tablet Take 100 mg by mouth 2 (two) times daily. 05/26/23  Yes [provider]  ENTRESTO  24-26 MG Take 1 tablet by mouth 2 (two) times daily. 03/20/22  Yes [provider]  fluticasone  (FLONASE ) 50 MCG/ACT nasal spray Place 1 spray into both nostrils in the morning and at bedtime. 05/26/23  Yes [provider]  promethazine-dextromethorphan  (PROMETHAZINE-DM) 6.25-15 MG/5ML syrup Take 5 mLs by mouth 4 (four) times daily as needed. 05/26/23  Yes [provider]  rosuvastatin  (CRESTOR ) 10 MG tablet Take 10 mg by mouth at bedtime. 02/22/23  Yes [provider]  albuterol  (PROVENTIL  HFA;VENTOLIN  HFA) 108 (90 Base) MCG/ACT inhaler Inhale 1 puff into the lungs every 6 (six) hours as needed for wheezing or shortness of breath.    [provider]  apixaban  (ELIQUIS ) 5 MG TABS tablet Take 1 tablet (5 mg total) by mouth 2 (two) times daily. 12/15/22   Bensimhon, Toribio SAUNDERS, MD  Ascorbic Acid  (VITAMIN C ) 1000 MG tablet Take 1,000 mg by mouth daily.    [provider]  cyanocobalamin  2000 MCG tablet Take 2,000 mcg by mouth daily.    [provider]  empagliflozin  (JARDIANCE ) 10 MG TABS tablet Take 1 tablet (10 mg total) by mouth daily before breakfast. 02/22/22   Bensimhon, Toribio SAUNDERS, MD  Magnesium  200 MG TABS Take 200 mg by mouth daily.    [provider]  metoprolol  succinate (TOPROL -XL) 25 MG 24 hr tablet Take 50 mg by mouth daily. 11/25/21   [provider]  Multiple Vitamins-Minerals (SUPER THERA VITE M PO) Take 1 tablet by mouth daily.    [provider]  Naphazoline-Glycerin  (REDNESS RELIEF OP) Place 1 drop into both eyes 2 (two) times daily.    [provider]  omeprazole (PRILOSEC) 20 MG capsule Take 20 mg by mouth daily.    [provider]  pantoprazole  (PROTONIX ) 40 MG tablet Take 1 tablet (40 mg total) by mouth daily. 05/24/22 09/08/22  Leverne Charlies Helling, PA-C  pyridoxine  (B-6) 500 MG tablet Take 500  mg by mouth daily.    [provider]  sacubitril -valsartan  (ENTRESTO ) 49-51 MG Take 1 tablet by mouth 2 (two) times daily. 12/15/22   Bensimhon, Toribio SAUNDERS, MD  spironolactone  (ALDACTONE ) 25 MG tablet Take 1 tablet (25 mg total) by mouth daily. 12/15/22 12/15/23  Bensimhon, Toribio SAUNDERS, MD  torsemide  (DEMADEX ) 20 MG tablet Take 1 tablet (20 mg total) by mouth daily as needed. 12/15/22 12/15/23  Bensimhon, Toribio SAUNDERS, MD    Physical Exam: Vitals:   05/27/23 0902 05/27/23 1100 05/27/23 1130 05/27/23 1200  BP: (!) 143/93 120/70 116/75 110/79  Pulse: 87 83 80 73  Resp: 20 19 (!) 21 (!) 28  Temp: 98 F (36.7 C)     TempSrc: Oral     SpO2: (!) 88% 99% 100% 99%    Constitutional: NAD, calm, comfortable Vitals:   05/27/23 0902 05/27/23 1100 05/27/23 1130 05/27/23 1200  BP: (!) 143/93 120/70 116/75 110/79  Pulse: 87 83 80 73  Resp: 20 19 (!) 21 (!) 28  Temp: 98 F (36.7 C)  TempSrc: Oral     SpO2: (!) 88% 99% 100% 99%   Eyes: PERRL, lids and conjunctivae normal ENMT: Mucous membranes are moist. Posterior pharynx clear of any exudate or lesions.Normal dentition.  Neck: normal, supple, no masses, no thyromegaly Respiratory: clear to auscultation bilaterally, no wheezing, diffuse crackles bilaterally, increasing respiratory effort. No accessory muscle use.  Cardiovascular: Regular rate and rhythm, no murmurs / rubs / gallops. 1+ extremity edema. 2+ pedal pulses. No carotid bruits.  Abdomen: no tenderness, no masses palpated. No hepatosplenomegaly. Bowel sounds positive.  Musculoskeletal: no clubbing / cyanosis. No joint deformity upper and lower extremities. Good ROM, no contractures. Normal muscle tone.  Skin: no rashes, lesions, ulcers. No induration Neurologic: CN 2-12 grossly intact. Sensation intact, DTR normal. Strength 5/5 in all 4.  Psychiatric: Normal judgment and insight. Alert and oriented x 3. Normal mood.     Labs on Admission: I have personally reviewed following labs and  imaging studies  CBC: Recent Labs  Lab 05/27/23 0908  WBC 14.8*  HGB 14.7  HCT 45.0  MCV 83.6  PLT 168   Basic Metabolic Panel: Recent Labs  Lab 05/27/23 0908  NA 133*  K 4.6  CL 97*  CO2 21*  GLUCOSE 163*  BUN 32*  CREATININE 1.80*  CALCIUM  9.0   GFR: CrCl cannot be calculated (Unknown ideal weight.). Liver Function Tests: No results for input(s): AST, ALT, ALKPHOS, BILITOT, PROT, ALBUMIN in the last 168 hours. No results for input(s): LIPASE, AMYLASE in the last 168 hours. No results for input(s): AMMONIA in the last 168 hours. Coagulation Profile: No results for input(s): INR, PROTIME in the last 168 hours. Cardiac Enzymes: No results for input(s): CKTOTAL, CKMB, CKMBINDEX, TROPONINI in the last 168 hours. BNP (last 3 results) No results for input(s): PROBNP in the last 8760 hours. HbA1C: No results for input(s): HGBA1C in the last 72 hours. CBG: No results for input(s): GLUCAP in the last 168 hours. Lipid Profile: No results for input(s): CHOL, HDL, LDLCALC, TRIG, CHOLHDL, LDLDIRECT in the last 72 hours. Thyroid  Function Tests: No results for input(s): TSH, T4TOTAL, FREET4, T3FREE, THYROIDAB in the last 72 hours. Anemia Panel: No results for input(s): VITAMINB12, FOLATE, FERRITIN, TIBC, IRON , RETICCTPCT in the last 72 hours. Urine analysis:    Component Value Date/Time   COLORURINE AMBER (A) 08/04/2015 1452   APPEARANCEUR HAZY (A) 08/04/2015 1452   LABSPEC 1.019 08/04/2015 1452   PHURINE 5.0 08/04/2015 1452   GLUCOSEU NEGATIVE 08/04/2015 1452   HGBUR NEGATIVE 08/04/2015 1452   BILIRUBINUR NEGATIVE 08/04/2015 1452   KETONESUR NEGATIVE 08/04/2015 1452   PROTEINUR NEGATIVE 08/04/2015 1452   NITRITE NEGATIVE 08/04/2015 1452   LEUKOCYTESUR NEGATIVE 08/04/2015 1452    Radiological Exams on Admission: DG Chest 2 View Result Date: 05/27/2023 CLINICAL DATA:  Several day history of  worsening shortness of breath EXAM: CHEST - 2 VIEW COMPARISON:  Chest radiograph dated 02/07/2022 FINDINGS: Lines/tubes: Left chest wall ICD lead projects over the right ventricle. Lungs: Well inflated lungs. Multifocal rounded and patchy opacities, right-greater-than-left. Pleura: No pneumothorax or pleural effusion. Heart/mediastinum: Similar enlarged cardiomediastinal silhouette. Bones: No acute osseous abnormality. IMPRESSION: 1. Multifocal rounded and patchy opacities, right-greater-than-left, which may represent multifocal pneumonia or asymmetric pulmonary edema in the acute setting. Recommend repeat chest radiograph in 12 weeks to ensure resolution. 2. Similar cardiomegaly. Electronically Signed   By: Limin  Xu M.D.   On: 05/27/2023 09:33    EKG: Independently reviewed.  Sinus rhythm, no acute ST changes.  Assessment/Plan  Principal Problem:   CHF (congestive heart failure) (HCC) Active Problems:   Acute on chronic combined systolic and diastolic CHF (congestive heart failure) (HCC)   CAP (community acquired pneumonia)  (please populate well all problems here in Problem List. (For example, if patient is on BP meds at home and you resume or decide to hold them, it is a problem that needs to be her. Same for CAD, COPD, HLD and so on)  Acute hypoxic respiratory failure -Clinically cannot distinguish acute CHF decompensation and multifocal pneumonia, we will manage both conditions concurrently -Will check CT chest without contrast -Continue IV Lasix  40 mg twice daily -Continue ceftriaxone  and azithromycin , culture sputum, send atypical pneumonia study mycoplasma and Legionella -Breathing treatment DuoNebs and as needed albuterol  -Incentive spirometry and flutter valve  Sepsis Multifocal pneumonia -Without endorgan damage, sepsis evidenced by acute hypoxia and leukocytosis, source of infection is multifocal pneumonia and management as above  Acute on chronic HFrEF decompensation -Diuresis  as above -Continue Entresto  and Aldactone  -I&O daily -BMP daily  Elevated troponins -Denied chest pain and EKG showed no acute ST changes.  Etiology likely represent demand ischemia secondary to pneumonia and/or CHF decompensation.  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  PAF -In sinus rhythm -Continue Eliquis   CKD stage II -With symptoms signs of fluid overload, with stable creatinine level -Diuresis as above  DVT prophylaxis: Eliquis  Code Status: Full code Family Communication: Wife at bedside Disposition Plan: Patient is sick with sepsis and acute CHF decompensation requiring IV diuresis and IV antibiotics and expect more than 2 midnight hospital stay Consults called: None Admission status: Tele admit   Cort ONEIDA Mana MD [facility] Pager [phone]  05/27/2023, 12:29 PM

## 2023-05-27 NOTE — Progress Notes (Signed)
       CROSS COVER NOTE  NAME: GOODWIN KAMPHAUS MRN: [id number] DOB : 02/03/1956 ATTENDING PHYSICIAN: Laurita Cort DASEN, MD    Date of Service   05/27/2023   HPI/Events of Note   Nurse reports asymptomatic 15 beat v tach run Patient with systolic heart failure exacerbation. EF 20-25%  Interventions   Assessment/Plan:    05/27/2023    8:07 PM 05/27/2023    5:30 PM 05/27/2023    3:31 PM  Vitals with BMI  Systolic 90 114 100  Diastolic 63 70 77  Pulse 81 82 75   EF 20-25 %   Check mag - replete mag above 2 K above 4        Erminio LITTIE Cone NP [facility] Cross Cover 7pm-7am - check amion for availability Pager [phone]

## 2023-05-27 NOTE — ED Notes (Signed)
 This RN notified Boston Byers, NP of patients HR anywhere from 107-144 at this time.

## 2023-05-27 NOTE — ED Triage Notes (Signed)
 Pt to ED via POV from home. Pt reports SOB "for awhile" but worse in the last few days. Pt states feel he has fluid around his lungs. Pt denies hx of CHF or COPD. Pt denies CP. RA sats 88-93%.

## 2023-05-27 NOTE — ED Notes (Signed)
 This RN and other staff noticed that the patient had a 15 beat run of Ventricular tachycardia at this time. The patient is drowsy, but arousable and oriented x 4. Denies any chest pain. VS are as follows: HR: 80, BP: 108/83, and SpO2: 94%.

## 2023-05-27 NOTE — ED Notes (Signed)
 Radiology to take pt to rm 51 after imaging

## 2023-05-28 ENCOUNTER — Inpatient Hospital Stay: Payer: Medicare HMO

## 2023-05-28 DIAGNOSIS — I480 Paroxysmal atrial fibrillation: Secondary | ICD-10-CM | POA: Diagnosis not present

## 2023-05-28 DIAGNOSIS — R0902 Hypoxemia: Secondary | ICD-10-CM | POA: Diagnosis not present

## 2023-05-28 DIAGNOSIS — J189 Pneumonia, unspecified organism: Secondary | ICD-10-CM | POA: Diagnosis not present

## 2023-05-28 DIAGNOSIS — B342 Coronavirus infection, unspecified: Secondary | ICD-10-CM | POA: Diagnosis not present

## 2023-05-28 DIAGNOSIS — I5023 Acute on chronic systolic (congestive) heart failure: Secondary | ICD-10-CM | POA: Diagnosis not present

## 2023-05-28 DIAGNOSIS — N1831 Chronic kidney disease, stage 3a: Secondary | ICD-10-CM | POA: Diagnosis not present

## 2023-05-28 DIAGNOSIS — I5043 Acute on chronic combined systolic (congestive) and diastolic (congestive) heart failure: Secondary | ICD-10-CM | POA: Diagnosis not present

## 2023-05-28 DIAGNOSIS — I4891 Unspecified atrial fibrillation: Secondary | ICD-10-CM

## 2023-05-28 DIAGNOSIS — R7989 Other specified abnormal findings of blood chemistry: Secondary | ICD-10-CM

## 2023-05-28 DIAGNOSIS — I493 Ventricular premature depolarization: Secondary | ICD-10-CM

## 2023-05-28 LAB — HEPATIC FUNCTION PANEL
ALT: 37 U/L (ref 0–44)
AST: 63 U/L — ABNORMAL HIGH (ref 15–41)
Albumin: 3 g/dL — ABNORMAL LOW (ref 3.5–5.0)
Alkaline Phosphatase: 59 U/L (ref 38–126)
Bilirubin, Direct: 0.6 mg/dL — ABNORMAL HIGH (ref 0.0–0.2)
Indirect Bilirubin: 1.2 mg/dL — ABNORMAL HIGH (ref 0.3–0.9)
Total Bilirubin: 1.8 mg/dL — ABNORMAL HIGH (ref 0.0–1.2)
Total Protein: 6.2 g/dL — ABNORMAL LOW (ref 6.5–8.1)

## 2023-05-28 LAB — BASIC METABOLIC PANEL
Anion gap: 9 (ref 5–15)
BUN: 33 mg/dL — ABNORMAL HIGH (ref 8–23)
CO2: 25 mmol/L (ref 22–32)
Calcium: 8.2 mg/dL — ABNORMAL LOW (ref 8.9–10.3)
Chloride: 98 mmol/L (ref 98–111)
Creatinine, Ser: 1.46 mg/dL — ABNORMAL HIGH (ref 0.61–1.24)
GFR, Estimated: 52 mL/min — ABNORMAL LOW (ref >60)
Glucose, Bld: 107 mg/dL — ABNORMAL HIGH (ref 70–99)
Potassium: 3.8 mmol/L (ref 3.5–5.1)
Sodium: 132 mmol/L — ABNORMAL LOW (ref 135–145)

## 2023-05-28 LAB — TSH: TSH: 0.036 u[IU]/mL — ABNORMAL LOW (ref 0.350–4.500)

## 2023-05-28 LAB — T4, FREE: Free T4: 1.34 ng/dL — ABNORMAL HIGH (ref 0.61–1.12)

## 2023-05-28 MED ORDER — DOXYCYCLINE HYCLATE 100 MG PO TABS
100.0000 mg | ORAL_TABLET | Freq: Two times a day (BID) | ORAL | Status: DC
  Administered 2023-05-28 – 2023-05-30 (×5): 100 mg via ORAL
  Filled 2023-05-28 (×5): qty 1

## 2023-05-28 MED ORDER — AMIODARONE HCL IN DEXTROSE 360-4.14 MG/200ML-% IV SOLN
60.0000 mg/h | INTRAVENOUS | Status: DC
  Administered 2023-05-28 (×2): 60 mg/h via INTRAVENOUS
  Filled 2023-05-28 (×2): qty 200

## 2023-05-28 MED ORDER — AMIODARONE HCL IN DEXTROSE 360-4.14 MG/200ML-% IV SOLN
30.0000 mg/h | INTRAVENOUS | Status: DC
  Administered 2023-05-28 – 2023-05-30 (×4): 30 mg/h via INTRAVENOUS
  Filled 2023-05-28 (×3): qty 200

## 2023-05-28 MED ORDER — BENZONATATE 100 MG PO CAPS
100.0000 mg | ORAL_CAPSULE | Freq: Three times a day (TID) | ORAL | Status: DC | PRN
  Administered 2023-05-28 – 2023-05-30 (×5): 100 mg via ORAL
  Filled 2023-05-28 (×5): qty 1

## 2023-05-28 MED ORDER — ORAL CARE MOUTH RINSE: 15.0000 mL | OROMUCOSAL | Status: DC | PRN

## 2023-05-28 MED ORDER — IPRATROPIUM-ALBUTEROL 0.5-2.5 (3) MG/3ML IN SOLN
3.0000 mL | Freq: Three times a day (TID) | RESPIRATORY_TRACT | Status: DC
  Administered 2023-05-28: 3 mL via RESPIRATORY_TRACT
  Filled 2023-05-28: qty 3

## 2023-05-28 MED ORDER — POTASSIUM CHLORIDE CRYS ER 10 MEQ PO TBCR
10.0000 meq | EXTENDED_RELEASE_TABLET | Freq: Once | ORAL | Status: AC
  Administered 2023-05-28: 10 meq via ORAL
  Filled 2023-05-28: qty 1

## 2023-05-28 MED ORDER — IPRATROPIUM-ALBUTEROL 0.5-2.5 (3) MG/3ML IN SOLN
3.0000 mL | Freq: Two times a day (BID) | RESPIRATORY_TRACT | Status: DC
  Administered 2023-05-28: 3 mL via RESPIRATORY_TRACT
  Filled 2023-05-28: qty 3

## 2023-05-28 MED ORDER — AMIODARONE LOAD VIA INFUSION
150.0000 mg | Freq: Once | INTRAVENOUS | Status: AC
  Administered 2023-05-28: 150 mg via INTRAVENOUS
  Filled 2023-05-28: qty 83.34

## 2023-05-28 MED ORDER — MAGNESIUM OXIDE -MG SUPPLEMENT 400 (240 MG) MG PO TABS
200.0000 mg | ORAL_TABLET | Freq: Every day | ORAL | Status: DC
  Administered 2023-05-28 – 2023-05-30 (×3): 200 mg via ORAL
  Filled 2023-05-28 (×3): qty 1

## 2023-05-28 NOTE — Consult Note (Signed)
 Cardiology Consultation:   Patient ID: Jerry Howe; [id number]; [birth year]   Admit date: 05/27/2023 Date of Consult: 05/28/2023  Primary Care Provider: System, Provider Not In Primary Cardiologist: Perla Primary Electrophysiologist:  Cindie Advanced Heart Failure: Bensimhon   Patient Profile:   Jerry Howe is a 68 y.o. male with a hx of HFrEF secondary to NICM, PAF status post ablation, CKD stage III, frequent PVCs, and HTN who is being seen today for the evaluation of acute on chronic HFrEF at the request of Dr. Darci.  History of Present Illness:   [name] was diagnosed with heart failure in 2016.  LHC at that time revealed minimal CAD.  LV was 25%.  He was previously followed by [facility] cardiology, the recently transitioned his care to [facility] health heart care.  Echo in 06/2018 showed a persistent cardiomyopathy with an EF of 25 to 30%.  He presented to the ED in 01/2022 after his defibrillator fired 6 times at [employer] after he got into a heated discussion.  He was noted to be in A-fib and converted on his own.  Interrogation showed inappropriate ICD firing for A-fib as well as noted PVCs.  In this setting he was started on amiodarone .  Echo in 01/2022 showed an EF of 20 to 25% with normal RV systolic function.  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  CPX in 03/2022 showed submaximal exercise with moderate functional limitation due to heart failure and obesity.  He underwent A-fib ablation in 05/2022.  TEE at that time showed an EF of 20 to 25% with mild to moderate mitral regurgitation.  He has been followed by general cardiology, electrophysiology, and advanced heart failure service.  At follow-up with heart failure in 03/2023 he was noted to be volume up and advised to take torsemide  20 mg once weekly.  The patient has been taking torsemide  10 mg on Mondays and Fridays.  He has been consuming a diet high in sodium.  He has noted some intermittent lower extremity swelling with a  several week history of progressive orthopnea as well as early satiety.  He was admitted to [facility] on 05/27/2023 after developing URI symptoms with progressive dyspnea and orthopnea over the preceding 4 nights.  He was found to have acute hypoxic respiratory failure with oxygen saturation of 88% on room air, felt to be multifactorial including COVID, multifocal pneumonia, acute on chronic HFrEF and exacerbated by paroxysms of A-fib with RVR.  CT chest showed multifocal pneumonia.  Labs notable for BNP greater than 4500, PCT 5.22, high-sensitivity troponin 136 with a delta troponin 185, COVID-positive, BUN 32, serum creatinine 1.8 with baseline around 1.4, and WBC 14.8.  He was briefly placed on supplemental oxygen via nasal cannula at 2 L and has subsequently been weaned to room air.  Has currently been managed with azithromycin , Rocephin ,, DuoNebs, and IV Lasix .  Review of telemetry shows sinus rhythm with frequent brief paroxysms of A-fib with RVR with rates ranging from the 130s to 160s bpm with frequent ventricular ectopy and short ventricular runs.  At time of cardiology consult, patient reports his breathing is a little improved.  He is without symptoms of chest pain, dizziness, presyncope, or syncope.  No falls or bleeding.    Past Medical History:  Diagnosis Date   Anxiety    Arrhythmia    CHF (congestive heart failure) (HCC)    Hypertension    Kidney stones    Shortness of breath dyspnea     Past Surgical  History:  Procedure Laterality Date   ATRIAL FIBRILLATION ABLATION N/A 05/24/2022   Procedure: ATRIAL FIBRILLATION ABLATION;  Surgeon: Cindie Ole DASEN, MD;  Location: [facility] INVASIVE CV LAB;  Service: Cardiovascular;  Laterality: N/A;   CARDIAC CATHETERIZATION Right 11/20/2014   Procedure: Left Heart Cath and Coronary Angiography;  Surgeon: Marsa Dooms, MD;  Location: [facility] INVASIVE CV LAB;  Service: Cardiovascular;  Laterality: Right;   IMPLANTABLE CARDIOVERTER DEFIBRILLATOR (ICD)  GENERATOR CHANGE Left 08/15/2015   Procedure: ICD IMPLANT, single chamber;  Surgeon: Franky LITTIE Ned, MD;  Location: [facility] ORS;  Service: Cardiovascular;  Laterality: Left;   KNEE SURGERY     RIGHT/LEFT HEART CATH AND CORONARY ANGIOGRAPHY N/A 02/10/2022   Procedure: RIGHT/LEFT HEART CATH AND CORONARY ANGIOGRAPHY;  Surgeon: Mady Bruckner, MD;  Location: [facility] INVASIVE CV LAB;  Service: Cardiovascular;  Laterality: N/A;   TEE WITHOUT CARDIOVERSION N/A 05/24/2022   Procedure: TRANSESOPHAGEAL ECHOCARDIOGRAM;  Surgeon: Cindie Ole DASEN, MD;  Location: [facility] INVASIVE CV LAB;  Service: Cardiovascular;  Laterality: N/A;     Home Meds: Prior to Admission medications   Medication Sig Start Date End Date Taking? Authorizing Provider  albuterol  (PROVENTIL  HFA;VENTOLIN  HFA) 108 (90 Base) MCG/ACT inhaler Inhale 1 puff into the lungs every 6 (six) hours as needed for wheezing or shortness of breath.   Yes [provider]  amoxicillin -clavulanate (AUGMENTIN ) 875-125 MG tablet Take 1 tablet by mouth 2 (two) times daily. Take 1 tablet (875 mg total) by mouth every 12 (twelve) hours for 7 days 05/26/23 06/02/23 Yes [provider]  apixaban  (ELIQUIS ) 5 MG TABS tablet Take 1 tablet (5 mg total) by mouth 2 (two) times daily. 12/15/22  Yes Bensimhon, Toribio SAUNDERS, MD  Ascorbic Acid  (VITAMIN C ) 1000 MG tablet Take 1,000 mg by mouth daily.   Yes [provider]  cyanocobalamin  2000 MCG tablet Take 2,000 mcg by mouth daily.   Yes [provider]  fluticasone  (FLONASE ) 50 MCG/ACT nasal spray Place 1 spray into both nostrils in the morning and at bedtime. 05/26/23  Yes [provider]  Magnesium  200 MG TABS Take 200 mg by mouth daily.   Yes [provider]  metoprolol  succinate (TOPROL -XL) 25 MG 24 hr tablet Take 25 mg by mouth daily. 11/25/21  Yes [provider]  Multiple Vitamins-Minerals (SUPER THERA VITE M PO) Take 1 tablet by mouth daily.   Yes [provider]   Naphazoline-Glycerin  (REDNESS RELIEF OP) Place 1 drop into both eyes 2 (two) times daily.   Yes [provider]  omeprazole (PRILOSEC) 20 MG capsule Take 20 mg by mouth daily.   Yes [provider]  promethazine-dextromethorphan  (PROMETHAZINE-DM) 6.25-15 MG/5ML syrup Take 5 mLs by mouth 4 (four) times daily as needed for cough. 05/26/23  Yes [provider]  pyridoxine  (B-6) 500 MG tablet Take 500 mg by mouth daily.   Yes [provider]  sacubitril -valsartan  (ENTRESTO ) 24-26 MG Take 1 tablet by mouth 2 (two) times daily.   Yes [provider]  spironolactone  (ALDACTONE ) 25 MG tablet Take 1 tablet (25 mg total) by mouth daily. 12/15/22 12/15/23 Yes Bensimhon, Toribio SAUNDERS, MD  torsemide  (DEMADEX ) 20 MG tablet Take 1 tablet (20 mg total) by mouth daily as needed. 12/15/22 12/15/23 Yes Bensimhon, Toribio SAUNDERS, MD  doxycycline  (VIBRA -TABS) 100 MG tablet Take 100 mg by mouth 2 (two) times daily. Patient not taking: Reported on 05/27/2023 05/26/23   [provider]  rosuvastatin  (CRESTOR ) 10 MG tablet Take 10 mg by mouth at bedtime.  Patient not taking: Reported on 05/27/2023 02/22/23   [provider]  sacubitril -valsartan  (ENTRESTO ) 49-51 MG Take 1 tablet by mouth 2 (two) times daily. Patient not taking: Reported on 05/27/2023 12/15/22   Bensimhon, Daniel R, MD    Inpatient Medications: Scheduled Meds:  amiodarone   150 mg Intravenous Once   apixaban   5 mg Oral BID   fluticasone   1 spray Each Nare Daily   furosemide   40 mg Intravenous BID   guaiFENesin   1,200 mg Oral BID   ipratropium-albuterol   3 mL Nebulization TID   magnesium  oxide  200 mg Oral Daily   metoprolol  succinate  50 mg Oral Daily   pantoprazole   40 mg Oral Daily   sacubitril -valsartan   1 tablet Oral BID   sodium chloride  flush  3 mL Intravenous Q12H   spironolactone   25 mg Oral Daily   Continuous Infusions:  sodium chloride      amiodarone      Followed by   amiodarone       azithromycin      cefTRIAXone  (ROCEPHIN )  IV     PRN Meds: sodium chloride , acetaminophen , albuterol , ondansetron  (ZOFRAN ) IV, mouth rinse, sodium chloride  flush  Allergies:   Allergies  Allergen Reactions   Shellfish Allergy Anaphylaxis   Rosuvastatin      Dizziness     Social History:   Social History   Socioeconomic History   Marital status: Married    Spouse name: Suzen   Number of children: 5   Years of education: Not on file   Highest education level: Not on file  Occupational History   Not on file  Tobacco Use   Smoking status: Never   Smokeless tobacco: Never   Tobacco comments:    Never smoke 06/21/22  Vaping Use   Vaping status: Never Used  Substance and Sexual Activity   Alcohol use: No   Drug use: Not Currently   Sexual activity: Yes    Partners: Female  Other Topics Concern   Not on file  Social History Narrative   Lives at home with spouse; 5 kids, 11 grandkids & 2 great grandkids   Social Drivers of Corporate Investment Banker Strain: Not on file  Food Insecurity: No Food Insecurity (05/28/2023)   Hunger Vital Sign    Worried About Running Out of Food in the Last Year: Never true    Ran Out of Food in the Last Year: Never true  Transportation Needs: No Transportation Needs (05/28/2023)   PRAPARE - Administrator, Civil Service (Medical): No    Lack of Transportation (Non-Medical): No  Physical Activity: Not on file  Stress: Not on file  Social Connections: Socially Integrated (05/28/2023)   Social Connection and Isolation Panel [NHANES]    Frequency of Communication with Friends and Family: More than three times a week    Frequency of Social Gatherings with Friends and Family: More than three times a week    Attends Religious Services: More than 4 times per year    Active Member of [employer] or Organizations: Yes    Attends Banker Meetings: 1 to 4 times per year    Marital Status: Married  Catering Manager Violence: Not At  Risk (05/28/2023)   Humiliation, Afraid, Rape, and Kick questionnaire    Fear of Current or Ex-Partner: No    Emotionally Abused: No    Physically Abused: No    Sexually Abused: No     Family History:   Family History  Problem Relation  Age of Onset   Hyperlipidemia Mother    Hypertension Father    Heart disease Father    Hyperlipidemia Brother    Diabetes Brother    Heart failure Neg Hx     ROS:  Review of Systems  Constitutional:  Positive for chills, fever and malaise/fatigue. Negative for diaphoresis and weight loss.  HENT:  Positive for congestion.   Eyes:  Negative for discharge and redness.  Respiratory:  Positive for cough and shortness of breath. Negative for hemoptysis, sputum production and wheezing.   Cardiovascular:  Positive for orthopnea and leg swelling. Negative for chest pain, palpitations, claudication and PND.  Gastrointestinal:  Negative for abdominal pain, blood in stool, heartburn, melena, nausea and vomiting.  Musculoskeletal:  Negative for falls and myalgias.  Skin:  Negative for rash.  Neurological:  Positive for weakness. Negative for dizziness, tingling, tremors, sensory change, speech change, focal weakness and loss of consciousness.  Endo/Heme/Allergies:  Does not bruise/bleed easily.  Psychiatric/Behavioral:  Negative for substance abuse. The patient is not nervous/anxious.   All other systems reviewed and are negative.     Physical Exam/Data:   Vitals:   05/27/23 2348 05/28/23 0317 05/28/23 0459 05/28/23 0901  BP: 102/71 101/71  109/63  Pulse: 83 81  92  Resp: 18 18  17   Temp: 98.3 F (36.8 C) 98.7 F (37.1 C)  98 F (36.7 C)  TempSrc: Oral Oral  Oral  SpO2: 94% 100%  98%  Weight:   85.9 kg     Intake/Output Summary (Last 24 hours) at 05/28/2023 1040 Last data filed at 05/28/2023 0400 Gross per 24 hour  Intake 480 ml  Output --  Net 480 ml   Filed Weights   05/27/23 1454 05/28/23 0459  Weight: 89.8 kg 85.9 kg   Body mass index is  28.78 kg/m.   Physical Exam: General: Well developed, well nourished, in no acute distress. Head: Normocephalic, atraumatic, sclera non-icteric, no xanthomas, nares without discharge.  Neck: Negative for carotid bruits. JVD elevated 8 to 10 cm. Lungs: Diminished breath sounds bilaterally with scattered rhonchi. Breathing is unlabored. Heart: Tachycardic, irregularly irregular with S1 S2. No murmurs, rubs, or gallops appreciated. Abdomen: Soft, non-tender, non-distended with normoactive bowel sounds. No hepatomegaly. No rebound/guarding. No obvious abdominal masses. Msk:  Strength and tone appear normal for age. Extremities: No clubbing or cyanosis.  Mild bilateral lower extremity edema. Distal pedal pulses are 2+ and equal bilaterally. Neuro: Alert and oriented X 3. No facial asymmetry. No focal deficit. Moves all extremities spontaneously. Psych:  Responds to questions appropriately with a normal affect.   EKG:  The EKG was personally reviewed and demonstrates: NSR, 92 bpm, right axis deviation, possible prior septal infarct, ventricular couplet, baseline wandering, nonspecific ST-T changes Telemetry:  Telemetry was personally reviewed and demonstrates: Sinus rhythm with frequent, brief paroxysms of A-fib with RVR with ventricular rates ranging from the 130s to 160s bpm, frequent ventricular ectopy and short ventricular runs  Weights: Filed Weights   05/27/23 1454 05/28/23 0459  Weight: 89.8 kg 85.9 kg    Relevant CV Studies:  TEE 05/24/2022: 1. Left ventricular ejection fraction, by estimation, is 20 to 25%. The  left ventricle has severely decreased function. The left ventricle  demonstrates global hypokinesis.   2. Right ventricular systolic function is moderately reduced. The right  ventricular size is normal.   3. Prominent pectinate muscles in LA appendage . Left atrial size was  mildly dilated. No left atrial/left atrial appendage thrombus was  detected.   4. Mild to  moderate mitral valve regurgitation.   5. The aortic valve is tricuspid. Aortic valve regurgitation is trivial.  __________  [facility] 02/10/2022: Conclusions: Mild, nonobstructive coronary artery disease with 10-20% stenosis involving the proximal LAD.  No significant disease involving ramus intermedius, LCx, or RCA. Moderately-severely elevated left heart filling pressures (PCWP 30 mmHg, LVEDP 40 mmHg).  Prominent V waves on PCWP tracing suggest moderate-severe mitral regurgitation. Severe pulmonary hypertension (mean PAP 50 mmHg, PVR 4.3 WU). Moderately elevated right heart filling pressure (mean RA 14 mmHg, RV EDP 13 mmHg). Mildly reduced Fick cardiac output/index (CO 4.7 L/min, CI 2.2 L/min/m^2).   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 Escalate goal-directed medical therapy as blood pressure and renal function allow.  Consultation with advanced heart failure team recommended (can be done on an inpatient or outpatient basis based on clinical course). Medical therapy and risk factor modification to prevent progression of mild CAD. Restart IV heparin  2 hours after Zephyr band has been removed.  Start apixaban  5 mg twice daily tomorrow morning if no evidence of bleeding/vascular injury. __________  CPX 03/22/2022: Conclusion: The interpretation of this test is limited due to submaximal effort during the exercise. Based on available data, exercise testing with gas exchange indicates a moderate functional impairment when compared to matched sedentary norms. There is a mild-moderate HF limitation with reduced VO2, O2 pulse, and evidence of ventilation-perfusion mismatch at submaximal exercise.   Attending: Submax exercise test. Based on avalabale data there was a moderate functional limitation due to HF and obesity.   Laboratory Data:  Chemistry Recent Labs  Lab 05/27/23 0908 05/28/23 0444  NA 133* 132*  K 4.6 3.8  CL  97* 98  CO2 21* 25  GLUCOSE 163* 107*  BUN 32* 33*  CREATININE 1.80* 1.46*  CALCIUM  9.0 8.2*  GFRNONAA 41* 52*  ANIONGAP 15 9    No results for input(s): PROT, ALBUMIN, AST, ALT, ALKPHOS, BILITOT in the last 168 hours. Hematology Recent Labs  Lab 05/27/23 0908  WBC 14.8*  RBC 5.38  HGB 14.7  HCT 45.0  MCV 83.6  MCH 27.3  MCHC 32.7  RDW 14.6  PLT 168   Cardiac EnzymesNo results for input(s): TROPONINI in the last 168 hours. No results for input(s): TROPIPOC in the last 168 hours.  BNP Recent Labs  Lab 05/27/23 0908  BNP >4,500.0*    DDimer No results for input(s): DDIMER in the last 168 hours.  Radiology/Studies:  DG Chest 2 View Result Date: 05/28/2023 IMPRESSION: Patchy airspace disease in the right mid lung appears minimally progressive in the interval with improvement in airspace disease seen in the left mid lung previously. Electronically Signed   By: Camellia Candle M.D.   On: 05/28/2023 07:41   CT CHEST WO CONTRAST Result Date: 05/27/2023 IMPRESSION: Findings consistent with multifocal pneumonia. Minimal pericardial effusion.  Mild cardiomegaly. Mild coronary artery calcifications are noted. Bilateral nonobstructive nephrolithiasis. Electronically Signed   By: Lynwood Landy Raddle M.D.   On: 05/27/2023 12:30   DG Chest 2 View Result Date: 05/27/2023 IMPRESSION: 1. Multifocal rounded and patchy opacities, right-greater-than-left, which may represent multifocal pneumonia or asymmetric pulmonary edema in the acute setting. Recommend repeat chest radiograph in 12 weeks to ensure resolution. 2. Similar cardiomegaly. Electronically Signed   By: Limin  Xu M.D.   On: 05/27/2023 09:33    Assessment and Plan:   1.  Acute hypoxic respiratory failure: -Multifactorial in  etiology including COVID, multifocal pneumonia, acute on chronic HFrEF, and exacerbated by frequent paroxysms of A-fib with RVR -Has been weaned to room air -Ongoing management of pneumonia and COVID  per primary service  2.  Acute on chronic HFrEF secondary to NICM: -Volume overloaded, likely exacerbated by dietary indiscretion and under usage of torsemide  -Agree with IV Lasix  40 mg twice daily, escalate as indicated/needed in an effort to achieve 1 to 2 L net negative per 24 hours -Obtain echo -Continue current GDMT including metoprolol , Entresto , and spironolactone  -Resume PTA Jardiance  prior to discharge -Anticipate cardiac MRI and genetic workup if EF not improved and outpatient follow-up  3.  PAF: -Likely exacerbated by volume overload and acute pulmonary illness -Status post prior ablation in 05/2022 -Previously on amiodarone , discontinued in 08/2022 -Patient with previous inappropriate ICD shocks in the setting of A-fib with RVR -Start IV amiodarone  in an effort to decrease A-fib with RVR and ventricular ectopy burden -Check TSH and LFT -Continue Toprol -XL, may need to consider transitioning to Lopressor  briefly, though in the context of his significant cardiomyopathy, we will continue Toprol -XL for now -PTA apixaban  5 mg twice daily  4.  Ventricular ectopy: -Metoprolol  and amiodarone  as above -Maintain potassium and magnesium  at goal 4.0 and 2.0 respectively  5.  Nonobstructive CAD with elevated high-sensitivity troponin: -No symptoms of chest pain -Mildly elevated and flat trending high-sensitivity troponin, likely reflective of supply/demand ischemia in the setting of acute pulmonary illness, volume overload, and A-fib with RVR with underlying renal dysfunction -Defer heparin  drip at this time -No current plans for inpatient invasive ischemic testing given recent cath showing nonobstructive disease, acute illness, and underlying renal dysfunction -PTA apixaban  as above  6.  CKD stage III: -Renal function appears stable and consistent with baseline -Close monitoring with diuresis        For questions or updates, please contact [facility] Please consult  [email or web address] for contact info under Cardiology/STEMI.   Signed, Bernardino Bring, PA-C [facility] Pager: [phone] 05/28/2023, 10:40 AM

## 2023-05-28 NOTE — Plan of Care (Signed)

## 2023-05-28 NOTE — Progress Notes (Signed)
 Progress Note   Patient: Jerry Howe FMW:[id number] DOB: 09-11-1955 DOA: 05/27/2023     1 DOS: the patient was seen and examined on 05/28/2023   Brief hospital course: No notes on file  Assessment and Plan: Multifocal pneumonia Coronavirus infection Sepsis without endorgan damage. Acute hypoxia. Sepsis evidenced by acute hypoxia and leukocytosis, source of infection is multifocal pneumonia. Pro cal 5.2. Continue IV Rocephin  and azithromycin  therapy. Continue breathing treatment DuoNebs, antitussive and as needed albuterol  Incentive spirometry and flutter valve Continue isolation protocol. Continue supportive care. Supplemental oxygen as needed to maintain saturation greater than 92%.  Acute on chronic systolic and diastolic CHF  BNP elevated >4500.  Chest x-ray evidence of asymmetric pulmonary edema.  CT evidence of multifocal pneumonia. Will continue IV Lasix  40 mg twice daily for one more day before transitioning to oral Torsemide  20mg  daily. Continue antibiotics for pneumonia. Monitor daily weight, strict input and output.  Elevated troponins Denied chest pain and EKG showed no acute ST changes.  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 Low suspicion for ACS.   Atrial fibrillation with RVR Ventricular ectopy. Cardiology recommended amiodarone  drip to decrease ventricular ectopy Continue telemetry. Continue with metoprolol  XL, Eliquis    CKD stage II Stable. Continue to monitor daily renal function, electrolytes he is on diuresis.  Hyponatremia-  In the setting of CHF. Continue to trend sodium.    Out of bed to chair. Incentive spirometry. Nursing supportive care. Fall, aspiration precautions. DVT prophylaxis   Code Status: Full Code  Subjective: Patient is seen and examined today morning. Sitting on edge of bed. His niece at bedside. Denies chest pain or shortness of breath. Does have productive cough. He  is not on supplemental o2.  Physical Exam: Vitals:   05/28/23 0317 05/28/23 0459 05/28/23 0901 05/28/23 1241  BP: 101/71  109/63 99/71  Pulse: 81  92 81  Resp: 18  17   Temp: 98.7 F (37.1 C)  98 F (36.7 C)   TempSrc: Oral  Oral   SpO2: 100%  98% 98%  Weight:  85.9 kg      General - Elderly African American male, mild distress due to cough. HEENT - PERRLA, EOMI, atraumatic head, non tender sinuses. Lung - Clear, basal rales, diffuse rhonchi, wheezes. Heart - S1, S2 heard, no murmurs, rubs, 1+ pedal edema. Abdomen - Soft, non tender no guarding, bowel sounds good Neuro - Alert, awake and oriented x 3, non focal exam. Skin - Warm and dry.  Data Reviewed:      Latest Ref Rng & Units 05/27/2023    9:08 AM 05/24/2022    5:30 AM 02/13/2022    4:10 AM  CBC  WBC 4.0 - 10.5 K/uL 14.8  8.8  10.8   Hemoglobin 13.0 - 17.0 g/dL 85.2  86.2  83.3   Hematocrit 39.0 - 52.0 % 45.0  41.2  49.4   Platelets 150 - 400 K/uL 168  165  234       Latest Ref Rng & Units 05/28/2023    4:44 AM 05/27/2023    9:08 AM 03/21/2023    4:02 PM  BMP  Glucose 70 - 99 mg/dL 892  836  897   BUN 8 - 23 mg/dL 33  32  21   Creatinine 0.61 - 1.24 mg/dL 8.53  8.19  8.57   Sodium 135 - 145 mmol/L 132  133  136   Potassium 3.5 - 5.1 mmol/L 3.8  4.6  4.4   Chloride 98 - 111 mmol/L 98  97  104   CO2 22 - 32 mmol/L 25  21  25    Calcium  8.9 - 10.3 mg/dL 8.2  9.0  9.1    DG Chest 2 View Result Date: 05/28/2023 CLINICAL DATA:  CHF.  Multifocal pneumonia. EXAM: CHEST - 2 VIEW COMPARISON:  05/27/2023 FINDINGS: The cardio pericardial silhouette is enlarged. Patchy airspace disease in the right mid lung appears minimally progressive in the interval with improvement in airspace disease seen in the left mid lung previously. No pleural effusion or evidence of pneumothorax. Left single lead pacer/AICD remains in place. Telemetry leads overlie the chest. IMPRESSION: Patchy airspace disease in the right mid lung appears minimally  progressive in the interval with improvement in airspace disease seen in the left mid lung previously. Electronically Signed   By: Camellia Candle M.D.   On: 05/28/2023 07:41   CT CHEST WO CONTRAST Result Date: 05/27/2023 CLINICAL DATA:  Shortness of breath. EXAM: CT CHEST WITHOUT CONTRAST TECHNIQUE: Multidetector CT imaging of the chest was performed following the standard protocol without IV contrast. RADIATION DOSE REDUCTION: This exam was performed according to the departmental dose-optimization program which includes automated exposure control, adjustment of the mA and/or kV according to patient size and/or use of iterative reconstruction technique. COMPARISON:  May 17, 2022. FINDINGS: Cardiovascular: Mild cardiomegaly. Minimal pericardial effusion. No evidence of thoracic aortic aneurysm. Coronary calcifications are noted. Mediastinum/Nodes: No enlarged mediastinal or axillary lymph nodes. Thyroid  gland, trachea, and esophagus demonstrate no significant findings. Lungs/Pleura: No pneumothorax or pleural effusion is noted. Multiple patchy airspace opacities are noted throughout both lungs most consistent with multifocal pneumonia. Upper Abdomen: Bilateral nonobstructive nephrolithiasis. Musculoskeletal: No chest wall mass or suspicious bone lesions identified. IMPRESSION: Findings consistent with multifocal pneumonia. Minimal pericardial effusion.  Mild cardiomegaly. Mild coronary artery calcifications are noted. Bilateral nonobstructive nephrolithiasis. Electronically Signed   By: Lynwood Landy Raddle M.D.   On: 05/27/2023 12:30   DG Chest 2 View Result Date: 05/27/2023 CLINICAL DATA:  Several day history of worsening shortness of breath EXAM: CHEST - 2 VIEW COMPARISON:  Chest radiograph dated 02/07/2022 FINDINGS: Lines/tubes: Left chest wall ICD lead projects over the right ventricle. Lungs: Well inflated lungs. Multifocal rounded and patchy opacities, right-greater-than-left. Pleura: No pneumothorax or  pleural effusion. Heart/mediastinum: Similar enlarged cardiomediastinal silhouette. Bones: No acute osseous abnormality. IMPRESSION: 1. Multifocal rounded and patchy opacities, right-greater-than-left, which may represent multifocal pneumonia or asymmetric pulmonary edema in the acute setting. Recommend repeat chest radiograph in 12 weeks to ensure resolution. 2. Similar cardiomegaly. Electronically Signed   By: Limin  Xu M.D.   On: 05/27/2023 09:33   Family Communication: Discussed with patient, wife they understand and agree. All questions answereed.  Disposition: Status is: Inpatient Remains inpatient appropriate because: CHF, Coronavirus infection, pneumonia  Planned Discharge Destination: Home with Home Health     Time spent: 40 minutes  Author: Concepcion Riser, MD 05/28/2023 1:04 PM Secure chat 7am to 7pm For on call review [email or web address].

## 2023-05-29 DIAGNOSIS — N1831 Chronic kidney disease, stage 3a: Secondary | ICD-10-CM

## 2023-05-29 DIAGNOSIS — I480 Paroxysmal atrial fibrillation: Secondary | ICD-10-CM | POA: Diagnosis not present

## 2023-05-29 DIAGNOSIS — I5043 Acute on chronic combined systolic (congestive) and diastolic (congestive) heart failure: Secondary | ICD-10-CM | POA: Diagnosis not present

## 2023-05-29 DIAGNOSIS — I5023 Acute on chronic systolic (congestive) heart failure: Secondary | ICD-10-CM | POA: Diagnosis not present

## 2023-05-29 DIAGNOSIS — I2510 Atherosclerotic heart disease of native coronary artery without angina pectoris: Secondary | ICD-10-CM

## 2023-05-29 DIAGNOSIS — R7989 Other specified abnormal findings of blood chemistry: Secondary | ICD-10-CM | POA: Diagnosis not present

## 2023-05-29 DIAGNOSIS — R0902 Hypoxemia: Secondary | ICD-10-CM | POA: Diagnosis not present

## 2023-05-29 DIAGNOSIS — B342 Coronavirus infection, unspecified: Secondary | ICD-10-CM | POA: Diagnosis not present

## 2023-05-29 DIAGNOSIS — J189 Pneumonia, unspecified organism: Secondary | ICD-10-CM | POA: Diagnosis not present

## 2023-05-29 LAB — BASIC METABOLIC PANEL
Anion gap: 18 — ABNORMAL HIGH (ref 5–15)
BUN: 33 mg/dL — ABNORMAL HIGH (ref 8–23)
CO2: 17 mmol/L — ABNORMAL LOW (ref 22–32)
Calcium: 8.5 mg/dL — ABNORMAL LOW (ref 8.9–10.3)
Chloride: 93 mmol/L — ABNORMAL LOW (ref 98–111)
Creatinine, Ser: 1.56 mg/dL — ABNORMAL HIGH (ref 0.61–1.24)
GFR, Estimated: 48 mL/min — ABNORMAL LOW (ref >60)
Glucose, Bld: 139 mg/dL — ABNORMAL HIGH (ref 70–99)
Potassium: 4.9 mmol/L (ref 3.5–5.1)
Sodium: 128 mmol/L — ABNORMAL LOW (ref 135–145)

## 2023-05-29 MED ORDER — SODIUM BICARBONATE 650 MG PO TABS
650.0000 mg | ORAL_TABLET | Freq: Two times a day (BID) | ORAL | Status: AC
  Administered 2023-05-29 (×2): 650 mg via ORAL
  Filled 2023-05-29 (×2): qty 1

## 2023-05-29 NOTE — Progress Notes (Signed)
 Progress Note   Patient: Jerry Howe FMW:[id number] DOB: 08/09/1955 DOA: 05/27/2023     2 DOS: the patient was seen and examined on 05/29/2023   Brief hospital course: No notes on file  Assessment and Plan: Multifocal pneumonia Coronavirus infection Sepsis without endorgan damage. Acute hypoxia. Sepsis evidenced by acute hypoxia and leukocytosis, source of infection is multifocal pneumonia. Pro cal 5.2. Continue IV Rocephin  and azithromycin  therapy. Continue breathing treatment DuoNebs, antitussive. Incentive spirometry and flutter valve Continue isolation protocol. Supplemental oxygen as needed to maintain saturation greater than 92%.  Acute on chronic systolic and diastolic CHF  BNP elevated >4500.  Chest x-ray evidence of asymmetric pulmonary edema.  CT evidence of multifocal pneumonia. Will continue IV Lasix  40 mg twice daily with plan to transition to oral Torsemide  20mg  daily from tomorrow. Continue metoprolol , Entresto , spironolactone . Will resume Jardiance  upon discharge. Monitor daily weight, strict input and output.  Elevated troponins Denied chest pain and EKG showed no acute ST changes.  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 Low suspicion for ACS.   Atrial fibrillation with RVR Ventricular ectopy. Continue amiodarone  drip as per cardiology recommendations. Continue telemetry. Continue with metoprolol  XL, Eliquis    CKD stage II Stable.  Creatinine stable around 1.5 Continue to monitor daily renal function, electrolytes he is on diuresis.  Hyponatremia-  In the setting of CHF.  Sodium 128 today. Low bicarb, will give sodium bicarb 650mg  tab bid for 2 days. Continue to trend sodium.    Out of bed to chair. Incentive spirometry. Nursing supportive care. Fall, aspiration precautions. DVT prophylaxis   Code Status: Full Code  Subjective: Patient is seen and examined today morning. Sitting on chair.  Denies chest pain or shortness of breath. Does have productive cough. He is off supplemental o2.  Physical Exam: Vitals:   05/29/23 0435 05/29/23 0516 05/29/23 0748 05/29/23 1220  BP: 114/82  110/74 100/68  Pulse: 71  72 65  Resp: 16  17 17   Temp: 98.1 F (36.7 C)  98.3 F (36.8 C) 98.2 F (36.8 C)  TempSrc:   Oral Oral  SpO2: 100%  100% 97%  Weight:  86.2 kg      General - Elderly African American male, no acute distress. HEENT - PERRLA, EOMI, atraumatic head, non tender sinuses. Lung - Clear, basal rales, diffuse rhonchi, wheezes. Heart - S1, S2 heard, no murmurs, rubs, 1+ pedal edema. Abdomen - Soft, non tender no guarding, bowel sounds good Neuro - Alert, awake and oriented x 3, non focal exam. Skin - Warm and dry.  Data Reviewed:      Latest Ref Rng & Units 05/27/2023    9:08 AM 05/24/2022    5:30 AM 02/13/2022    4:10 AM  CBC  WBC 4.0 - 10.5 K/uL 14.8  8.8  10.8   Hemoglobin 13.0 - 17.0 g/dL 85.2  86.2  83.3   Hematocrit 39.0 - 52.0 % 45.0  41.2  49.4   Platelets 150 - 400 K/uL 168  165  234       Latest Ref Rng & Units 05/29/2023    4:45 AM 05/28/2023    4:44 AM 05/27/2023    9:08 AM  BMP  Glucose 70 - 99 mg/dL 860  892  836   BUN 8 - 23 mg/dL 33  33  32   Creatinine 0.61 - 1.24 mg/dL 8.43  8.53  8.19   Sodium 135 - 145 mmol/L 128  132  133   Potassium 3.5 - 5.1 mmol/L 4.9  3.8  4.6   Chloride 98 - 111 mmol/L 93  98  97   CO2 22 - 32 mmol/L 17  25  21    Calcium  8.9 - 10.3 mg/dL 8.5  8.2  9.0    DG Chest 2 View Result Date: 05/28/2023 CLINICAL DATA:  CHF.  Multifocal pneumonia. EXAM: CHEST - 2 VIEW COMPARISON:  05/27/2023 FINDINGS: The cardio pericardial silhouette is enlarged. Patchy airspace disease in the right mid lung appears minimally progressive in the interval with improvement in airspace disease seen in the left mid lung previously. No pleural effusion or evidence of pneumothorax. Left single lead pacer/AICD remains in place. Telemetry leads overlie the  chest. IMPRESSION: Patchy airspace disease in the right mid lung appears minimally progressive in the interval with improvement in airspace disease seen in the left mid lung previously. Electronically Signed   By: Camellia Candle M.D.   On: 05/28/2023 07:41   Family Communication: Discussed with patient, wife they understand and agree. All questions answereed.  Disposition: Status is: Inpatient Remains inpatient appropriate because: CHF, Coronavirus infection, pneumonia, IV amiodarone , telemonitoring  Planned Discharge Destination: Home with Home Health     Time spent: 39 minutes  Author: Concepcion Riser, MD 05/29/2023 12:58 PM Secure chat 7am to 7pm For on call review [email or web address].

## 2023-05-29 NOTE — Progress Notes (Signed)
 Progress Note  Patient Name: Jerry Howe Date of Encounter: 05/29/2023  Primary Cardiologist: Perla Primary Electrophysiologist:  Cindie Advanced Heart Failure: Bensimhon  Subjective   No chest pain. Dyspnea much improved. No palpitations, dizziness, presyncope, or syncope.  No documented UOP over the past 24 hours. Weight trend 85.9 to 86.2 kg over the past 24 hours (standing scale). Reports vigorous UOP over the past 24 hours. Renal function largely stable with slight increase in SCr. Afib and PVC burden much improved on amiodarone  gtt.   Inpatient Medications    Scheduled Meds:  apixaban   5 mg Oral BID   doxycycline   100 mg Oral Q12H   fluticasone   1 spray Each Nare Daily   furosemide   40 mg Intravenous BID   guaiFENesin   1,200 mg Oral BID   magnesium  oxide  200 mg Oral Daily   metoprolol  succinate  50 mg Oral Daily   pantoprazole   40 mg Oral Daily   sacubitril -valsartan   1 tablet Oral BID   sodium chloride  flush  3 mL Intravenous Q12H   spironolactone   25 mg Oral Daily   Continuous Infusions:  amiodarone  30 mg/hr (05/29/23 0429)   cefTRIAXone  (ROCEPHIN )  IV Stopped (05/28/23 1128)   PRN Meds: acetaminophen , albuterol , benzonatate , ondansetron  (ZOFRAN ) IV, mouth rinse, sodium chloride  flush   Vital Signs    Vitals:   05/29/23 0106 05/29/23 0435 05/29/23 0516 05/29/23 0748  BP: 100/68 114/82  110/74  Pulse: 69 71  72  Resp: 20 16  17   Temp: 99.2 F (37.3 C) 98.1 F (36.7 C)  98.3 F (36.8 C)  TempSrc:    Oral  SpO2: 99% 100%  100%  Weight:   86.2 kg     Intake/Output Summary (Last 24 hours) at 05/29/2023 0852 Last data filed at 05/29/2023 0429 Gross per 24 hour  Intake 585.75 ml  Output --  Net 585.75 ml   Filed Weights   05/27/23 1454 05/28/23 0459 05/29/23 0516  Weight: 89.8 kg 85.9 kg 86.2 kg    Telemetry    SR, short atrial run around 03:00 with brief junctional escape rhythm following termination, much improved Afib and PVC burden - Personally  Reviewed  ECG    No new tracings - Personally Reviewed  Physical Exam   GEN: No acute distress.   Neck: JVD not elevated. Cardiac: RRR, no murmurs, rubs, or gallops.  Respiratory: Clear to auscultation bilaterally.  GI: Soft, nontender, non-distended.   MS: No edema; No deformity. Neuro:  Alert and oriented x 3; Nonfocal.  Psych: Normal affect.  Labs    Chemistry Recent Labs  Lab 05/27/23 0908 05/28/23 0442 05/28/23 0444 05/29/23 0445  NA 133*  --  132* 128*  K 4.6  --  3.8 4.9  CL 97*  --  98 93*  CO2 21*  --  25 17*  GLUCOSE 163*  --  107* 139*  BUN 32*  --  33* 33*  CREATININE 1.80*  --  1.46* 1.56*  CALCIUM  9.0  --  8.2* 8.5*  PROT  --  6.2*  --   --   ALBUMIN  --  3.0*  --   --   AST  --  63*  --   --   ALT  --  37  --   --   ALKPHOS  --  59  --   --   BILITOT  --  1.8*  --   --   GFRNONAA 41*  --  52*  48*  ANIONGAP 15  --  9 18*     Hematology Recent Labs  Lab 05/27/23 0908  WBC 14.8*  RBC 5.38  HGB 14.7  HCT 45.0  MCV 83.6  MCH 27.3  MCHC 32.7  RDW 14.6  PLT 168    Cardiac EnzymesNo results for input(s): TROPONINI in the last 168 hours. No results for input(s): TROPIPOC in the last 168 hours.   BNP Recent Labs  Lab 05/27/23 0908  BNP >4,500.0*     DDimer No results for input(s): DDIMER in the last 168 hours.   Radiology    DG Chest 2 View Result Date: 05/28/2023 IMPRESSION: Patchy airspace disease in the right mid lung appears minimally progressive in the interval with improvement in airspace disease seen in the left mid lung previously. Electronically Signed   By: Camellia Candle M.D.   On: 05/28/2023 07:41   CT CHEST WO CONTRAST Result Date: 05/27/2023 IMPRESSION: Findings consistent with multifocal pneumonia. Minimal pericardial effusion.  Mild cardiomegaly. Mild coronary artery calcifications are noted. Bilateral nonobstructive nephrolithiasis. Electronically Signed   By: Lynwood Landy Raddle M.D.   On: 05/27/2023 12:30   DG Chest  2 View Result Date: 05/27/2023 IMPRESSION: 1. Multifocal rounded and patchy opacities, right-greater-than-left, which may represent multifocal pneumonia or asymmetric pulmonary edema in the acute setting. Recommend repeat chest radiograph in 12 weeks to ensure resolution. 2. Similar cardiomegaly. Electronically Signed   By: Limin  Xu M.D.   On: 05/27/2023 09:33    Cardiac Studies   TEE 05/24/2022: 1. Left ventricular ejection fraction, by estimation, is 20 to 25%. The  left ventricle has severely decreased function. The left ventricle  demonstrates global hypokinesis.   2. Right ventricular systolic function is moderately reduced. The right  ventricular size is normal.   3. Prominent pectinate muscles in LA appendage . Left atrial size was  mildly dilated. No left atrial/left atrial appendage thrombus was  detected.   4. Mild to moderate mitral valve regurgitation.   5. The aortic valve is tricuspid. Aortic valve regurgitation is trivial.  __________   [facility] 02/10/2022: Conclusions: Mild, nonobstructive coronary artery disease with 10-20% stenosis involving the proximal LAD.  No significant disease involving ramus intermedius, LCx, or RCA. Moderately-severely elevated left heart filling pressures (PCWP 30 mmHg, LVEDP 40 mmHg).  Prominent V waves on PCWP tracing suggest moderate-severe mitral regurgitation. Severe pulmonary hypertension (mean PAP 50 mmHg, PVR 4.3 WU). Moderately elevated right heart filling pressure (mean RA 14 mmHg, RV EDP 13 mmHg). Mildly reduced Fick cardiac output/index (CO 4.7 L/min, CI 2.2 L/min/m^2).   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 Escalate goal-directed medical therapy as blood pressure and renal function allow.  Consultation with advanced heart failure team recommended (can be done on an inpatient or outpatient basis based on clinical course). Medical therapy and risk factor  modification to prevent progression of mild CAD. Restart IV heparin  2 hours after Zephyr band has been removed.  Start apixaban  5 mg twice daily tomorrow morning if no evidence of bleeding/vascular injury. __________   [facility] 03/22/2022: Conclusion: The interpretation of this test is limited due to submaximal effort during the exercise. Based on available data, exercise testing with gas exchange indicates a moderate functional impairment when compared to matched sedentary norms. There is a mild-moderate HF limitation with reduced VO2, O2 pulse, and evidence of ventilation-perfusion mismatch at submaximal exercise.   Attending: Submax exercise test. Based on avalabale data there  was a moderate functional limitation due to HF and obesity.   Patient Profile     68 y.o. male with history of HFrEF secondary to NICM, PAF status post ablation, CKD stage III, frequent PVCs, and HTN who is being seen today for the evaluation of acute on chronic HFrEF at the request of Dr. Darci.   Assessment & Plan    1. Acute hypoxic respiratory failure: -Improving  -Multifactorial in etiology including COVID, multifocal pneumonia, acute on chronic HFrEF, and exacerbated by frequent paroxysms of A-fib with RVR -Has been weaned to room air -Ongoing management of pneumonia and COVID per primary service   2.  Acute on chronic HFrEF secondary to NICM: -Volume improved, likely exacerbated by dietary indiscretion and under usage of torsemide  -Remains on IV Lasix  40 mg twice daily for now, can likely transition back to torsemide  on 2/10 -Ins and outs incomplete   -Continue current GDMT including metoprolol , Entresto , and spironolactone  -Resume PTA Jardiance  prior to discharge -Anticipate cardiac MRI and genetic workup if EF not improved and outpatient follow-up   3.  PAF: -Likely exacerbated by volume overload and acute pulmonary illness -Status post prior ablation in 05/2022 -Previously on amiodarone , discontinued in  08/2022 -Patient with previous inappropriate ICD shocks in the setting of A-fib with RVR -Restarted on IV amiodarone  on 2/8, in an effort to decrease A-fib with RVR and ventricular ectopy burden, much improved  -TSH suppressed with elevated free T4 -Mildly elevated AST -Continue Toprol -XL, may need to consider transitioning to Lopressor  briefly, though in the context of his significant cardiomyopathy, we will continue Toprol -XL for now -PTA apixaban  5 mg twice daily   4.  Ventricular ectopy: -Much improved burden -Metoprolol  and amiodarone  as above -Maintain potassium and magnesium  at goal 4.0 and 2.0 respectively   5.  Nonobstructive CAD with elevated high-sensitivity troponin: -No symptoms of chest pain -Mildly elevated and flat trending high-sensitivity troponin, likely reflective of supply/demand ischemia in the setting of acute pulmonary illness, volume overload, and A-fib with RVR with underlying renal dysfunction -Defer heparin  drip at this time -No current plans for inpatient invasive ischemic testing given recent cath showing nonobstructive disease, acute illness, and underlying renal dysfunction -PTA apixaban  as above   6.  CKD stage III: -Renal function appears stable and consistent with baseline -Close monitoring with diuresis    For questions or updates, please contact [facility] Please consult [email or web address] for contact info under Cardiology/STEMI.    Signed, Bernardino Bring, PA-C [facility] Pager: [phone] 05/29/2023, 8:52 AM

## 2023-05-29 NOTE — Plan of Care (Signed)

## 2023-05-30 ENCOUNTER — Telehealth (HOSPITAL_COMMUNITY): Payer: Self-pay

## 2023-05-30 ENCOUNTER — Other Ambulatory Visit (HOSPITAL_COMMUNITY): Payer: Self-pay

## 2023-05-30 ENCOUNTER — Other Ambulatory Visit: Payer: Self-pay

## 2023-05-30 DIAGNOSIS — I5043 Acute on chronic combined systolic (congestive) and diastolic (congestive) heart failure: Secondary | ICD-10-CM | POA: Diagnosis not present

## 2023-05-30 DIAGNOSIS — I480 Paroxysmal atrial fibrillation: Secondary | ICD-10-CM

## 2023-05-30 DIAGNOSIS — B342 Coronavirus infection, unspecified: Secondary | ICD-10-CM | POA: Diagnosis not present

## 2023-05-30 DIAGNOSIS — N1831 Chronic kidney disease, stage 3a: Secondary | ICD-10-CM

## 2023-05-30 DIAGNOSIS — I2510 Atherosclerotic heart disease of native coronary artery without angina pectoris: Secondary | ICD-10-CM | POA: Diagnosis not present

## 2023-05-30 DIAGNOSIS — J189 Pneumonia, unspecified organism: Secondary | ICD-10-CM | POA: Diagnosis not present

## 2023-05-30 DIAGNOSIS — I2583 Coronary atherosclerosis due to lipid rich plaque: Secondary | ICD-10-CM

## 2023-05-30 LAB — CBC
HCT: 39.6 % (ref 39.0–52.0)
Hemoglobin: 13.4 g/dL (ref 13.0–17.0)
MCH: 26.9 pg (ref 26.0–34.0)
MCHC: 33.8 g/dL (ref 30.0–36.0)
MCV: 79.5 fL — ABNORMAL LOW (ref 80.0–100.0)
Platelets: 161 10*3/uL (ref 150–400)
RBC: 4.98 MIL/uL (ref 4.22–5.81)
RDW: 14.3 % (ref 11.5–15.5)
WBC: 12.4 10*3/uL — ABNORMAL HIGH (ref 4.0–10.5)
nRBC: 0 % (ref 0.0–0.2)

## 2023-05-30 LAB — BASIC METABOLIC PANEL
Anion gap: 9 (ref 5–15)
BUN: 44 mg/dL — ABNORMAL HIGH (ref 8–23)
CO2: 26 mmol/L (ref 22–32)
Calcium: 8.4 mg/dL — ABNORMAL LOW (ref 8.9–10.3)
Chloride: 95 mmol/L — ABNORMAL LOW (ref 98–111)
Creatinine, Ser: 1.4 mg/dL — ABNORMAL HIGH (ref 0.61–1.24)
GFR, Estimated: 55 mL/min — ABNORMAL LOW (ref >60)
Glucose, Bld: 116 mg/dL — ABNORMAL HIGH (ref 70–99)
Potassium: 4 mmol/L (ref 3.5–5.1)
Sodium: 130 mmol/L — ABNORMAL LOW (ref 135–145)

## 2023-05-30 LAB — T3, FREE: T3, Free: 2.2 pg/mL (ref 2.0–4.4)

## 2023-05-30 MED ORDER — MENTHOL 3 MG MT LOZG
1.0000 | LOZENGE | OROMUCOSAL | 0 refills | Status: DC | PRN
  Filled 2023-05-30: qty 30, fill #0

## 2023-05-30 MED ORDER — EMPAGLIFLOZIN 10 MG PO TABS
10.0000 mg | ORAL_TABLET | Freq: Every day | ORAL | 2 refills | Status: DC
  Filled 2023-05-30: qty 30, 30d supply, fill #0

## 2023-05-30 MED ORDER — GUAIFENESIN ER 600 MG PO TB12
1200.0000 mg | ORAL_TABLET | Freq: Two times a day (BID) | ORAL | 0 refills | Status: DC
  Filled 2023-05-30: qty 20, 5d supply, fill #0

## 2023-05-30 MED ORDER — EMPAGLIFLOZIN 10 MG PO TABS: 10.0000 mg | ORAL_TABLET | Freq: Every day | ORAL | Status: DC

## 2023-05-30 MED ORDER — MENTHOL 3 MG MT LOZG
1.0000 | LOZENGE | OROMUCOSAL | Status: DC | PRN
  Filled 2023-05-30: qty 9

## 2023-05-30 MED ORDER — AMIODARONE HCL 200 MG PO TABS
200.0000 mg | ORAL_TABLET | Freq: Every day | ORAL | 2 refills | Status: DC
  Filled 2023-05-30: qty 30, 30d supply, fill #0

## 2023-05-30 NOTE — Progress Notes (Addendum)
 Transition of Care [facility]) - Inpatient Brief Assessment   Patient Details  Name: Jerry Howe MRN: [id number] Date of Birth: [birth year]  [facility] CM/SW Contact:    Pama Roskos C Reef Achterberg, RN Phone Number: 05/30/2023, 4:29 PM   Clinical Narrative: TOC continuing to follow patient's progress throughout discharge planning. Patient does not have a PCP. List added to AVS.    Transition of Care Asessment:   Patient has primary care physician: No   Prior level of function:: Independent Prior/Current Home Services: No current home services Social Drivers of Health Review: SDOH reviewed no interventions necessary Readmission risk has been reviewed: Yes Transition of care needs: no transition of care needs at this time

## 2023-05-30 NOTE — Care Management Important Message (Signed)
 Important Message  Patient Details  Name: Jerry Howe MRN: [id number] Date of Birth: 04-Feb-1956   Important Message Given:  Yes - Medicare IM     Felix Host 05/30/2023, 3:24 PM

## 2023-05-30 NOTE — Progress Notes (Signed)
 2/10 Patient under Airborne and Contact Precautions, IMM Letter given to RN assigned to patient to give to patient at bedside.

## 2023-05-30 NOTE — Progress Notes (Signed)
 Patient discharging home, all DC paperwork reviewed and sent with pt. Pt leaving with all personal belongings with spouse via private vehicle

## 2023-05-30 NOTE — Telephone Encounter (Signed)
 Pharmacy Patient Advocate Encounter  Insurance verification completed.    The patient is insured through [employer]. Patient has Medicare and is not eligible for a copay card, but may be able to apply for patient assistance or Medicare RX Payment Plan (Patient Must reach out to their plan, if eligible for payment plan), if available.    Ran test claim for Jerry Howe and the current 30 day co-pay is $225.62.  Ran test claim for Jardiance  and the current 30 day co-pay is $47.00.  Ran test claim for Xarelto and the current 30 day co-pay is $47.00.  Ran test claim for Entresto  and the current 30 day co-pay is $47.00.  This test claim was processed through [facility]- copay amounts may vary at other pharmacies due to pharmacy/plan contracts, or as the patient moves through the different stages of their insurance plan.

## 2023-05-30 NOTE — Discharge Summary (Signed)
Physician Discharge Summary   Patient: Jerry Howe MRN: [id number] DOB: [birth year]  Admit date:     05/27/2023  Discharge date: 05/30/23  Discharge Physician: Marcelino Duster   PCP: System, Provider Not In   Recommendations at discharge:   PCP follow-up in 1 week. Cardiology follow-up as scheduled.   Discharge Diagnoses: Principal Problem:   Acute on chronic combined systolic and diastolic CHF (congestive heart failure) (HCC) Active Problems:   PAF (paroxysmal atrial fibrillation) (HCC)   CAP (community acquired pneumonia)   Coronavirus infection   Coronary artery disease due to lipid rich plaque   CKD stage 3a, GFR 45-59 ml/min (HCC)   Low TSH level   Elevated serum free T4 level  Resolved Problems:   * No resolved hospital problems. *  Hospital Course: Jerry Howe is a 68 y.o. male with medical history significant of nonischemic cardiomyopathy, chronic HFrEF with LVEF 20-25%, PAF on Eliquis, AICD, HTN, HLD, CKD stage II, presented with increasing productive cough and shortness of breath.   Patient is admitted to the hospitalist service with impression of sepsis secondary to multifocal pneumonia, systolic CHF exacerbation.  Heart failure team and cardiology evaluated the patient.  Patient is started on amiodarone drip and later transitioned to oral therapy.  IV antibiotics changed to Augmentin.  He is off supplemental oxygen hemodynamically stable to be discharged home.  He understands to follow-up with PCP, cardiology and heart failure team upon discharge.  Patient understands and agrees with the discharge plan.  Assessment and Plan: Multifocal pneumonia Coronavirus infection Sepsis without endorgan damage. Acute hypoxia. Sepsis evidenced by acute hypoxia and leukocytosis, source of infection is multifocal pneumonia. Pro cal 5.2. IV Rocephin and azithromycin transition to Augmentin therapy Continue breathing treatment DuoNebs, antitussive. Encouraged incentive  spirometry and flutter valve   Acute on chronic systolic and diastolic CHF  BNP elevated >4500.  Chest x-ray evidence of asymmetric pulmonary edema.  CT evidence of multifocal pneumonia. Heart failure team evaluated him, advised amiodarone 200 mg daily. IV Lasix transitioned oral Torsemide 20mg  daily from tomorrow. Continue metoprolol, Entresto, spironolactone. Resumed Jardiance upon discharge. Patient is advised to follow-up with PCP, cardiology and heart failure team upon discharge as instructed.   Elevated troponins Denied chest pain and EKG showed no acute ST changes.  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 Low suspicion for ACS.   Atrial fibrillation with RVR Ventricular ectopy. Amiodarone drip stopped, amiodarone 200 mg daily ordered per heart failure MD. Continue with metoprolol XL, Eliquis   CKD stage II Stable.  Creatinine stable around 1.5 Continue to monitor daily renal function, electrolytes he is on diuresis.   Hyponatremia-  In the setting of CHF.  Sodium improved Continue to trend sodium as outpatient.          Consultants: Cardiology, heart failure Procedures performed: None Disposition: Home Diet recommendation:  Discharge Diet Orders (From admission, onward)     Start     Ordered   05/30/23 0000  Diet - low sodium heart healthy        05/30/23 1523           Cardiac diet DISCHARGE MEDICATION: Allergies as of 05/30/2023       Reactions   Shellfish Allergy Anaphylaxis   Rosuvastatin    Dizziness         Medication List     STOP taking these medications    doxycycline 100 MG tablet Commonly known as: VIBRA-TABS  TAKE these medications    albuterol 108 (90 Base) MCG/ACT inhaler Commonly known as: VENTOLIN HFA Inhale 1 puff into the lungs every 6 (six) hours as needed for wheezing or shortness of breath.   amiodarone 200 MG tablet Commonly known as: Pacerone Take 1  tablet (200 mg total) by mouth daily.   amoxicillin-clavulanate 875-125 MG tablet Commonly known as: AUGMENTIN Take 1 tablet by mouth 2 (two) times daily. Take 1 tablet (875 mg total) by mouth every 12 (twelve) hours for 7 days   apixaban 5 MG Tabs tablet Commonly known as: ELIQUIS Take 1 tablet (5 mg total) by mouth 2 (two) times daily.   cyanocobalamin 2000 MCG tablet Take 2,000 mcg by mouth daily.   Entresto 49-51 MG Generic drug: sacubitril-valsartan Take 1 tablet by mouth 2 (two) times daily. What changed: Another medication with the same name was removed. Continue taking this medication, and follow the directions you see here.   fluticasone 50 MCG/ACT nasal spray Commonly known as: FLONASE Place 1 spray into both nostrils in the morning and at bedtime.   Jardiance 10 MG Tabs tablet Generic drug: empagliflozin Take 1 tablet (10 mg total) by mouth daily. Start taking on: May 31, 2023   Magnesium 200 MG Tabs Take 200 mg by mouth daily.   menthol-cetylpyridinium 3 MG lozenge Commonly known as: CEPACOL Take 1 lozenge (3 mg total) by mouth as needed for sore throat.   metoprolol succinate 25 MG 24 hr tablet Commonly known as: TOPROL-XL Take 25 mg by mouth daily.   Mucinex 600 MG 12 hr tablet Generic drug: guaiFENesin Take 2 tablets (1,200 mg total) by mouth 2 (two) times daily.   omeprazole 20 MG capsule Commonly known as: PRILOSEC Take 20 mg by mouth daily.   promethazine-dextromethorphan 6.25-15 MG/5ML syrup Commonly known as: PROMETHAZINE-DM Take 5 mLs by mouth 4 (four) times daily as needed for cough.   pyridoxine 500 MG tablet Commonly known as: B-6 Take 500 mg by mouth daily.   REDNESS RELIEF OP Place 1 drop into both eyes 2 (two) times daily.   rosuvastatin 10 MG tablet Commonly known as: CRESTOR Take 10 mg by mouth at bedtime.   spironolactone 25 MG tablet Commonly known as: ALDACTONE Take 1 tablet (25 mg total) by mouth daily.   SUPER  THERA VITE M PO Take 1 tablet by mouth daily.   torsemide 20 MG tablet Commonly known as: DEMADEX Take 1 tablet (20 mg total) by mouth daily as needed.   vitamin C 1000 MG tablet Take 1,000 mg by mouth daily.        Follow-up Information     Bensimhon, Bevelyn Buckles, MD Follow up in 1 week(s).   Specialty: Cardiology Contact information: [address] [place] [address] [phone]                Discharge Exam: Ceasar Mons Weights   05/28/23 0459 05/29/23 0516 05/30/23 0500  Weight: 85.9 kg 86.2 kg 87.5 kg   General - Elderly African American male, no acute distress. HEENT - PERRLA, EOMI, atraumatic head, non tender sinuses. Lung - Clear, basal rales, diffuse rhonchi, wheezes. Heart - S1, S2 heard, no murmurs, rubs, 1+ pedal edema. Abdomen - Soft, non tender no guarding, bowel sounds good Neuro - Alert, awake and oriented x 3, non focal exam. Skin - Warm and dry.  Condition at discharge: stable  The results of significant diagnostics from this hospitalization (including imaging, microbiology, ancillary and laboratory) are  listed below for reference.   Imaging Studies: DG Chest 2 View Result Date: 05/28/2023 CLINICAL DATA:  CHF.  Multifocal pneumonia. EXAM: CHEST - 2 VIEW COMPARISON:  05/27/2023 FINDINGS: The cardio pericardial silhouette is enlarged. Patchy airspace disease in the right mid lung appears minimally progressive in the interval with improvement in airspace disease seen in the left mid lung previously. No pleural effusion or evidence of pneumothorax. Left single lead pacer/AICD remains in place. Telemetry leads overlie the chest. IMPRESSION: Patchy airspace disease in the right mid lung appears minimally progressive in the interval with improvement in airspace disease seen in the left mid lung previously. Electronically Signed   By: Kennith Center M.D.   On: 05/28/2023 07:41   CT CHEST WO CONTRAST Result Date: 05/27/2023 CLINICAL DATA:  Shortness  of breath. EXAM: CT CHEST WITHOUT CONTRAST TECHNIQUE: Multidetector CT imaging of the chest was performed following the standard protocol without IV contrast. RADIATION DOSE REDUCTION: This exam was performed according to the departmental dose-optimization program which includes automated exposure control, adjustment of the mA and/or kV according to patient size and/or use of iterative reconstruction technique. COMPARISON:  May 17, 2022. FINDINGS: Cardiovascular: Mild cardiomegaly. Minimal pericardial effusion. No evidence of thoracic aortic aneurysm. Coronary calcifications are noted. Mediastinum/Nodes: No enlarged mediastinal or axillary lymph nodes. Thyroid gland, trachea, and esophagus demonstrate no significant findings. Lungs/Pleura: No pneumothorax or pleural effusion is noted. Multiple patchy airspace opacities are noted throughout both lungs most consistent with multifocal pneumonia. Upper Abdomen: Bilateral nonobstructive nephrolithiasis. Musculoskeletal: No chest wall mass or suspicious bone lesions identified. IMPRESSION: Findings consistent with multifocal pneumonia. Minimal pericardial effusion.  Mild cardiomegaly. Mild coronary artery calcifications are noted. Bilateral nonobstructive nephrolithiasis. Electronically Signed   By: Lupita Raider M.D.   On: 05/27/2023 12:30   DG Chest 2 View Result Date: 05/27/2023 CLINICAL DATA:  Several day history of worsening shortness of breath EXAM: CHEST - 2 VIEW COMPARISON:  Chest radiograph dated 02/07/2022 FINDINGS: Lines/tubes: Left chest wall ICD lead projects over the right ventricle. Lungs: Well inflated lungs. Multifocal rounded and patchy opacities, right-greater-than-left. Pleura: No pneumothorax or pleural effusion. Heart/mediastinum: Similar enlarged cardiomediastinal silhouette. Bones: No acute osseous abnormality. IMPRESSION: 1. Multifocal rounded and patchy opacities, right-greater-than-left, which may represent multifocal pneumonia or  asymmetric pulmonary edema in the acute setting. Recommend repeat chest radiograph in 12 weeks to ensure resolution. 2. Similar cardiomegaly. Electronically Signed   By: Agustin Cree M.D.   On: 05/27/2023 09:33    Microbiology: Results for orders placed or performed during the hospital encounter of 05/27/23  Resp panel by RT-PCR (RSV, Flu A&B, Covid) Anterior Nasal Swab     Status: None   Collection Time: 05/27/23 10:33 AM   Specimen: Anterior Nasal Swab  Result Value Ref Range Status   SARS Coronavirus 2 by RT PCR NEGATIVE NEGATIVE Final    Comment: (NOTE) SARS-CoV-2 target nucleic acids are NOT DETECTED.  The SARS-CoV-2 RNA is generally detectable in upper respiratory specimens during the acute phase of infection. The lowest concentration of SARS-CoV-2 viral copies this assay can detect is 138 copies/mL. A negative result does not preclude SARS-Cov-2 infection and should not be used as the sole basis for treatment or other patient management decisions. A negative result may occur with  improper specimen collection/handling, submission of specimen other than nasopharyngeal swab, presence of viral mutation(s) within the areas targeted by this assay, and inadequate number of viral copies(<138 copies/mL). A negative result must be combined with clinical observations, patient  history, and epidemiological information. The expected result is Negative.  Fact Sheet for Patients:  BloggerCourse.com  Fact Sheet for Healthcare Providers:  SeriousBroker.it  This test is no t yet approved or cleared by the [place] FDA and  has been authorized for detection and/or diagnosis of SARS-CoV-2 by FDA under an Emergency Use Authorization (EUA). This EUA will remain  in effect (meaning this test can be used) for the duration of the COVID-19 declaration under Section 564(b)(1) of the Act, 21 U.S.C.section 360bbb-3(b)(1), unless the authorization is  terminated  or revoked sooner.       Influenza A by PCR NEGATIVE NEGATIVE Final   Influenza B by PCR NEGATIVE NEGATIVE Final    Comment: (NOTE) The Xpert Xpress SARS-CoV-2/FLU/RSV plus assay is intended as an aid in the diagnosis of influenza from Nasopharyngeal swab specimens and should not be used as a sole basis for treatment. Nasal washings and aspirates are unacceptable for Xpert Xpress SARS-CoV-2/FLU/RSV testing.  Fact Sheet for Patients: BloggerCourse.com  Fact Sheet for Healthcare Providers: SeriousBroker.it  This test is not yet approved or cleared by the [place] FDA and has been authorized for detection and/or diagnosis of SARS-CoV-2 by FDA under an Emergency Use Authorization (EUA). This EUA will remain in effect (meaning this test can be used) for the duration of the COVID-19 declaration under Section 564(b)(1) of the Act, 21 U.S.C. section 360bbb-3(b)(1), unless the authorization is terminated or revoked.     Resp Syncytial Virus by PCR NEGATIVE NEGATIVE Final    Comment: (NOTE) Fact Sheet for Patients: BloggerCourse.com  Fact Sheet for Healthcare Providers: SeriousBroker.it  This test is not yet approved or cleared by the [place] FDA and has been authorized for detection and/or diagnosis of SARS-CoV-2 by FDA under an Emergency Use Authorization (EUA). This EUA will remain in effect (meaning this test can be used) for the duration of the COVID-19 declaration under Section 564(b)(1) of the Act, 21 U.S.C. section 360bbb-3(b)(1), unless the authorization is terminated or revoked.  Performed at [facility], [address] [place], [place] [address]   Respiratory (~20 pathogens) panel by PCR     Status: Abnormal   Collection Time: 05/27/23 12:31 PM   Specimen: Anterior Nasal Swab; Respiratory  Result Value Ref Range Status   Adenovirus  NOT DETECTED NOT DETECTED Final   Coronavirus 229E NOT DETECTED NOT DETECTED Final    Comment: (NOTE) The Coronavirus on the Respiratory Panel, DOES NOT test for the novel  Coronavirus (2019 nCoV)    Coronavirus HKU1 NOT DETECTED NOT DETECTED Final   Coronavirus NL63 NOT DETECTED NOT DETECTED Final   Coronavirus OC43 DETECTED (A) NOT DETECTED Final   Metapneumovirus NOT DETECTED NOT DETECTED Final   Rhinovirus / Enterovirus NOT DETECTED NOT DETECTED Final   Influenza A NOT DETECTED NOT DETECTED Final   Influenza B NOT DETECTED NOT DETECTED Final   Parainfluenza Virus 1 NOT DETECTED NOT DETECTED Final   Parainfluenza Virus 2 NOT DETECTED NOT DETECTED Final   Parainfluenza Virus 3 NOT DETECTED NOT DETECTED Final   Parainfluenza Virus 4 NOT DETECTED NOT DETECTED Final   Respiratory Syncytial Virus NOT DETECTED NOT DETECTED Final   Bordetella pertussis NOT DETECTED NOT DETECTED Final   Bordetella Parapertussis NOT DETECTED NOT DETECTED Final   Chlamydophila pneumoniae NOT DETECTED NOT DETECTED Final   Mycoplasma pneumoniae NOT DETECTED NOT DETECTED Final    Comment: Performed at [facility], [address] [place], [place] [address]  Labs: CBC: Recent Labs  Lab 05/27/23 0908 05/30/23 0522  WBC 14.8* 12.4*  HGB 14.7 13.4  HCT 45.0 39.6  MCV 83.6 79.5*  PLT 168 161   Basic Metabolic Panel: Recent Labs  Lab 05/27/23 0908 05/27/23 2148 05/28/23 0444 05/29/23 0445 05/30/23 0522  NA 133*  --  132* 128* 130*  K 4.6  --  3.8 4.9 4.0  CL 97*  --  98 93* 95*  CO2 21*  --  25 17* 26  GLUCOSE 163*  --  107* 139* 116*  BUN 32*  --  33* 33* 44*  CREATININE 1.80*  --  1.46* 1.56* 1.40*  CALCIUM 9.0  --  8.2* 8.5* 8.4*  MG  --  1.9  --   --   --    Liver Function Tests: Recent Labs  Lab 05/28/23 0442  AST 63*  ALT 37  ALKPHOS 59  BILITOT 1.8*  PROT 6.2*  ALBUMIN 3.0*   CBG: No results for input(s): "GLUCAP" in the last 168 hours.  Discharge time spent: 35  minutes.  Signed: Marcelino Duster, MD [facility] 05/30/2023

## 2023-05-30 NOTE — Consult Note (Addendum)
 Advanced Heart Failure Team Consult Note   Primary Physician: System, Provider Not In Cardiologist:  Timothy Gollan, MD  Reason for Consultation: A/c HFrEF  HPI:    Jerry Howe is seen today for evaluation of a/c HFrEF at the request of Dr. Butch Cashing.   Jerry Howe is a 68 y/o male with systolic HF due to NICM, PAF, HTN.    Jerry Howe.  LHC at that time revealed minimal CAD.  LVEF was 25%.  He was followed by [facility] clinic and recently transferred to [facility] heart care.  He established with Dr. Gollan on 12/2021 and Dr. Marven Slimmer on 01/2022.  He reported NYHA Class II symptoms and lisinopril  switch to Entresto .  He remained on spironolactone  and metoprolol .  He established with Dr. Marven Slimmer on 01/2022 he was stable and doing well.     He presented to the ED  10/22 after his defibrillator fired 6 times at [employer] after he got into a heated discussion.  In the ED he was noted to be in atrial fibrillation.  He converted on his own. Seen by Dr. Rodolfo Clan and ICD interogation showed inappropriate ICD firing for AF. Started on amio. Also found to have frequent PVCs.     Had AF ablation on 05/24/22. TEE at that time EF 20-25% Mild to moderate MR  Presented to the ED 2 days ago with cough and SOB. Found to be covid +. Admission labs reviewed: SCr 1.8, BNP 4.5L, CXR showed CHF vs PNA. HsTrop 136>185. Now on amiodarone  gtt. Diuresed with IV lasix . Started on rocephin  and doxy.   Feels good today. Sitting on EOB eating breakfast.   Cardiac studies reviewed:  Echo 3/20 EF 25-30% LVIDD 7.6 Echo 02/09/22  EF 20-25% RV ok  LVIDD 7.5 Cath 02/10/22: LAD 10-20%prox. RCA and LCX ok , RA 14, RV 90/13, PA 90/30 (50), PCWP 30 with v-waves 45, Ao sat 96%, PA sat 67%, Fick 4.7/2.2, PVR 4.3 WU    CPX 03/22/22  Resting HR: 67 Standing HR: 67 Peak HR: 123   (80% age predicted max HR)  BP rest: 118/82 BP standing: 118/78 BP peak: 194/82  Peak VO2: 13.9 (47.9% predicted peak VO2)   VE/VCO2 slope:  36.9  OUES: 1.58  Peak RER: 0.96  VE/MVV:  43.4%  O2pulse:  43mL/beat   (69% predicted O2pulse)   Home Medications Prior to Admission medications   Medication Sig Start Date End Date Taking? Authorizing Provider  albuterol  (PROVENTIL  HFA;VENTOLIN  HFA) 108 (90 Base) MCG/ACT inhaler Inhale 1 puff into the lungs every 6 (six) hours as needed for wheezing or shortness of breath.   Yes [provider]  amoxicillin-clavulanate (AUGMENTIN) 875-125 MG tablet Take 1 tablet by mouth 2 (two) times daily. Take 1 tablet (875 mg total) by mouth every 12 (twelve) hours for 7 days 05/26/23 06/02/23 Yes [provider]  apixaban  (ELIQUIS ) 5 MG TABS tablet Take 1 tablet (5 mg total) by mouth 2 (two) times daily. 12/15/22  Yes Zniyah Midkiff, Rheta Celestine, MD  Ascorbic Acid  (VITAMIN C ) 1000 MG tablet Take 1,000 mg by mouth daily.   Yes [provider]  cyanocobalamin  2000 MCG tablet Take 2,000 mcg by mouth daily.   Yes [provider]  fluticasone  (FLONASE ) 50 MCG/ACT nasal spray Place 1 spray into both nostrils in the morning and at bedtime. 05/26/23  Yes [provider]  Magnesium  200 MG TABS Take 200 mg by mouth daily.  Yes [provider]  metoprolol  succinate (TOPROL -XL) 25 MG 24 hr tablet Take 25 mg by mouth daily. 11/25/21  Yes [provider]  Multiple Vitamins-Minerals (SUPER THERA VITE M PO) Take 1 tablet by mouth daily.   Yes [provider]  Naphazoline-Glycerin (REDNESS RELIEF OP) Place 1 drop into both eyes 2 (two) times daily.   Yes [provider]  omeprazole (PRILOSEC) 20 MG capsule Take 20 mg by mouth daily.   Yes [provider]  promethazine-dextromethorphan (PROMETHAZINE-DM) 6.25-15 MG/5ML syrup Take 5 mLs by mouth 4 (four) times daily as needed for cough. 05/26/23  Yes [provider]  pyridoxine  (B-6) 500 MG tablet Take 500 mg by mouth daily.   Yes [provider]   sacubitril -valsartan  (ENTRESTO ) 24-26 MG Take 1 tablet by mouth 2 (two) times daily.   Yes [provider]  spironolactone  (ALDACTONE ) 25 MG tablet Take 1 tablet (25 mg total) by mouth daily. 12/15/22 12/15/23 Yes Mohamad Bruso, Rheta Celestine, MD  torsemide  (DEMADEX ) 20 MG tablet Take 1 tablet (20 mg total) by mouth daily as needed. 12/15/22 12/15/23 Yes Phu Record, Rheta Celestine, MD  doxycycline  (VIBRA -TABS) 100 MG tablet Take 100 mg by mouth 2 (two) times daily. Patient not taking: Reported on 05/27/2023 05/26/23   [provider]  rosuvastatin  (CRESTOR ) 10 MG tablet Take 10 mg by mouth at bedtime. Patient not taking: Reported on 05/27/2023 02/22/23   [provider]  sacubitril -valsartan  (ENTRESTO ) 49-51 MG Take 1 tablet by mouth 2 (two) times daily. Patient not taking: Reported on 05/27/2023 12/15/22   Aparna Vanderweele, Rheta Celestine, MD    Past Medical History: Past Medical History:  Diagnosis Date   Anxiety    Arrhythmia    CHF (congestive heart failure) (HCC)    Hypertension    Kidney stones    Shortness of breath dyspnea     Past Surgical History: Past Surgical History:  Procedure Laterality Date   ATRIAL FIBRILLATION ABLATION N/A 05/24/2022   Procedure: ATRIAL FIBRILLATION ABLATION;  Surgeon: Boyce Byes, MD;  Location: [facility] INVASIVE CV LAB;  Service: Cardiovascular;  Laterality: N/A;   CARDIAC CATHETERIZATION Right 8/3/Howe   Procedure: Left Heart Cath and Coronary Angiography;  Surgeon: Percival Brace, MD;  Location: [facility] INVASIVE CV LAB;  Service: Cardiovascular;  Laterality: Right;   IMPLANTABLE CARDIOVERTER DEFIBRILLATOR (ICD) GENERATOR CHANGE Left 08/15/2015   Procedure: ICD IMPLANT, single chamber;  Surgeon: Jerrie Morales, MD;  Location: [facility] ORS;  Service: Cardiovascular;  Laterality: Left;   KNEE SURGERY     RIGHT/LEFT HEART CATH AND CORONARY ANGIOGRAPHY N/A 02/10/2022   Procedure: RIGHT/LEFT HEART CATH AND CORONARY ANGIOGRAPHY;  Surgeon: Sammy Crisp, MD;   Location: [facility] INVASIVE CV LAB;  Service: Cardiovascular;  Laterality: N/A;   TEE WITHOUT CARDIOVERSION N/A 05/24/2022   Procedure: TRANSESOPHAGEAL ECHOCARDIOGRAM;  Surgeon: Boyce Byes, MD;  Location: [facility] INVASIVE CV LAB;  Service: Cardiovascular;  Laterality: N/A;    Family History: Family History  Problem Relation Age of Onset   Hyperlipidemia Mother    Hypertension Father    Heart disease Father    Hyperlipidemia Brother    Diabetes Brother    Heart failure Neg Hx     Social History: Social History   Socioeconomic History   Marital status: Married    Spouse name: Jullie Oiler   Number of children: 5   Years of education: Not on file   Highest education level: Not on file  Occupational History   Not on file  Tobacco Use  Smoking status: Never   Smokeless tobacco: Never   Tobacco comments:    Never smoke 06/21/22  Vaping Use   Vaping status: Never Used  Substance and Sexual Activity   Alcohol use: No   Drug use: Not Currently   Sexual activity: Yes    Partners: Female  Other Topics Concern   Not on file  Social History Narrative   Lives at home with spouse; 5 kids, 11 grandkids & 2 great grandkids   Social Drivers of Corporate investment banker Strain: Not on file  Food Insecurity: No Food Insecurity (05/28/2023)   Hunger Vital Sign    Worried About Running Out of Food in the Last Year: Never true    Ran Out of Food in the Last Year: Never true  Transportation Needs: No Transportation Needs (05/28/2023)   PRAPARE - Administrator, Civil Service (Medical): No    Lack of Transportation (Non-Medical): No  Physical Activity: Not on file  Stress: Not on file  Social Connections: Socially Integrated (05/28/2023)   Social Connection and Isolation Panel [NHANES]    Frequency of Communication with Friends and Family: More than three times a week    Frequency of Social Gatherings with Friends and Family: More than three times a week    Attends Religious  Services: More than 4 times per year    Active Member of [employer] or Organizations: Yes    Attends Banker Meetings: 1 to 4 times per year    Marital Status: Married    Allergies:  Allergies  Allergen Reactions   Shellfish Allergy Anaphylaxis   Rosuvastatin      Dizziness     Objective:    Vital Signs:   Temp:  [97.7 F (36.5 C)-98.3 F (36.8 C)] 98.3 F (36.8 C) (02/10 0541) Pulse Rate:  [54-65] 54 (02/10 0541) Resp:  [17-18] 18 (02/10 0541) BP: (88-104)/(60-74) 99/65 (02/10 0541) SpO2:  [97 %-100 %] 100 % (02/10 0541) Weight:  [87.5 kg] 87.5 kg (02/10 0500) Last BM Date : 05/28/23  Weight change: Filed Weights   05/28/23 0459 05/29/23 0516 05/30/23 0500  Weight: 85.9 kg 86.2 kg 87.5 kg    Intake/Output:   Intake/Output Summary (Last 24 hours) at 05/30/2023 0829 Last data filed at 05/30/2023 0500 Gross per 24 hour  Intake 1107.91 ml  Output 800 ml  Net 307.91 ml    Physical Exam  General:  well appearing.  No respiratory difficulty HEENT: normal Neck: supple. JVD flat. Carotids 2+ bilat; no bruits. No lymphadenopathy or thyromegaly appreciated. Cor: PMI nondisplaced. Regular rate & rhythm. No rubs, gallops or murmurs. Lungs: clear Abdomen: soft, nontender, nondistended. No hepatosplenomegaly. No bruits or masses. Good bowel sounds. Extremities: no cyanosis, clubbing, rash, edema  Neuro: alert & oriented x 3, cranial nerves grossly intact. moves all 4 extremities w/o difficulty. Affect pleasant.   Telemetry   NSR-SB 50s-60s (Personally reviewed)    EKG    NSR with PVCs 90s  Labs   Basic Metabolic Panel: Recent Labs  Lab 05/27/23 0908 05/27/23 2148 05/28/23 0444 05/29/23 0445 05/30/23 0522  NA 133*  --  132* 128* 130*  K 4.6  --  3.8 4.9 4.0  CL 97*  --  98 93* 95*  CO2 21*  --  25 17* 26  GLUCOSE 163*  --  107* 139* 116*  BUN 32*  --  33* 33* 44*  CREATININE 1.80*  --  1.46* 1.56* 1.40*  CALCIUM  9.0  --  8.2* 8.5* 8.4*  MG  --  1.9   --   --   --     Liver Function Tests: Recent Labs  Lab 05/28/23 0442  AST 63*  ALT 37  ALKPHOS 59  BILITOT 1.8*  PROT 6.2*  ALBUMIN 3.0*   No results for input(s): "LIPASE", "AMYLASE" in the last 168 hours. No results for input(s): "AMMONIA" in the last 168 hours.  CBC: Recent Labs  Lab 05/27/23 0908 05/30/23 0522  WBC 14.8* 12.4*  HGB 14.7 13.4  HCT 45.0 39.6  MCV 83.6 79.5*  PLT 168 161    Cardiac Enzymes: No results for input(s): "CKTOTAL", "CKMB", "CKMBINDEX", "TROPONINI" in the last 168 hours.  BNP: BNP (last 3 results) Recent Labs    12/15/22 1459 03/21/23 1602 05/27/23 0908  BNP 159.9* 532.5* >4,500.0*    ProBNP (last 3 results) No results for input(s): "PROBNP" in the last 8760 hours.   CBG: No results for input(s): "GLUCAP" in the last 168 hours.  Coagulation Studies: Recent Labs    05/27/23 1231  LABPROT 25.6*  INR 2.3*     Imaging   No results found.   Medications:     Current Medications:  apixaban   5 mg Oral BID   doxycycline   100 mg Oral Q12H   fluticasone   1 spray Each Nare Daily   furosemide   40 mg Intravenous BID   guaiFENesin   1,200 mg Oral BID   magnesium  oxide  200 mg Oral Daily   metoprolol  succinate  50 mg Oral Daily   pantoprazole   40 mg Oral Daily   sacubitril -valsartan   1 tablet Oral BID   sodium chloride  flush  3 mL Intravenous Q12H   spironolactone   25 mg Oral Daily    Infusions:  amiodarone  30 mg/hr (05/30/23 0537)   cefTRIAXone  (ROCEPHIN )  IV 1 g (05/29/23 0918)      Patient Profile   Jerry Howe is a 68 y/o male with systolic HF due to NICM, PAF, HTN. Admitted with covid and HFrEF exacerbation.   Assessment/Plan  1. Acute on chronic systolic HF - due to NICM. Unclear etiology. Has not had cMRI due to ICD. - Echo Howe EF 25% - Echo 3/20 EF 25-30% LVIDD 7.6 - Echo 02/09/22  EF 20-25% RV ok  LVIDD 7.5 G3DD - s/p MDT ICD - Cath 02/10/22 with minimal CAD (LAD 10-20%) markedly elevated  filling pressures, severe pulmonary venous HTN and moderately reduced CO in setting of restrictive physiology - CPX 03/22/22 pRER 0.96  pVO2: 13.9 (47.9%) VE/VCO2 slope:  36.9 OUES: 1.58  VE/MVV:  43.4%  - TEE 05/24/22 EF 25-30% RV moderately decreased - TTR panel 8/24, need to resend. Genetic testing negative for acute findings - NYHA IV on admission, suspect symptoms primarily d/t covid pna.  - Volume stable. Ok for Torsemide  20 mg daily PRN - Continue Entresto   24-26 mg BID - Continue Spiro 25 - Continue Toprol  50 mg daily - Restart Jardiance  10 mg daily tomorrow - Update echo - CPX testing was submax test with moderate HF limitation. Not ready for advanced therapies at this time. Follow closely   2. PAF - now s/p ICD inappropriate shock x 6 in 10/23 - s/p AF ablation 05/24/22 - in SB/NSR today  - Stop IV amio, will not start PO with bradycardia - continue Eliquis .   3. Frequent PVCs  - suppressed on amio  - follow   4. CKD 3a-3b  - baseline SCr 1.3-1.8 - Last SCr  1.4 - Restart Jardiance  tomorrow - Check labs today  5. Covid/PNA - per primary team - Continue rocephin  and doxy  Will arrange f/u in AHF clinic. Stable for discharge today on current regimen.   Length of Stay: 3  Sheryl Donna, NP  05/30/2023, 8:29 AM  Advanced Heart Failure Team Pager [phone] (M-F; 7a - 5p)  Please contact [facility] for night-coverage after hours (4p -7a ) and weekends on [email or web address]  Patient seen and examined with the above-signed Advanced Practice Provider and/or Housestaff. I personally reviewed laboratory data, imaging studies and relevant notes. I independently examined the patient and formulated the important aspects of the plan. I have edited the note to reflect any of my changes or salient points. I have personally discussed the plan with the patient and/or family.  68 y/o male well known to me from HF Clinic  Has h/o HFrEF  25-30% due to NICM   At baseline NYHA I-II  Admitted  with respiratory distress due to COVID-19 infection +/- ADHF  Now feeling much better. Has diuresed well. Denies SOB. Eager to go home  General:  Sitting up in chair  No resp difficulty HEENT: normal Neck: supple. no JVD. Carotids 2+ bilat; no bruits. No lymphadenopathy or thryomegaly appreciated. Cor: PMI nondisplaced. Regular rate & rhythm. No rubs, gallops or murmurs. Lungs: clear Abdomen: soft, nontender, nondistended. No hepatosplenomegaly. No bruits or masses. Good bowel sounds. Extremities: no cyanosis, clubbing, rash, edema Neuro: alert & orientedx3, cranial nerves grossly intact. moves all 4 extremities w/o difficulty. Affect pleasant  His HF currently looks well compensated - perhaps even a little dry. Remains in NSR on IV amio.   He is stable for d/c from a cardiac status. Resume home cardiac meds including amio 200 daily  Echo ordered prior to d/c but can do as outpatient if needed.   Jules Oar, MD  9:19 AM

## 2023-05-30 NOTE — Plan of Care (Signed)

## 2023-05-31 LAB — MYCOPLASMA PNEUMONIAE ANTIBODY, IGM: Mycoplasma pneumo IgM: <770 U/mL (ref 0–769)

## 2023-06-01 NOTE — Progress Notes (Deleted)
 Electrophysiology Clinic Note    Date:  05/26/2023  Patient ID:  Jerry Howe, Jerry Howe [birth year], MRN [id number] PCP:  Enid Baas, MD  Cardiologist:  Julien Nordmann, MD HF Cardiologist: Bensimhon Electrophysiologist: Lanier Prude, MD  ***refresh  Discussed the use of AI scribe software for clinical note transcription with the patient, who gave verbal consent to proceed.   Patient Profile    Chief Complaint: ***  History of Present Illness: Jerry Howe is a 68 y.o. male with PMH notable for parox AFib, HFrEF, NICM, HTN, CKD-3; seen today for Lanier Prude, MD for routine electrophysiology followup.  He is s/p AF ablation with PVI and posterior walls on 2/5/204.  He last saw Dr. Lalla Brothers 08/2022 where he was maintaining sinus rhtyhm. Amiodarone was stopped at that time. He last saw Dr. Gala Romney 03/2023, planning for updated TTE followed by genetic and cMRI if EF not improved.   On follow-up today *** AF burden, symptoms *** palpitations *** bleeding concerns  *** fluid status, exercise tolerance  Since last being seen in our clinic the patient reports doing ***.  he denies chest pain, palpitations, dyspnea, PND, orthopnea, nausea, vomiting, dizziness, syncope, edema, weight gain, or early satiety.     Device Information: MDT single chamber ICD, imp 07/2015  AAD History: Amiodarone, stopped 08/2022 d/t no afib    ROS:  Please see the history of present illness. All other systems are reviewed and otherwise negative.    Physical Exam    VS:  There were no vitals taken for this visit. BMI: There is no height or weight on file to calculate BMI.  Wt Readings from Last 3 Encounters:  03/21/23 207 lb (93.9 kg)  12/15/22 203 lb (92.1 kg)  09/08/22 206 lb 8 oz (93.7 kg)     GEN- The patient is well appearing, alert and oriented x 3 today.   Lungs- Clear to ausculation bilaterally, normal work of breathing.  Heart- {Blank  single:19197::"Regular","Irregularly irregular"} rate and rhythm, no murmurs, rubs or gallops Extremities- {EDEMA LEVEL:28147::"No"} peripheral edema, warm, dry Skin-  *** device pocket well-healed, no tethering   Device interrogation done today and reviewed by myself:  Battery *** Lead thresholds, impedence, sensing stable *** *** episodes *** changes made today   Studies Reviewed   Previous EP, cardiology notes.    EKG is ordered. Personal review of EKG from today shows:  ***       TEE, 05/24/2022  1. Left ventricular ejection fraction, by estimation, is 20 to 25%. The left ventricle has severely decreased function. The left ventricle demonstrates global hypokinesis.   2. Right ventricular systolic function is moderately reduced. The right ventricular size is normal.   3. Prominent pectinate muscles in LA appendage . Left atrial size was mildly dilated. No left atrial/left atrial appendage thrombus was detected.   4. Mild to moderate mitral valve regurgitation.   5. The aortic valve is tricuspid. Aortic valve regurgitation is trivial.   Cardiac CT, 05/17/2022 1. There is normal pulmonary vein drainage into the left atrium with ostial measurements above. Normal variant of 3 PV on the right, two on the left.  2. There is mixing artifact on contrast imaging of the appendage, and the distal appendage is not well visualized on delayed imaging. Recommend TEE prior to ablation to exclude thrombus in distal appendage. Recommendations communicated to Dr. Lalla Brothers.  3. The esophagus runs in the left atrial midline and is in proximity to the left lower  pulmonary vein ostium.  4. No PFO/ASD.  5. Normal coronary origin. Right dominance.  6. CAC score of 1373 which is 94th percentile for age-, race-, and sex-matched controls.  R/LHC, 02/10/2022 Mild, nonobstructive coronary artery disease with 10-20% stenosis involving the proximal LAD.  No significant disease involving ramus intermedius, LCx, or  RCA. Moderately-severely elevated left heart filling pressures (PCWP 30 mmHg, LVEDP 40 mmHg).  Prominent V waves on PCWP tracing suggest moderate-severe mitral regurgitation. Severe pulmonary hypertension (mean PAP 50 mmHg, PVR 4.3 WU). Moderately elevated right heart filling pressure (mean RA 14 mmHg, RV EDP 13 mmHg). Mildly reduced Fick cardiac output/index (CO 4.7 L/min, CI 2.2 L/min/m^2).    Assessment and Plan     #) parox AFib S/p ablation 05/2022   #) Hypercoag d/t parox afib CHA2DS2-VASc Score = at least 4 [CHF History: 1, HTN History: 1, Diabetes History: 0, Stroke History: 0, Vascular Disease History: 1, Age Score: 1, Gender Score: 0].  Therefore, the patient's annual risk of stroke is 4.8 %.     {Confirm score is correct.  If not, click here to update score.  REFRESH note.  :1}   Stroke ppx - 5mg  eliquis BID, appropriately dosed No bleeding concerns   #) NICM #) HFrEF #) ICD in situ  GDMT: 10mg  jardiance, 25mg  toprol, 49-51 entresto, 25mg  spiro Diuretic: torsemide 20mg  PRN   #) ***   {Are you ordering a CV Procedure (e.g. stress test, cath, DCCV, TEE, etc)?   Press F2        :[id number]}   Current medicines are reviewed at length with the patient today.   The patient {ACTIONS; HAS/DOES NOT HAVE:19233} concerns regarding his medicines.  The following changes were made today:  {NONE DEFAULTED:18576}  Labs/ tests ordered today include: *** No orders of the defined types were placed in this encounter.    Disposition: Follow up with {EPMDS:28135} or EP APP {EPFOLLOW UP:28173}   Signed, Sherie Don, NP  05/26/23  9:19 AM  Electrophysiology [facility]

## 2023-06-03 ENCOUNTER — Telehealth: Payer: Self-pay | Admitting: Cardiology

## 2023-06-03 NOTE — Telephone Encounter (Signed)
Pt confirmed appt for 06/06/23

## 2023-06-06 ENCOUNTER — Telehealth: Payer: Self-pay

## 2023-06-06 ENCOUNTER — Other Ambulatory Visit (HOSPITAL_COMMUNITY): Payer: Self-pay

## 2023-06-06 ENCOUNTER — Ambulatory Visit: Payer: Medicare HMO | Attending: Cardiology | Admitting: Cardiology

## 2023-06-06 VITALS — BP 128/75 | HR 69 | Wt 193.0 lb

## 2023-06-06 DIAGNOSIS — I5043 Acute on chronic combined systolic (congestive) and diastolic (congestive) heart failure: Secondary | ICD-10-CM | POA: Diagnosis not present

## 2023-06-06 DIAGNOSIS — N183 Chronic kidney disease, stage 3 unspecified: Secondary | ICD-10-CM

## 2023-06-06 DIAGNOSIS — I480 Paroxysmal atrial fibrillation: Secondary | ICD-10-CM

## 2023-06-06 NOTE — Patient Instructions (Signed)
No Labs done today.   No medication changes were made. Please continue all current medications as prescribed.  Your physician recommends that you schedule a follow-up appointment in: 2 months. Please contact our office in March to schedule a April appointment.   If you have any questions or concerns before your next appointment please send [place] a message through [place] or call our office at [phone].    TO LEAVE A MESSAGE FOR THE NURSE SELECT OPTION 2, PLEASE LEAVE A MESSAGE INCLUDING: YOUR NAME DATE OF BIRTH CALL BACK NUMBER REASON FOR CALL**this is important as we prioritize the call backs  YOU WILL RECEIVE A CALL BACK THE SAME DAY AS LONG AS YOU CALL BEFORE 4:00 PM   Do the following things EVERYDAY: Weigh yourself in the morning before breakfast. Write it down and keep it in a log. Take your medicines as prescribed Eat low salt foods--Limit salt (sodium) to 2000 mg per day.  Stay as active as you can everyday Limit all fluids for the day to less than 2 liters   At the [facility] Clinic, you and your health needs are our priority. As part of our continuing mission to provide you with exceptional heart care, we have created designated Provider Care Teams. These Care Teams include your primary Cardiologist (physician) and Advanced Practice Providers (APPs- Physician Assistants and Nurse Practitioners) who all work together to provide you with the care you need, when you need it.   You may see any of the following providers on your designated Care Team at your next follow up: Dr Arvilla Meres Dr Marca Ancona Dr. Marcos Eke, NP Robbie Lis, [place] [facility] [place], [place] Brynda Peon, NP Karle Plumber, PharmD   Please be sure to bring in all your medications bottles to every appointment.    Thank you for choosing [facility]

## 2023-06-06 NOTE — Progress Notes (Signed)
ADVANCED HEART FAILURE FOLLOW UP CLINIC NOTE  Referring Physician: No ref. provider found  Primary Care: System, Provider Not In Primary Cardiologist:  HPI: Jerry Howe is a 68 y.o. male who presents for follow up of chronic HFrEF, nonischemic.      HF diagnosed in 2016, LHC at that time revealed minimal CAD.  LVEF was 25%.  He was followed by [facility] clinic and recently transferred to [facility] heart care.  He established with Dr. Mariah Milling on 12/2021 and Dr. Lalla Brothers on 01/2022.  He reported NYHA Class II symptoms and lisinopril switch to Entresto.  He remained on spironolactone and metoprolol.  He established with Dr. Lalla Brothers on 01/2022 he was stable and doing well.     He presented to the ED  10/22 after his defibrillator fired 6 times   In the ED he was noted to be in atrial fibrillation. ICD interogation showed inappropriate ICD firing for AF. Started on amio. Also found to have frequent PVCs.     Had AF ablation on 05/24/22. TEE at that time EF 20-25% Mild to moderate MR     SUBJECTIVE: Patient overall states that he is doing fairly well.  His blood pressure was fairly low initially after leaving the hospital but has improved the past few days.  He is planning to return to work tomorrow and has no active complaints.  He has not been needing the torsemide.   PMH, current medications, allergies, social history, and family history reviewed in epic.  PHYSICAL EXAM: Vitals:   06/06/23 0959  BP: 128/75  Pulse: 69  SpO2: 100%   GENERAL: Well nourished and in no apparent distress at rest.  PULM:  Normal work of breathing, clear to auscultation bilaterally. Respirations are unlabored.  CARDIAC:  JVP: Flat         Normal rate with regular rhythm. No murmurs, rubs or gallops.  Trace edema. Warm and well perfused extremities. ABDOMEN: Soft, non-tender, non-distended. NEUROLOGIC: Patient is oriented x3 with no focal or lateralizing neurologic deficits.    DATA REVIEW  CATH: Cath  02/10/22  LAD 10-20%prox. RCA and LCX ok  RA 14 RV 90/13 PA 90/30 (50) PCWP 30 with v-waves 45 Ao sat 96% PA sat 67% Fick 4.7/2.2 PVR 4.3 WU   CPX 03/22/22  Resting HR: 67 Standing HR: 67 Peak HR: 123   (80% age predicted max HR)  BP rest: 118/82 BP standing: 118/78 BP peak: 194/82  Peak VO2: 13.9 (47.9% predicted peak VO2)  VE/VCO2 slope:  36.9  OUES: 1.58  Peak RER: 0.96  VE/MVV:  43.4%  O2pulse:  72mL/beat   (69% predicted O2pulse)  Heart failure review: - Classification: Heart failure with reduced EF - Etiology: Idiopathic - NYHA Class: II - Volume status: Euvolemic - ACEi/ARB/ARNI: Currently up-titrating - Aldosterone antagonist: Maximally tolerated dose - Beta-blocker: Currently up-titrating - Digoxin: Not indicated - Hydralazine/Nitrates: Intolerant - SGLT2i: Maximally tolerated dose - GLP-1: Not a candidate - Advanced therapies: Not needed at this time - ICD: Already in place  ASSESSMENT & PLAN:  1. Chronic systolic HF - due to NICM. Unclear etiology. Has not had cMRI due to ICD. - TEE 05/24/22 EF 25-30% RV moderately decreased - Doing well NYHA II - Continue Entresto  49/51 bid - Continue Spiro 25 - Continue Toprol 50 mg daily - Continue Jardiance 10 - Taking torsemide 20mg  prn, appears euvolemic - Discussed reducing entresto to 1/2 pill in the AM for peristent low SBP, currently doing well on mid  dose - Consider CMR at next visit   2. PAF - now s/p ICD inappropriate shock x 6 in 10/23 - s/p AF ablation 05/24/22 - continue amiodarone 200mg  daily - continue Eliquis.   3. Frequent PVCs  - suppressed on amio    4. CKD 3a-3b  - baseline SCr 1.3-1.8 - Recent Scr 1.4 - Continue Jardiance  Follow up in 2 months  Clearnce Hasten, MD Advanced Heart Failure Mechanical Circulatory Support 06/06/23

## 2023-06-06 NOTE — Telephone Encounter (Signed)
Patient Advocate Encounter  Test billing for Eliquis 5 mg returns refill too soon rejection. Test billing for Eliquis 2.5 mg shows copays of $47 for 30 days, $141 for 90 days.  Burnell Blanks, CPhT Rx Patient Advocate Phone: [phone]

## 2023-06-07 ENCOUNTER — Other Ambulatory Visit: Payer: Self-pay

## 2023-06-07 ENCOUNTER — Telehealth: Payer: Self-pay | Admitting: Pharmacist

## 2023-06-07 NOTE — Telephone Encounter (Signed)
Patient called to inform that Eliquis copay is ~500 dollars at [facility]. This is due to refill too soon rejection as there is a charge from 05/26/23 that the patient never picked up. [facility] reports being unable to override. Eliquis 2.5 mg BID is $47 for a 30 day supply. Patient will come to clinic to get 2 weeks of samples, then fill when available on 06/20/23.

## 2023-06-08 ENCOUNTER — Ambulatory Visit: Payer: Medicare HMO | Admitting: Cardiology

## 2023-06-09 NOTE — Progress Notes (Unsigned)
 Electrophysiology Clinic Note    Date:  05/26/2023  Patient ID:  Jerry, Howe 12/07/1955, MRN [id number] PCP:  Enid Baas, MD  Cardiologist:  Julien Nordmann, MD HF Cardiologist: Bensimhon Electrophysiologist: Lanier Prude, MD  ***refresh  Discussed the use of AI scribe software for clinical note transcription with the patient, who gave verbal consent to proceed.   Patient Profile    Chief Complaint: ***  History of Present Illness: Jerry Howe is a 68 y.o. male with PMH notable for parox AFib, HFrEF, NICM, HTN, CKD-3; seen today for Lanier Prude, MD for routine electrophysiology followup.  He is s/p AF ablation with PVI and posterior walls on 2/5/204.  He last saw Dr. Lalla Brothers 08/2022 where he was maintaining sinus rhtyhm. Amiodarone was stopped at that time. He last saw Dr. Gala Romney 03/2023, planning for updated TTE followed by genetic eval and cMRI if EF not improved.  He was admitted to [facility] hospital 05/2023 with cough, SOB > covid +. He had paroxysms of AFib w RVR and amiodarone was restarted. He saw HF team 2/17 after hospital discharge and was doing well.   On follow-up today *** AF burden, symptoms *** palpitations *** bleeding concerns  *** fluid status, exercise tolerance  - update LFT, thyoid labs  - needs HF appt and TTE scheduled   Since last being seen in our clinic the patient reports doing ***.  he denies chest pain, palpitations, dyspnea, PND, orthopnea, nausea, vomiting, dizziness, syncope, edema, weight gain, or early satiety.     Device Information: MDT single chamber ICD, imp 07/2015  AAD History: Amiodarone, stopped 08/2022 d/t no afib. Restarted 05/2023    ROS:  Please see the history of present illness. All other systems are reviewed and otherwise negative.     Physical Exam    VS:  There were no vitals taken for this visit. BMI: There is no height or weight on file to calculate BMI.  Wt Readings from Last 3  Encounters:  06/06/23 193 lb (87.5 kg)  05/30/23 192 lb 14.4 oz (87.5 kg)  03/21/23 207 lb (93.9 kg)      GEN- The patient is well appearing, alert and oriented x 3 today.   Lungs- Clear to ausculation bilaterally, normal work of breathing.  Heart- {Blank single:19197::"Regular","Irregularly irregular"} rate and rhythm, no murmurs, rubs or gallops Extremities- {EDEMA LEVEL:28147::"No"} peripheral edema, warm, dry Skin-  *** device pocket well-healed, no tethering   Device interrogation done today and reviewed by myself:  Battery *** Lead thresholds, impedence, sensing stable *** *** episodes *** changes made today   Studies Reviewed   Previous EP, cardiology notes.    EKG is ordered. Personal review of EKG from today shows:  ***       TEE, 05/24/2022  1. Left ventricular ejection fraction, by estimation, is 20 to 25%. The left ventricle has severely decreased function. The left ventricle demonstrates global hypokinesis.   2. Right ventricular systolic function is moderately reduced. The right ventricular size is normal.   3. Prominent pectinate muscles in LA appendage . Left atrial size was mildly dilated. No left atrial/left atrial appendage thrombus was detected.   4. Mild to moderate mitral valve regurgitation.   5. The aortic valve is tricuspid. Aortic valve regurgitation is trivial.   Cardiac CT, 05/17/2022 1. There is normal pulmonary vein drainage into the left atrium with ostial measurements above. Normal variant of 3 PV on the right, two on the left.  2. There is mixing artifact on contrast imaging of the appendage, and the distal appendage is not well visualized on delayed imaging. Recommend TEE prior to ablation to exclude thrombus in distal appendage. Recommendations communicated to Dr. Lalla Brothers.  3. The esophagus runs in the left atrial midline and is in proximity to the left lower pulmonary vein ostium.  4. No PFO/ASD.  5. Normal coronary origin. Right dominance.   6. CAC score of 1373 which is 94th percentile for age-, race-, and sex-matched controls.  R/LHC, 02/10/2022 Mild, nonobstructive coronary artery disease with 10-20% stenosis involving the proximal LAD.  No significant disease involving ramus intermedius, LCx, or RCA. Moderately-severely elevated left heart filling pressures (PCWP 30 mmHg, LVEDP 40 mmHg).  Prominent V waves on PCWP tracing suggest moderate-severe mitral regurgitation. Severe pulmonary hypertension (mean PAP 50 mmHg, PVR 4.3 WU). Moderately elevated right heart filling pressure (mean RA 14 mmHg, RV EDP 13 mmHg). Mildly reduced Fick cardiac output/index (CO 4.7 L/min, CI 2.2 L/min/m^2).    Assessment and Plan     #) parox AFib #) high-risk medication use - amiodarone S/p ablation 05/2022 Recurrence of AFib iso covid + Continue 200mg  amiodarone daily Update LFTs and thyroid labs today    #) Hypercoag d/t parox afib CHA2DS2-VASc Score = at least 4 [CHF History: 1, HTN History: 1, Diabetes History: 0, Stroke History: 0, Vascular Disease History: 1, Age Score: 1, Gender Score: 0].  Therefore, the patient's annual risk of stroke is 4.8 %.     {Confirm score is correct.  If not, click here to update score.  REFRESH note.  :1}   Stroke ppx - 5mg  eliquis BID, appropriately dosed No bleeding concerns   #) NICM #) HFrEF #) ICD in situ  GDMT: 10mg  jardiance, 25mg  toprol, 49-51 entresto, 25mg  spiro Diuretic: torsemide 20mg  PRN   #) ***   {Are you ordering a CV Procedure (e.g. stress test, cath, DCCV, TEE, etc)?   Press F2        :[id number]}   Current medicines are reviewed at length with the patient today.   The patient {ACTIONS; HAS/DOES NOT HAVE:19233} concerns regarding his medicines.  The following changes were made today:  {NONE DEFAULTED:18576}  Labs/ tests ordered today include: *** No orders of the defined types were placed in this encounter.    Disposition: Follow up with {EPMDS:28135} or EP APP  {EPFOLLOW UP:28173}   Signed, Sherie Don, NP  05/26/23  9:19 AM  Electrophysiology [facility]

## 2023-06-13 ENCOUNTER — Ambulatory Visit: Payer: Medicare HMO | Attending: Cardiology | Admitting: Cardiology

## 2023-06-13 VITALS — BP 132/84 | HR 75 | Ht 69.0 in | Wt 193.8 lb

## 2023-06-13 DIAGNOSIS — Z79899 Other long term (current) drug therapy: Secondary | ICD-10-CM

## 2023-06-13 DIAGNOSIS — Z5181 Encounter for therapeutic drug level monitoring: Secondary | ICD-10-CM

## 2023-06-13 DIAGNOSIS — D6869 Other thrombophilia: Secondary | ICD-10-CM

## 2023-06-13 DIAGNOSIS — I480 Paroxysmal atrial fibrillation: Secondary | ICD-10-CM

## 2023-06-13 DIAGNOSIS — Z95810 Presence of automatic (implantable) cardiac defibrillator: Secondary | ICD-10-CM | POA: Diagnosis not present

## 2023-06-13 DIAGNOSIS — I5020 Unspecified systolic (congestive) heart failure: Secondary | ICD-10-CM

## 2023-06-13 LAB — CUP PACEART INCLINIC DEVICE CHECK
Date Time Interrogation Session: 20250224152719
Implantable Lead Connection Status: 753985
Implantable Lead Implant Date: 20170428
Implantable Lead Location: 753862
Implantable Pulse Generator Implant Date: 20170428

## 2023-06-13 NOTE — Patient Instructions (Addendum)
 Medication Instructions:  The current medical regimen is effective;  continue present plan and medications.  *If you need a refill on your cardiac medications before your next appointment, please call your pharmacy*   Lab Work: Your provider would like for you to have following labs drawn today LFT, TSH, T4.    If you have labs (blood work) drawn today and your tests are completely normal, you will receive your results only by: MyChart Message (if you have MyChart) OR A paper copy in the mail If you have any lab test that is abnormal or we need to change your treatment, we will call you to review the results.   Follow-Up: At [facility], you and your health needs are our priority.  As part of our continuing mission to provide you with exceptional heart care, we have created designated Provider Care Teams.  These Care Teams include your primary Cardiologist (physician) and Advanced Practice Providers (APPs -  Physician Assistants and Nurse Practitioners) who all work together to provide you with the care you need, when you need it.  We recommend signing up for the patient portal called "MyChart".  Sign up information is provided on this After Visit Summary.  MyChart is used to connect with patients for Virtual Visits (Telemedicine).  Patients are able to view lab/test results, encounter notes, upcoming appointments, etc.  Non-urgent messages can be sent to your provider as well.   To learn more about what you can do with MyChart, go to [email or web address].    Your next appointment:   2 month(s)  Provider:   Sherie Don, NP or Steffanie Dunn, MD    Other Instructions Please schedule ECHO and follow up with Dr.Bensimhon in 2 months.

## 2023-06-14 LAB — T4, FREE: Free T4: 1.26 ng/dL (ref 0.82–1.77)

## 2023-06-14 LAB — HEPATIC FUNCTION PANEL
ALT: 21 [IU]/L (ref 0–44)
AST: 21 [IU]/L (ref 0–40)
Albumin: 3.9 g/dL (ref 3.9–4.9)
Alkaline Phosphatase: 93 [IU]/L (ref 44–121)
Bilirubin Total: 0.5 mg/dL (ref 0.0–1.2)
Bilirubin, Direct: 0.27 mg/dL (ref 0.00–0.40)
Total Protein: 6.6 g/dL (ref 6.0–8.5)

## 2023-06-14 LAB — TSH: TSH: 0.603 u[IU]/mL (ref 0.450–4.500)

## 2023-06-24 ENCOUNTER — Telehealth (HOSPITAL_COMMUNITY): Payer: Self-pay | Admitting: Cardiology

## 2023-06-24 NOTE — Telephone Encounter (Signed)
 Patient left VM on triage line with a question regarding b/p   Returned call [facility]

## 2023-07-04 ENCOUNTER — Ambulatory Visit

## 2023-07-04 DIAGNOSIS — I480 Paroxysmal atrial fibrillation: Secondary | ICD-10-CM

## 2023-07-04 NOTE — Telephone Encounter (Signed)
 Pt called stating Dr. Elwyn Lade informed him that he could switch blood thinners to someting cheaper for his insurance. Pt wants to proceed. Please advise.

## 2023-07-06 LAB — CUP PACEART REMOTE DEVICE CHECK
Battery Remaining Longevity: 44 mo
Battery Voltage: 2.98 V
Brady Statistic RV Percent Paced: 0.04 %
Date Time Interrogation Session: 20250318155722
HighPow Impedance: 58 Ohm
Implantable Lead Connection Status: 753985
Implantable Lead Implant Date: 20170428
Implantable Lead Location: 753862
Implantable Pulse Generator Implant Date: 20170428
Lead Channel Impedance Value: 266 Ohm
Lead Channel Impedance Value: 323 Ohm
Lead Channel Pacing Threshold Amplitude: 0.875 V
Lead Channel Pacing Threshold Pulse Width: 0.4 ms
Lead Channel Sensing Intrinsic Amplitude: 9.25 mV
Lead Channel Sensing Intrinsic Amplitude: 9.25 mV
Lead Channel Setting Pacing Amplitude: 2 V
Lead Channel Setting Pacing Pulse Width: 0.4 ms
Lead Channel Setting Sensing Sensitivity: 0.3 mV
Zone Setting Status: 755011
Zone Setting Status: 755011

## 2023-07-10 ENCOUNTER — Encounter: Payer: Self-pay | Admitting: Cardiology

## 2023-08-15 NOTE — Progress Notes (Deleted)
 Electrophysiology Clinic Note    Date:  05/26/2023  Patient ID:  Jerry Howe, Jerry Howe 03/19/1956, MRN [id number] PCP:  Rex Castor, MD  Cardiologist:  Belva Boyden, MD HF Cardiologist: Bensimhon Electrophysiologist: Boyce Byes, MD    Discussed the use of AI scribe software for clinical note transcription with the patient, who gave verbal consent to proceed.   Patient Profile    Chief Complaint: AF ablation follow-up  History of Present Illness: Jerry Howe is a 68 y.o. male with PMH notable for parox AFib, HFrEF, NICM, HTN, CKD-3; seen today for Boyce Byes, MD for routine electrophysiology followup.  He is s/p AF ablation with PVI and posterior walls on 05/24/2022.  He last saw Dr. Marven Slimmer 08/2022 where he was maintaining sinus rhythm. Amiodarone  was stopped at that time. He last saw Dr. Julane Ny 03/2023, planning for updated TTE followed by genetic eval and cMRI if EF not improved.  He was admitted to [facility] hospital 05/2023 with cough, SOB > covid +. He had paroxysms of AFib w RVR and amiodarone  was restarted. He saw HF team 2/17 after hospital discharge and was doing well.   On follow-up today, he is not aware of any AF episodes since his hospitalization. He returned to work today, and was a little SOB throughout the day - thinks he might have overdone it. He has had some stomach upset with amiodarone , but is eating is improved since hospitalization. He lost about 10lbs with covid and PNA, is slowly gaining it back.  He continues to take eliquis  BID, no bleeding concerns.     Device Information: MDT single chamber ICD, imp 07/2015  AAD History: Amiodarone , stopped 08/2022 d/t no afib. Restarted 05/2023    ROS:  Please see the history of present illness. All other systems are reviewed and otherwise negative.     Physical Exam    VS:  There were no vitals taken for this visit. BMI: There is no height or weight on file to calculate BMI.  Wt Readings  from Last 3 Encounters:  06/13/23 193 lb 12.8 oz (87.9 kg)  06/06/23 193 lb (87.5 kg)  05/30/23 192 lb 14.4 oz (87.5 kg)      GEN- The patient is well appearing, alert and oriented x 3 today.   Lungs- Clear to ausculation bilaterally, normal work of breathing.  Heart- Regular rate and rhythm, no murmurs, rubs or gallops Extremities- No peripheral edema, warm, dry Skin-  device pocket well-healed, no tethering   Device interrogation done today and reviewed by myself:  Battery 3.7 years Lead threshold, impedence, sensing stable  Freq brief AFib episodes, overall burden 0.1% Brief NSVT episodes No changes made today   Studies Reviewed   Previous EP, cardiology notes.    EKG is ordered. Personal review of EKG from today shows:             TEE, 05/24/2022  1. Left ventricular ejection fraction, by estimation, is 20 to 25%. The left ventricle has severely decreased function. The left ventricle demonstrates global hypokinesis.   2. Right ventricular systolic function is moderately reduced. The right ventricular size is normal.   3. Prominent pectinate muscles in LA appendage . Left atrial size was mildly dilated. No left atrial/left atrial appendage thrombus was detected.   4. Mild to moderate mitral valve regurgitation.   5. The aortic valve is tricuspid. Aortic valve regurgitation is trivial.   Cardiac CT, 05/17/2022 1. There is normal pulmonary vein drainage  into the left atrium with ostial measurements above. Normal variant of 3 PV on the right, two on the left.  2. There is mixing artifact on contrast imaging of the appendage, and the distal appendage is not well visualized on delayed imaging. Recommend TEE prior to ablation to exclude thrombus in distal appendage. Recommendations communicated to Dr. Marven Slimmer.  3. The esophagus runs in the left atrial midline and is in proximity to the left lower pulmonary vein ostium.  4. No PFO/ASD.  5. Normal coronary origin. Right dominance.   6. CAC score of 1373 which is 94th percentile for age-, race-, and sex-matched controls.  R/LHC, 02/10/2022 Mild, nonobstructive coronary artery disease with 10-20% stenosis involving the proximal LAD.  No significant disease involving ramus intermedius, LCx, or RCA. Moderately-severely elevated left heart filling pressures (PCWP 30 mmHg, LVEDP 40 mmHg).  Prominent V waves on PCWP tracing suggest moderate-severe mitral regurgitation. Severe pulmonary hypertension (mean PAP 50 mmHg, PVR 4.3 WU). Moderately elevated right heart filling pressure (mean RA 14 mmHg, RV EDP 13 mmHg). Mildly reduced Fick cardiac output/index (CO 4.7 L/min, CI 2.2 L/min/m^2).    Assessment and Plan     #) parox AFib #) high-risk medication use - amiodarone  S/p ablation 05/2022 Recurrence of AFib iso covid + Continue 200mg  amiodarone  daily Update LFTs and thyroid  labs today Recommend he take amiodarone  at night d/t GI discomfort   #) Hypercoag d/t parox afib CHA2DS2-VASc Score = at least 4 [CHF History: 1, HTN History: 1, Diabetes History: 0, Stroke History: 0, Vascular Disease History: 1, Age Score: 1, Gender Score: 0].  Therefore, the patient's annual risk of stroke is 4.8 %.    Stroke ppx - 5mg  eliquis  BID, appropriately dosed No bleeding concerns   #) NICM #) HFrEF #) ICD in situ Warm and dry on exam GDMT: 10mg  jardiance , 25mg  toprol , 49-51 entresto , 25mg  spiro Diuretic: torsemide  20mg  PRN Due for follow-up with HF team with updated TTE, will help coordinate  #) Covid-19 #) PNA Continues to recover with improving exercise tolerance      Current medicines are reviewed at length with the patient today.   The patient does not have concerns regarding his medicines.  The following changes were made today:  none  Labs/ tests ordered today include:  No orders of the defined types were placed in this encounter.    Disposition: Follow up with Dr. Marven Slimmer or EP APP in 6  months   Signed, Elisavet Buehrer, NP  08/15/23  8:45 PM  Electrophysiology [facility]

## 2023-08-16 ENCOUNTER — Ambulatory Visit: Payer: Medicare HMO | Admitting: Cardiology

## 2023-08-16 ENCOUNTER — Ambulatory Visit: Attending: Internal Medicine

## 2023-08-16 ENCOUNTER — Ambulatory Visit: Payer: Medicare HMO

## 2023-08-16 ENCOUNTER — Telehealth: Payer: Self-pay

## 2023-08-16 NOTE — Telephone Encounter (Signed)
 Called patient, he advised that he was feeling well. No complaints- we did cancel the follow up appointment today with Suzann, NP- will follow up in 6 months.   Patient verbalized understanding.

## 2023-08-16 NOTE — Telephone Encounter (Signed)
-----   Message from Suzann Riddle sent at 08/15/2023  8:48 PM EDT ----- Regarding: appt Tuesday PM Hey - I am scheduled to see this patient Tuesday afternoon. I just saw him a couple months ago and recommended a 6mon f/up with me or CL, and sooner appt with HF. He has HF scheduled soon.  Could you call him? Not sure I need to see him if he is feeling well.   Thanks, Suzann

## 2023-08-17 NOTE — Addendum Note (Signed)
 Addended by: Edra Govern D on: 08/17/2023 11:16 AM   Modules accepted: Orders

## 2023-08-17 NOTE — Progress Notes (Signed)
 Remote ICD transmission.

## 2023-08-25 ENCOUNTER — Encounter: Payer: Self-pay | Admitting: Cardiovascular Disease

## 2023-08-25 ENCOUNTER — Encounter: Payer: Self-pay | Admitting: Internal Medicine

## 2023-08-25 DIAGNOSIS — I5020 Unspecified systolic (congestive) heart failure: Secondary | ICD-10-CM

## 2023-08-25 DIAGNOSIS — I5022 Chronic systolic (congestive) heart failure: Secondary | ICD-10-CM

## 2023-08-31 NOTE — Telephone Encounter (Signed)
 Referral Placed.   Called and spoke with pt regarding Cardiac Rehab referral. Pt aware referral has been placed.    Bensimhon, Rheta Celestine, MD to [facility]/ please refer   Me to Mardell Shade, MD  08/29/23  2:49 PM Pt requesting Cardiac Rehab. Please advise.

## 2023-09-14 ENCOUNTER — Encounter

## 2023-09-15 ENCOUNTER — Encounter: Attending: Internal Medicine

## 2023-09-15 VITALS — Ht 70.5 in | Wt 191.7 lb

## 2023-09-15 DIAGNOSIS — Z7984 Long term (current) use of oral hypoglycemic drugs: Secondary | ICD-10-CM | POA: Diagnosis not present

## 2023-09-15 DIAGNOSIS — I5020 Unspecified systolic (congestive) heart failure: Secondary | ICD-10-CM

## 2023-09-15 DIAGNOSIS — Z7901 Long term (current) use of anticoagulants: Secondary | ICD-10-CM | POA: Insufficient documentation

## 2023-09-15 DIAGNOSIS — I5022 Chronic systolic (congestive) heart failure: Secondary | ICD-10-CM | POA: Diagnosis present

## 2023-09-15 NOTE — Progress Notes (Signed)
 Cardiac Individual Treatment Plan  Patient Details  Name: Jerry Howe MRN: [id number] Date of Birth: 1956-02-05 Referring Provider:   Flowsheet Row Cardiac Rehab from 09/15/2023 in [facility] Cardiac and Pulmonary Rehab  Referring Provider Jules Oar, MD       Initial Encounter Date:  Flowsheet Row Cardiac Rehab from 09/15/2023 in [facility] Cardiac and Pulmonary Rehab  Date 09/15/23       Visit Diagnosis: Heart failure with reduced ejection fraction (HCC)  Patient's Home Medications on Admission:  Current Outpatient Medications:    albuterol  (VENTOLIN  HFA) 108 (90 Base) MCG/ACT inhaler, 2 puffs., Disp: , Rfl:    amiodarone  (PACERONE ) 200 MG tablet, Take 1 tablet (200 mg total) by mouth daily., Disp: 30 tablet, Rfl: 2   apixaban  (ELIQUIS ) 5 MG TABS tablet, Take 1 tablet (5 mg total) by mouth 2 (two) times daily., Disp: 180 tablet, Rfl: 3   Ascorbic Acid  (VITAMIN C ) 1000 MG tablet, Take 1,000 mg by mouth daily., Disp: , Rfl:    Cholecalciferol 125 MCG (5000 UT) CHEW, 1 capsule., Disp: , Rfl:    cyanocobalamin  2000 MCG tablet, Take 2,000 mcg by mouth daily., Disp: , Rfl:    empagliflozin  (JARDIANCE ) 10 MG TABS tablet, Take 1 tablet (10 mg total) by mouth daily., Disp: 30 tablet, Rfl: 2   EPINEPHrine 0.3 mg/0.3 mL IJ SOAJ injection, Inject 0.3 mg IM single dose for 90 days may repeat every ~5 to 15 minutes if needed for allergic reaction, Disp: , Rfl:    fluticasone  (FLONASE ) 50 MCG/ACT nasal spray, 1 spray in each nostril Nasally Twice a day for 30 days for allergy symptoms, Disp: , Rfl:    levocetirizine (XYZAL) 5 MG tablet, 1 tablet in the evening Orally Once a day for 100 days for allergy symptoms, Disp: , Rfl:    Magnesium  200 MG TABS, Take 200 mg by mouth daily., Disp: , Rfl:    menthol -cetylpyridinium (CEPACOL) 3 MG lozenge, Take 1 lozenge (3 mg total) by mouth as needed for sore throat., Disp: 30 tablet, Rfl: 0   metoprolol  succinate (TOPROL -XL) 25 MG 24 hr tablet, Take 25 mg by  mouth daily., Disp: , Rfl:    Multiple Vitamins-Minerals (SUPER THERA VITE M PO), Take 1 tablet by mouth daily., Disp: , Rfl:    Naphazoline-Glycerin (REDNESS RELIEF OP), Place 1 drop into both eyes 2 (two) times daily., Disp: , Rfl:    omeprazole (PRILOSEC) 20 MG capsule, Take 20 mg by mouth daily., Disp: , Rfl:    promethazine-dextromethorphan (PROMETHAZINE-DM) 6.25-15 MG/5ML syrup, 5 mL Oral every 6 hours for 6 days As needed cough, Disp: , Rfl:    pyridoxine  (B-6) 500 MG tablet, Take 500 mg by mouth daily., Disp: , Rfl:    rosuvastatin  (CRESTOR ) 10 MG tablet, 1 tablet Orally Once a day for 100 days for high cholesterol/heart disease, Disp: , Rfl:    sacubitril -valsartan  (ENTRESTO ) 49-51 MG, Take 1 tablet by mouth 2 (two) times daily., Disp: 180 tablet, Rfl: 3   spironolactone  (ALDACTONE ) 25 MG tablet, 1 tablet Orally once a day for heart/blood pressure, Disp: , Rfl:    torsemide  (DEMADEX ) 20 MG tablet, Take 1 tablet (20 mg total) by mouth daily as needed., Disp: 90 tablet, Rfl: 3   albuterol  (PROVENTIL  HFA;VENTOLIN  HFA) 108 (90 Base) MCG/ACT inhaler, Inhale 1 puff into the lungs every 6 (six) hours as needed for wheezing or shortness of breath. (Patient not taking: Reported on 09/15/2023), Disp: , Rfl:    amiodarone  (PACERONE ) 200  MG tablet, 1 tablet Orally Once a day for 30 days (Patient not taking: Reported on 09/15/2023), Disp: , Rfl:    apixaban  (ELIQUIS ) 5 MG TABS tablet, 1 tablet Oral twice a day for abnormal heart rhythm (Patient not taking: Reported on 09/15/2023), Disp: , Rfl:    ascorbic acid  (VITAMIN C ) 1000 MG tablet, 1 tablet Orally Once a day for health maintenance (Patient not taking: Reported on 09/15/2023), Disp: , Rfl:    empagliflozin  (JARDIANCE ) 10 MG TABS tablet, 1 tablet Orally Once a day for 30 days for heart (Patient not taking: Reported on 09/15/2023), Disp: , Rfl:    EPINEPHrine (ADRENALIN) 30 MG/30ML SOLN injection, as directed Injection As needed for severe allergic reaction  (Patient not taking: Reported on 09/15/2023), Disp: , Rfl:    fluticasone  (FLONASE ) 50 MCG/ACT nasal spray, Place 1 spray into both nostrils in the morning and at bedtime. (Patient not taking: Reported on 09/15/2023), Disp: , Rfl:    glucose blood test strip, Use as directed up to 3 times a day to check blood sugar for 100 days (Patient not taking: Reported on 09/15/2023), Disp: , Rfl:    metoprolol  succinate (TOPROL -XL) 25 MG 24 hr tablet, 1 tablet Orally Once a day for heart/atrial fibrillation. Hold if heart rate less than 60. (Patient not taking: Reported on 09/15/2023), Disp: , Rfl:    omeprazole (PRILOSEC) 20 MG capsule, 1 capsule. (Patient not taking: Reported on 09/15/2023), Disp: , Rfl:    promethazine-dextromethorphan (PROMETHAZINE-DM) 6.25-15 MG/5ML syrup, Take 5 mLs by mouth 4 (four) times daily as needed for cough., Disp: , Rfl:    sacubitril -valsartan  (ENTRESTO ) 24-26 MG, 1 tablet Orally Twice a day for 90 days for heart (Patient not taking: Reported on 09/15/2023), Disp: , Rfl:    spironolactone  (ALDACTONE ) 25 MG tablet, Take 1 tablet (25 mg total) by mouth daily. (Patient not taking: Reported on 09/15/2023), Disp: 90 tablet, Rfl: 3   torsemide  (DEMADEX ) 20 MG tablet, 1/2 tablet Orally Once a day for heart/excess fluid (Patient not taking: Reported on 09/15/2023), Disp: , Rfl:    Vitamin E 180 MG (400 UNIT) CAPS, 1 capsule. (Patient not taking: Reported on 09/15/2023), Disp: , Rfl:    Zinc Gluconate 100 MG TABS, 1 tablet Orally Once a day for health maintenance (Patient not taking: Reported on 09/15/2023), Disp: , Rfl:   Past Medical History: Past Medical History:  Diagnosis Date   Anxiety    Arrhythmia    CHF (congestive heart failure) (HCC)    Hypertension    Kidney stones    Shortness of breath dyspnea     Tobacco Use: Social History   Tobacco Use  Smoking Status Never  Smokeless Tobacco Never  Tobacco Comments   Never smoke 06/21/22    Labs: Review Flowsheet       Latest  Ref Rng & Units 11/19/2014 02/10/2022  Labs for ITP Cardiac and Pulmonary Rehab  Cholestrol 0 - 200 mg/dL 846  -  LDL (calc) 0 - 99 mg/dL 962  -  HDL-C >95 mg/dL 40  -  Trlycerides <284 mg/dL 132  -  PH, Arterial 4.40 - 7.45 - 7.447   PCO2 arterial 32 - 48 mmHg - 32.7   Bicarbonate 20.0 - 28.0 mmol/L - 22.6  23.6   TCO2 22 - 32 mmol/L - 24  25   Acid-base deficit 0.0 - 2.0 mmol/L - 1.0  1.0   O2 Saturation % - 96  67     Details  Multiple values from one day are sorted in reverse-chronological order          Exercise Target Goals: Exercise Program Goal: Individual exercise prescription set using results from initial 6 min walk test and THRR while considering  patient's activity barriers and safety.   Exercise Prescription Goal: Initial exercise prescription builds to 30-45 minutes a day of aerobic activity, 2-3 days per week.  Home exercise guidelines will be given to patient during program as part of exercise prescription that the participant will acknowledge.   Education: Aerobic Exercise: - Group verbal and visual presentation on the components of exercise prescription. Introduces F.I.T.T principle from ACSM for exercise prescriptions.  Reviews F.I.T.T. principles of aerobic exercise including progression. Written material given at graduation.   Education: Resistance Exercise: - Group verbal and visual presentation on the components of exercise prescription. Introduces F.I.T.T principle from ACSM for exercise prescriptions  Reviews F.I.T.T. principles of resistance exercise including progression. Written material given at graduation.    Education: Exercise & Equipment Safety: - Individual verbal instruction and demonstration of equipment use and safety with use of the equipment. Flowsheet Row Cardiac Rehab from 09/15/2023 in [facility] Cardiac and Pulmonary Rehab  Date 09/15/23  Educator MB  Instruction Review Code 1- Verbalizes Understanding       Education: Exercise  Physiology & General Exercise Guidelines: - Group verbal and written instruction with models to review the exercise physiology of the cardiovascular system and associated critical values. Provides general exercise guidelines with specific guidelines to those with heart or lung disease.    Education: Flexibility, Balance, Mind/Body Relaxation: - Group verbal and visual presentation with interactive activity on the components of exercise prescription. Introduces F.I.T.T principle from ACSM for exercise prescriptions. Reviews F.I.T.T. principles of flexibility and balance exercise training including progression. Also discusses the mind body connection.  Reviews various relaxation techniques to help reduce and manage stress (i.e. Deep breathing, progressive muscle relaxation, and visualization). Balance handout provided to take home. Written material given at graduation.   Activity Barriers & Risk Stratification:  Activity Barriers & Cardiac Risk Stratification - 09/15/23 1625       Activity Barriers & Cardiac Risk Stratification   Activity Barriers Muscular Weakness;Deconditioning    Cardiac Risk Stratification High             6 Minute Walk:  6 Minute Walk     Row Name 09/15/23 1625         6 Minute Walk   Phase Initial     Distance 1190 feet     Walk Time 6 minutes     # of Rest Breaks 0     MPH 2.25     METS 2.88     RPE 9     Perceived Dyspnea  0     VO2 Peak 10.09     Symptoms No     Resting HR 63 bpm     Resting BP 118/70     Resting Oxygen Saturation  99 %     Exercise Oxygen Saturation  during 6 min walk 96 %     Max Ex. HR 95 bpm     Max Ex. BP 128/80     2 Minute Post BP 118/72              Oxygen Initial Assessment:   Oxygen Re-Evaluation:   Oxygen Discharge (Final Oxygen Re-Evaluation):   Initial Exercise Prescription:  Initial Exercise Prescription - 09/15/23 1600  Date of Initial Exercise RX and Referring Provider   Date 09/15/23     Referring Provider Jules Oar, MD      Oxygen   Maintain Oxygen Saturation 88% or higher      Treadmill   MPH 2.2    Grade 0    Minutes 15    METs 2.68      Recumbant Bike   Level 2    RPM 50    Watts 25    Minutes 15    METs 2.88      NuStep   Level 2    SPM 80    Minutes 15    METs 2.88      REL-XR   Level 1    Speed 50    Minutes 15    METs 2.88      T5 Nustep   Level 2    SPM 80    Minutes 15    METs 2.88      Prescription Details   Frequency (times per week) 3    Duration Progress to 30 minutes of continuous aerobic without signs/symptoms of physical distress      Intensity   THRR 40-80% of Max Heartrate 99-135    Ratings of Perceived Exertion 11-13    Perceived Dyspnea 0-4      Progression   Progression Continue to progress workloads to maintain intensity without signs/symptoms of physical distress.      Resistance Training   Training Prescription Yes    Weight 5 lb    Reps 10-15             Perform Capillary Blood Glucose checks as needed.  Exercise Prescription Changes:   Exercise Prescription Changes     Row Name 09/15/23 1600             Response to Exercise   Blood Pressure (Admit) 118/70       Blood Pressure (Exercise) 128/80       Blood Pressure (Exit) 118/72       Heart Rate (Admit) 63 bpm       Heart Rate (Exercise) 95 bpm       Heart Rate (Exit) 66 bpm       Oxygen Saturation (Admit) 99 %       Oxygen Saturation (Exercise) 96 %       Oxygen Saturation (Exit) 98 %       Rating of Perceived Exertion (Exercise) 9       Perceived Dyspnea (Exercise) 0       Symptoms none       Comments results       Intensity THRR New         Progression   Average METs 2.88                Exercise Comments:   Exercise Goals and Review:   Exercise Goals     Row Name 09/15/23 1629             Exercise Goals   Increase Physical Activity Yes       Intervention Provide advice, education, support and  counseling about physical activity/exercise needs.;Develop an individualized exercise prescription for aerobic and resistive training based on initial evaluation findings, risk stratification, comorbidities and participant's personal goals.       Expected Outcomes Short Term: Attend rehab on a regular basis to increase amount of physical activity.;Long Term: Add in home exercise to make exercise part of  routine and to increase amount of physical activity.;Long Term: Exercising regularly at least 3-5 days a week.       Increase Strength and Stamina Yes       Intervention Provide advice, education, support and counseling about physical activity/exercise needs.;Develop an individualized exercise prescription for aerobic and resistive training based on initial evaluation findings, risk stratification, comorbidities and participant's personal goals.       Expected Outcomes Short Term: Increase workloads from initial exercise prescription for resistance, speed, and METs.;Short Term: Perform resistance training exercises routinely during rehab and add in resistance training at home;Long Term: Improve cardiorespiratory fitness, muscular endurance and strength as measured by increased METs and functional capacity ( )       Able to understand and use rate of perceived exertion (RPE) scale Yes       Intervention Provide education and explanation on how to use RPE scale       Expected Outcomes Short Term: Able to use RPE daily in rehab to express subjective intensity level;Long Term:  Able to use RPE to guide intensity level when exercising independently       Able to understand and use Dyspnea scale Yes       Intervention Provide education and explanation on how to use Dyspnea scale       Expected Outcomes Short Term: Able to use Dyspnea scale daily in rehab to express subjective sense of shortness of breath during exertion;Long Term: Able to use Dyspnea scale to guide intensity level when exercising independently        Knowledge and understanding of Target Heart Rate Range (THRR) Yes       Intervention Provide education and explanation of THRR including how the numbers were predicted and where they are located for reference       Expected Outcomes Short Term: Able to state/look up THRR;Short Term: Able to use daily as guideline for intensity in rehab;Long Term: Able to use THRR to govern intensity when exercising independently       Able to check pulse independently Yes       Intervention Provide education and demonstration on how to check pulse in carotid and radial arteries.;Review the importance of being able to check your own pulse for safety during independent exercise       Expected Outcomes Short Term: Able to explain why pulse checking is important during independent exercise;Long Term: Able to check pulse independently and accurately       Understanding of Exercise Prescription Yes       Intervention Provide education, explanation, and written materials on patient's individual exercise prescription       Expected Outcomes Short Term: Able to explain program exercise prescription;Long Term: Able to explain home exercise prescription to exercise independently                Exercise Goals Re-Evaluation :   Discharge Exercise Prescription (Final Exercise Prescription Changes):  Exercise Prescription Changes - 09/15/23 1600       Response to Exercise   Blood Pressure (Admit) 118/70    Blood Pressure (Exercise) 128/80    Blood Pressure (Exit) 118/72    Heart Rate (Admit) 63 bpm    Heart Rate (Exercise) 95 bpm    Heart Rate (Exit) 66 bpm    Oxygen Saturation (Admit) 99 %    Oxygen Saturation (Exercise) 96 %    Oxygen Saturation (Exit) 98 %    Rating of Perceived Exertion (Exercise) 9    Perceived Dyspnea (Exercise)  0    Symptoms none    Comments results    Intensity THRR New      Progression   Average METs 2.88             Nutrition:  Target Goals: Understanding of  nutrition guidelines, daily intake of sodium 1500mg , cholesterol 200mg , calories 30% from fat and 7% or less from saturated fats, daily to have 5 or more servings of fruits and vegetables.  Education: All About Nutrition: -Group instruction provided by verbal, written material, interactive activities, discussions, models, and posters to present general guidelines for heart healthy nutrition including fat, fiber, MyPlate, the role of sodium in heart healthy nutrition, utilization of the nutrition label, and utilization of this knowledge for meal planning. Follow up email sent as well. Written material given at graduation.   Biometrics:  Pre Biometrics - 09/15/23 1629       Pre Biometrics   Height 5' 10.5" (1.791 m)    Weight 191 lb 11.2 oz (87 kg)    Waist Circumference 38 inches    Hip Circumference 39 inches    Waist to Hip Ratio 0.97 %    BMI (Calculated) 27.11    Single Leg Stand 30 seconds              Nutrition Therapy Plan and Nutrition Goals:  Nutrition Therapy & Goals - 09/15/23 1630       Personal Nutrition Goals   Nutrition Goal Will meet with RD on 6/18      Intervention Plan   Intervention Prescribe, educate and counsel regarding individualized specific dietary modifications aiming towards targeted core components such as weight, hypertension, lipid management, diabetes, heart failure and other comorbidities.;Nutrition handout(s) given to patient.    Expected Outcomes Short Term Goal: Understand basic principles of dietary content, such as calories, fat, sodium, cholesterol and nutrients.;Short Term Goal: A plan has been developed with personal nutrition goals set during dietitian appointment.;Long Term Goal: Adherence to prescribed nutrition plan.             Nutrition Assessments:  MEDIFICTS Score Key: >=70 Need to make dietary changes  40-70 Heart Healthy Diet <= 40 Therapeutic Level Cholesterol Diet   Picture Your Plate Scores: <16 Unhealthy  dietary pattern with much room for improvement. 41-50 Dietary pattern unlikely to meet recommendations for good health and room for improvement. 51-60 More healthful dietary pattern, with some room for improvement.  >60 Healthy dietary pattern, although there may be some specific behaviors that could be improved.    Nutrition Goals Re-Evaluation:   Nutrition Goals Discharge (Final Nutrition Goals Re-Evaluation):   Psychosocial: Target Goals: Acknowledge presence or absence of significant depression and/or stress, maximize coping skills, provide positive support system. Participant is able to verbalize types and ability to use techniques and skills needed for reducing stress and depression.   Education: Stress, Anxiety, and Depression - Group verbal and visual presentation to define topics covered.  Reviews how body is impacted by stress, anxiety, and depression.  Also discusses healthy ways to reduce stress and to treat/manage anxiety and depression.  Written material given at graduation.   Education: Sleep Hygiene -Provides group verbal and written instruction about how sleep can affect your health.  Define sleep hygiene, discuss sleep cycles and impact of sleep habits. Review good sleep hygiene tips.    Initial Review & Psychosocial Screening:  Initial Psych Review & Screening - 09/15/23 1407       Initial Review   Current issues  with Current Anxiety/Panic      Family Dynamics   Good Support System? Yes    Comments Seward can look to his wife inlaws and kids for support. He states no mental instability but can get anxious at times.      Barriers   Psychosocial barriers to participate in program There are no identifiable barriers or psychosocial needs.;The patient should benefit from training in stress management and relaxation.      Screening Interventions   Interventions To provide support and resources with identified psychosocial needs;Encouraged to exercise;Provide  feedback about the scores to participant    Expected Outcomes Short Term goal: Utilizing psychosocial counselor, staff and physician to assist with identification of specific Stressors or current issues interfering with healing process. Setting desired goal for each stressor or current issue identified.;Long Term Goal: Stressors or current issues are controlled or eliminated.;Short Term goal: Identification and review with participant of any Quality of Life or Depression concerns found by scoring the questionnaire.;Long Term goal: The participant improves quality of Life and PHQ9 Scores as seen by post scores and/or verbalization of changes             Quality of Life Scores:   Scores of 19 and below usually indicate a poorer quality of life in these areas.  A difference of  2-3 points is a clinically meaningful difference.  A difference of 2-3 points in the total score of the Quality of Life Index has been associated with significant improvement in overall quality of life, self-image, physical symptoms, and general health in studies assessing change in quality of life.  PHQ-9: Review Flowsheet       09/15/2023 01/13/2017 12/09/2016  Depression screen PHQ 2/9  Decreased Interest 0 0 0  Down, Depressed, Hopeless 0 0 1  PHQ - 2 Score 0 0 1  Altered sleeping 3 - -  Tired, decreased energy 2 - -  Change in appetite 3 - -  Feeling bad or failure about yourself  0 - -  Trouble concentrating 0 - -  Moving slowly or fidgety/restless 0 - -  Suicidal thoughts 0 - -  PHQ-9 Score 8 - -  Difficult doing work/chores Somewhat difficult - -   Interpretation of Total Score  Total Score Depression Severity:  1-4 = Minimal depression, 5-9 = Mild depression, 10-14 = Moderate depression, 15-19 = Moderately severe depression, 20-27 = Severe depression   Psychosocial Evaluation and Intervention:  Psychosocial Evaluation - 09/15/23 1408       Psychosocial Evaluation & Interventions   Interventions  Relaxation education;Encouraged to exercise with the program and follow exercise prescription;Stress management education    Comments Armando can look to his wife inlaws and kids for support. He states no mental instability but can get anxious at times.    Expected Outcomes Short: Start [facility] to help with mood. Long: Maintain a healthy mental state    Continue Psychosocial Services  Follow up required by staff             Psychosocial Re-Evaluation:   Psychosocial Discharge (Final Psychosocial Re-Evaluation):   Vocational Rehabilitation: Provide vocational rehab assistance to qualifying candidates.   Vocational Rehab Evaluation & Intervention:   Education: Education Goals: Education classes will be provided on a variety of topics geared toward better understanding of heart health and risk factor modification. Participant will state understanding/return demonstration of topics presented as noted by education test scores.  Learning Barriers/Preferences:   General Cardiac Education Topics:  AED/CPR: - Group  verbal and written instruction with the use of models to demonstrate the basic use of the AED with the basic ABC's of resuscitation.   Anatomy and Cardiac Procedures: - Group verbal and visual presentation and models provide information about basic cardiac anatomy and function. Reviews the testing methods done to diagnose heart disease and the outcomes of the test results. Describes the treatment choices: Medical Management, Angioplasty, or Coronary Bypass Surgery for treating various heart conditions including Myocardial Infarction, Angina, Valve Disease, and Cardiac Arrhythmias.  Written material given at graduation.   Medication Safety: - Group verbal and visual instruction to review commonly prescribed medications for heart and lung disease. Reviews the medication, class of the drug, and side effects. Includes the steps to properly store meds and maintain the  prescription regimen.  Written material given at graduation.   Intimacy: - Group verbal instruction through game format to discuss how heart and lung disease can affect sexual intimacy. Written material given at graduation..   Know Your Numbers and Heart Failure: - Group verbal and visual instruction to discuss disease risk factors for cardiac and pulmonary disease and treatment options.  Reviews associated critical values for Overweight/Obesity, Hypertension, Cholesterol, and Diabetes.  Discusses basics of heart failure: signs/symptoms and treatments.  Introduces Heart Failure Zone chart for action plan for heart failure.  Written material given at graduation.   Infection Prevention: - Provides verbal and written material to individual with discussion of infection control including proper hand washing and proper equipment cleaning during exercise session. Flowsheet Row Cardiac Rehab from 09/15/2023 in [facility] Cardiac and Pulmonary Rehab  Date 09/15/23  Educator MB  Instruction Review Code 1- Verbalizes Understanding       Falls Prevention: - Provides verbal and written material to individual with discussion of falls prevention and safety. Flowsheet Row Cardiac Rehab from 09/15/2023 in [facility] Cardiac [facility] Pulmonary Rehab  Date 09/15/23  Educator MB  Instruction Review Code 1- Verbalizes Understanding       Other: -Provides group and verbal instruction on various topics (see comments)   Knowledge Questionnaire Score:   Core Components/Risk Factors/Patient Goals at Admission:  Personal Goals and Risk Factors at Admission - 09/15/23 1404       Core Components/Risk Factors/Patient Goals on Admission    Weight Management Yes;Weight Maintenance    Intervention Weight Management: Develop a combined nutrition and exercise program designed to reach desired caloric intake, while maintaining appropriate intake of nutrient and fiber, sodium and fats, and appropriate energy expenditure  required for the weight goal.;Weight Management: Provide education and appropriate resources to help participant work on and attain dietary goals.;Weight Management/Obesity: Establish reasonable short term and long term weight goals.    Admit Weight 191 lb 11.2 oz (87 kg)    Goal Weight: Short Term 190 lb (86.2 kg)    Goal Weight: Long Term 190 lb (86.2 kg)    Expected Outcomes Short Term: Continue to assess and modify interventions until short term weight is achieved;Long Term: Adherence to nutrition and physical activity/exercise program aimed toward attainment of established weight goal;Weight Maintenance: Understanding of the daily nutrition guidelines, which includes 25-35% calories from fat, 7% or less cal from saturated fats, less than 200mg  cholesterol, less than 1.5gm of sodium, & 5 or more servings of fruits and vegetables daily;Understanding recommendations for meals to include 15-35% energy as protein, 25-35% energy from fat, 35-60% energy from carbohydrates, less than 200mg  of dietary cholesterol, 20-35 gm of total fiber daily;Understanding of distribution of calorie intake throughout the  day with the consumption of 4-5 meals/snacks    Heart Failure Yes    Intervention Provide a combined exercise and nutrition program that is supplemented with education, support and counseling about heart failure. Directed toward relieving symptoms such as shortness of breath, decreased exercise tolerance, and extremity edema.    Expected Outcomes Improve functional capacity of life;Short term: Attendance in program 2-3 days a week with increased exercise capacity. Reported lower sodium intake. Reported increased fruit and vegetable intake. Reports medication compliance.;Short term: Daily weights obtained and reported for increase. Utilizing diuretic protocols set by physician.;Long term: Adoption of self-care skills and reduction of barriers for early signs and symptoms recognition and intervention leading to  self-care maintenance.    Hypertension Yes    Intervention Provide education on lifestyle modifcations including regular physical activity/exercise, weight management, moderate sodium restriction and increased consumption of fresh fruit, vegetables, and low fat dairy, alcohol moderation, and smoking cessation.;Monitor prescription use compliance.    Expected Outcomes Short Term: Continued assessment and intervention until BP is < 140/9mm HG in hypertensive participants. < 130/23mm HG in hypertensive participants with diabetes, heart failure or chronic kidney disease.;Long Term: Maintenance of blood pressure at goal levels.    Lipids Yes    Intervention Provide education and support for participant on nutrition & aerobic/resistive exercise along with prescribed medications to achieve LDL 70mg , HDL >40mg .    Expected Outcomes Short Term: Participant states understanding of desired cholesterol values and is compliant with medications prescribed. Participant is following exercise prescription and nutrition guidelines.;Long Term: Cholesterol controlled with medications as prescribed, with individualized exercise RX and with personalized nutrition plan. Value goals: LDL < 70mg , HDL > 40 mg.             Education:Diabetes - Individual verbal and written instruction to review signs/symptoms of diabetes, desired ranges of glucose level fasting, after meals and with exercise. Acknowledge that pre and post exercise glucose checks will be done for 3 sessions at entry of program.   Core Components/Risk Factors/Patient Goals Review:    Core Components/Risk Factors/Patient Goals at Discharge (Final Review):    ITP Comments:  ITP Comments     Row Name 09/15/23 1624           ITP Comments Completed and gym orientation for cardiac rehab. Initial ITP created and sent for review to Dr. Firman Hughes, Medical Director.                Comments: Initial ITP

## 2023-09-15 NOTE — Patient Instructions (Signed)
 Patient Instructions  Patient Details  Name: Jerry Howe MRN: [id number] Date of Birth: Nov 03, 1955 Referring Provider:  Mardell Shade, MD  Below are your personal goals for exercise, nutrition, and risk factors. Our goal is to help you stay on track towards obtaining and maintaining these goals. We will be discussing your progress on these goals with you throughout the program.  Initial Exercise Prescription:  Initial Exercise Prescription - 09/15/23 1600       Date of Initial Exercise RX and Referring Provider   Date 09/15/23    Referring Provider Jules Oar, MD      Oxygen   Maintain Oxygen Saturation 88% or higher      Treadmill   MPH 2.2    Grade 0    Minutes 15    METs 2.68      Recumbant Bike   Level 2    RPM 50    Watts 25    Minutes 15    METs 2.88      NuStep   Level 2    SPM 80    Minutes 15    METs 2.88      REL-XR   Level 1    Speed 50    Minutes 15    METs 2.88      T5 Nustep   Level 2    SPM 80    Minutes 15    METs 2.88      Prescription Details   Frequency (times per week) 3    Duration Progress to 30 minutes of continuous aerobic without signs/symptoms of physical distress      Intensity   THRR 40-80% of Max Heartrate 99-135    Ratings of Perceived Exertion 11-13    Perceived Dyspnea 0-4      Progression   Progression Continue to progress workloads to maintain intensity without signs/symptoms of physical distress.      Resistance Training   Training Prescription Yes    Weight 5 lb    Reps 10-15             Exercise Goals: Frequency: Be able to perform aerobic exercise two to three times per week in program working toward 2-5 days per week of home exercise.  Intensity: Work with a perceived exertion of 11 (fairly light) - 15 (hard) while following your exercise prescription.  We will make changes to your prescription with you as you progress through the program.   Duration: Be able to do 30 to 45 minutes of  continuous aerobic exercise in addition to a 5 minute warm-up and a 5 minute cool-down routine.   Nutrition Goals: Your personal nutrition goals will be established when you do your nutrition analysis with the dietician.  The following are general nutrition guidelines to follow: Cholesterol < 200mg /day Sodium < 1500mg /day Fiber: Men over 50 yrs - 30 grams per day  Personal Goals:  Personal Goals and Risk Factors at Admission - 09/15/23 1404       Core Components/Risk Factors/Patient Goals on Admission    Weight Management Yes;Weight Maintenance    Intervention Weight Management: Develop a combined nutrition and exercise program designed to reach desired caloric intake, while maintaining appropriate intake of nutrient and fiber, sodium and fats, and appropriate energy expenditure required for the weight goal.;Weight Management: Provide education and appropriate resources to help participant work on and attain dietary goals.;Weight Management/Obesity: Establish reasonable short term and long term weight goals.    Admit Weight 191 lb  11.2 oz (87 kg)    Goal Weight: Short Term 190 lb (86.2 kg)    Goal Weight: Long Term 190 lb (86.2 kg)    Expected Outcomes Short Term: Continue to assess and modify interventions until short term weight is achieved;Long Term: Adherence to nutrition and physical activity/exercise program aimed toward attainment of established weight goal;Weight Maintenance: Understanding of the daily nutrition guidelines, which includes 25-35% calories from fat, 7% or less cal from saturated fats, less than 200mg  cholesterol, less than 1.5gm of sodium, & 5 or more servings of fruits and vegetables daily;Understanding recommendations for meals to include 15-35% energy as protein, 25-35% energy from fat, 35-60% energy from carbohydrates, less than 200mg  of dietary cholesterol, 20-35 gm of total fiber daily;Understanding of distribution of calorie intake throughout the day with the  consumption of 4-5 meals/snacks    Heart Failure Yes    Intervention Provide a combined exercise and nutrition program that is supplemented with education, support and counseling about heart failure. Directed toward relieving symptoms such as shortness of breath, decreased exercise tolerance, and extremity edema.    Expected Outcomes Improve functional capacity of life;Short term: Attendance in program 2-3 days a week with increased exercise capacity. Reported lower sodium intake. Reported increased fruit and vegetable intake. Reports medication compliance.;Short term: Daily weights obtained and reported for increase. Utilizing diuretic protocols set by physician.;Long term: Adoption of self-care skills and reduction of barriers for early signs and symptoms recognition and intervention leading to self-care maintenance.    Hypertension Yes    Intervention Provide education on lifestyle modifcations including regular physical activity/exercise, weight management, moderate sodium restriction and increased consumption of fresh fruit, vegetables, and low fat dairy, alcohol moderation, and smoking cessation.;Monitor prescription use compliance.    Expected Outcomes Short Term: Continued assessment and intervention until BP is < 140/80mm HG in hypertensive participants. < 130/84mm HG in hypertensive participants with diabetes, heart failure or chronic kidney disease.;Long Term: Maintenance of blood pressure at goal levels.    Lipids Yes    Intervention Provide education and support for participant on nutrition & aerobic/resistive exercise along with prescribed medications to achieve LDL 70mg , HDL >40mg .    Expected Outcomes Short Term: Participant states understanding of desired cholesterol values and is compliant with medications prescribed. Participant is following exercise prescription and nutrition guidelines.;Long Term: Cholesterol controlled with medications as prescribed, with individualized exercise RX  and with personalized nutrition plan. Value goals: LDL < 70mg , HDL > 40 mg.             Tobacco Use Initial Evaluation: Social History   Tobacco Use  Smoking Status Never  Smokeless Tobacco Never  Tobacco Comments   Never smoke 06/21/22    Exercise Goals and Review:  Exercise Goals     Row Name 09/15/23 1629             Exercise Goals   Increase Physical Activity Yes       Intervention Provide advice, education, support and counseling about physical activity/exercise needs.;Develop an individualized exercise prescription for aerobic and resistive training based on initial evaluation findings, risk stratification, comorbidities and participant's personal goals.       Expected Outcomes Short Term: Attend rehab on a regular basis to increase amount of physical activity.;Long Term: Add in home exercise to make exercise part of routine and to increase amount of physical activity.;Long Term: Exercising regularly at least 3-5 days a week.       Increase Strength and Stamina Yes  Intervention Provide advice, education, support and counseling about physical activity/exercise needs.;Develop an individualized exercise prescription for aerobic and resistive training based on initial evaluation findings, risk stratification, comorbidities and participant's personal goals.       Expected Outcomes Short Term: Increase workloads from initial exercise prescription for resistance, speed, and METs.;Short Term: Perform resistance training exercises routinely during rehab and add in resistance training at home;Long Term: Improve cardiorespiratory fitness, muscular endurance and strength as measured by increased METs and functional capacity ( )       Able to understand and use rate of perceived exertion (RPE) scale Yes       Intervention Provide education and explanation on how to use RPE scale       Expected Outcomes Short Term: Able to use RPE daily in rehab to express subjective intensity  level;Long Term:  Able to use RPE to guide intensity level when exercising independently       Able to understand and use Dyspnea scale Yes       Intervention Provide education and explanation on how to use Dyspnea scale       Expected Outcomes Short Term: Able to use Dyspnea scale daily in rehab to express subjective sense of shortness of breath during exertion;Long Term: Able to use Dyspnea scale to guide intensity level when exercising independently       Knowledge and understanding of Target Heart Rate Range (THRR) Yes       Intervention Provide education and explanation of THRR including how the numbers were predicted and where they are located for reference       Expected Outcomes Short Term: Able to state/look up THRR;Short Term: Able to use daily as guideline for intensity in rehab;Long Term: Able to use THRR to govern intensity when exercising independently       Able to check pulse independently Yes       Intervention Provide education and demonstration on how to check pulse in carotid and radial arteries.;Review the importance of being able to check your own pulse for safety during independent exercise       Expected Outcomes Short Term: Able to explain why pulse checking is important during independent exercise;Long Term: Able to check pulse independently and accurately       Understanding of Exercise Prescription Yes       Intervention Provide education, explanation, and written materials on patient's individual exercise prescription       Expected Outcomes Short Term: Able to explain program exercise prescription;Long Term: Able to explain home exercise prescription to exercise independently

## 2023-09-19 ENCOUNTER — Telehealth: Payer: Self-pay | Admitting: Internal Medicine

## 2023-09-19 NOTE — Telephone Encounter (Signed)
 Called to confirm/remind patient of their appointment at the [facility] on 09/20/23.   Appointment:   [] Confirmed  [x] Left mess   [] No answer/No voice mail  [] VM Full/unable to leave message  [] Phone not in service  Patient reminded to bring all medications and/or complete list.  Confirmed patient has transportation. Gave directions, instructed to utilize valet parking.

## 2023-09-20 ENCOUNTER — Ambulatory Visit: Attending: Internal Medicine | Admitting: Internal Medicine

## 2023-09-20 VITALS — BP 110/87 | HR 77 | Wt 197.0 lb

## 2023-09-20 DIAGNOSIS — R002 Palpitations: Secondary | ICD-10-CM | POA: Diagnosis not present

## 2023-09-20 DIAGNOSIS — I2510 Atherosclerotic heart disease of native coronary artery without angina pectoris: Secondary | ICD-10-CM | POA: Insufficient documentation

## 2023-09-20 DIAGNOSIS — Z4502 Encounter for adjustment and management of automatic implantable cardiac defibrillator: Secondary | ICD-10-CM | POA: Diagnosis not present

## 2023-09-20 DIAGNOSIS — I5022 Chronic systolic (congestive) heart failure: Secondary | ICD-10-CM

## 2023-09-20 DIAGNOSIS — N1831 Chronic kidney disease, stage 3a: Secondary | ICD-10-CM | POA: Diagnosis not present

## 2023-09-20 DIAGNOSIS — Z79899 Other long term (current) drug therapy: Secondary | ICD-10-CM | POA: Diagnosis not present

## 2023-09-20 DIAGNOSIS — Z95810 Presence of automatic (implantable) cardiac defibrillator: Secondary | ICD-10-CM

## 2023-09-20 DIAGNOSIS — N183 Chronic kidney disease, stage 3 unspecified: Secondary | ICD-10-CM

## 2023-09-20 DIAGNOSIS — I428 Other cardiomyopathies: Secondary | ICD-10-CM | POA: Diagnosis not present

## 2023-09-20 DIAGNOSIS — R55 Syncope and collapse: Secondary | ICD-10-CM | POA: Diagnosis not present

## 2023-09-20 DIAGNOSIS — I480 Paroxysmal atrial fibrillation: Secondary | ICD-10-CM | POA: Diagnosis not present

## 2023-09-20 DIAGNOSIS — I493 Ventricular premature depolarization: Secondary | ICD-10-CM

## 2023-09-20 DIAGNOSIS — R0789 Other chest pain: Secondary | ICD-10-CM | POA: Diagnosis not present

## 2023-09-20 DIAGNOSIS — Z7901 Long term (current) use of anticoagulants: Secondary | ICD-10-CM | POA: Insufficient documentation

## 2023-09-20 DIAGNOSIS — I472 Ventricular tachycardia, unspecified: Secondary | ICD-10-CM | POA: Insufficient documentation

## 2023-09-20 DIAGNOSIS — I517 Cardiomegaly: Secondary | ICD-10-CM | POA: Insufficient documentation

## 2023-09-20 NOTE — Progress Notes (Signed)
 ADVANCED HEART FAILURE FOLLOW UP CLINIC NOTE  Referring Physician: No ref. provider found  Primary Care: System, Provider Not In Primary Cardiologist: Dr. Jerelene Monday HF Cardiologist: DB  HPI: Jerry Howe is a 68 y.o. male who presents for follow up of chronic HFrEF, nonischemic.      HF diagnosed in 2016, LHC at that time revealed minimal CAD.  LVEF was 25%.  He was followed by [facility] and recently transferred to [facility] heart care.  He established with Dr. Gollan on 12/2021 and Dr. Marven Slimmer on 01/2022.  He reported NYHA Class II symptoms and lisinopril  switch to Entresto .  He remained on spironolactone  and metoprolol .  He established with Dr. Marven Slimmer on 01/2022 he was stable and doing well.     He presented to the ED  10/22 after his defibrillator fired 6 times   In the ED he was noted to be in atrial fibrillation. ICD interogation showed inappropriate ICD firing for AF. Started on amio. Also found to have frequent PVCs.     Had AF ablation on 05/24/22. TEE at that time EF 20-25% Mild to moderate MR   SUBJECTIVE:  He is here for f/u with his wife. Starting CR. Says his breathing is ok. Does get a little SOB when starting activities but then gets better. But not very active. Works doing [employer] 3x/week. Compliant with meds. Says he had episode a few days ago with heart palpitations and presyncope. No ICD firing. Has not recurred.   ICD interrogation: 6 brief episodes of NSVT (2 episodes on 5/31 - just a few beats). No recent AF (burden < 0.1%). Volume ok. Activity level 3.5hr/day Personally reviewed   Current Outpatient Medications  Medication Instructions   albuterol  (PROVENTIL  HFA;VENTOLIN  HFA) 108 (90 Base) MCG/ACT inhaler 1 puff, Every 6 hours PRN   albuterol  (VENTOLIN  HFA) 108 (90 Base) MCG/ACT inhaler 2 puffs   amiodarone  (PACERONE ) 200 MG tablet 1 tablet Orally Once a day for 30 days   amiodarone  (PACERONE ) 200 mg, Oral, Daily   apixaban  (ELIQUIS ) 5 MG TABS  tablet 1 tablet Oral twice a day for abnormal heart rhythm   apixaban  (ELIQUIS ) 5 mg, Oral, 2 times daily   ascorbic acid  (VITAMIN C ) 1000 MG tablet 1 tablet Orally Once a day for health maintenance   Cholecalciferol 125 MCG (5000 UT) CHEW 1 capsule   cyanocobalamin  2,000 mcg, Daily   empagliflozin  (JARDIANCE ) 10 MG TABS tablet 1 tablet Orally Once a day for 30 days for heart   EPINEPHrine (ADRENALIN) 30 MG/30ML SOLN injection as directed Injection As needed for severe allergic reaction   EPINEPHrine 0.3 mg/0.3 mL IJ SOAJ injection Inject 0.3 mg IM single dose for 90 days may repeat every ~5 to 15 minutes if needed for allergic reaction   fluticasone  (FLONASE ) 50 MCG/ACT nasal spray 1 spray, 2 times daily   fluticasone  (FLONASE ) 50 MCG/ACT nasal spray 1 spray in each nostril Nasally Twice a day for 30 days for allergy symptoms   glucose blood test strip Use as directed up to 3 times a day to check blood sugar for 100 days   Jardiance  10 mg, Oral, Daily   levocetirizine (XYZAL) 5 MG tablet 1 tablet in the evening Orally Once a day for 100 days for allergy symptoms   Magnesium  200 mg, Daily   menthol -cetylpyridinium (CEPACOL) 3 mg, Oral, As needed   metoprolol  succinate (TOPROL -XL) 25 MG 24 hr tablet 1 tablet Orally Once a day for heart/atrial fibrillation. Hold if heart  rate less than 60.   metoprolol  succinate (TOPROL -XL) 25 mg, Daily   Multiple Vitamins-Minerals (SUPER THERA VITE M PO) 1 tablet, Daily   Naphazoline-Glycerin (REDNESS RELIEF OP) 1 drop, 2 times daily   omeprazole (PRILOSEC) 20 MG capsule 1 capsule   omeprazole (PRILOSEC) 20 mg, Daily   promethazine-dextromethorphan (PROMETHAZINE-DM) 6.25-15 MG/5ML syrup 5 mLs, 4 times daily PRN   promethazine-dextromethorphan (PROMETHAZINE-DM) 6.25-15 MG/5ML syrup 5 mL Oral every 6 hours for 6 days As needed cough   pyridoxine  (B-6) 500 mg, Daily   rosuvastatin  (CRESTOR ) 10 MG tablet 1 tablet Orally Once a day for 100 days for high  cholesterol/heart disease   sacubitril -valsartan  (ENTRESTO ) 24-26 MG 1 tablet Orally Twice a day for 90 days for heart   sacubitril -valsartan  (ENTRESTO ) 49-51 MG 1 tablet, Oral, 2 times daily   spironolactone  (ALDACTONE ) 25 MG tablet 1 tablet Orally once a day for heart/blood pressure   spironolactone  (ALDACTONE ) 25 mg, Oral, Daily   torsemide  (DEMADEX ) 20 MG tablet 1/2 tablet Orally Once a day for heart/excess fluid   torsemide  (DEMADEX ) 20 mg, Oral, Daily PRN   vitamin C  1,000 mg, Daily   Vitamin E 180 MG (400 UNIT) CAPS 1 capsule   Zinc Gluconate 100 MG TABS 1 tablet Orally Once a day for health maintenance     PHYSICAL EXAM: Vitals:   09/20/23 1549  BP: 110/87  Pulse: 77  SpO2: 99%   General:  Well appearing. No resp difficulty HEENT: normal Neck: supple. no JVD. Carotids 2+ bilat; no bruits. No lymphadenopathy or thryomegaly appreciated. Cor: PMI nondisplaced. Regular rate & rhythm. No rubs, gallops or murmurs. Lungs: clear Abdomen: soft, nontender, nondistended. No hepatosplenomegaly. No bruits or masses. Good bowel sounds. Extremities: no cyanosis, clubbing, rash, edema Neuro: alert & orientedx3, cranial nerves grossly intact. moves all 4 extremities w/o difficulty. Affect pleasant  ECG: SR 75 IVCD Frequent PVCs (5 on ECG) Personally reviewed  DATA REVIEW  CATH: Cath 02/10/22  LAD 10-20%prox. RCA and LCX ok  RA 14 RV 90/13 PA 90/30 (50) PCWP 30 with v-waves 45 Ao sat 96% PA sat 67% Fick 4.7/2.2 PVR 4.3 WU   CPX 03/22/22  Resting HR: 67 Standing HR: 67 Peak HR: 123   (80% age predicted max HR)  BP rest: 118/82 BP standing: 118/78 BP peak: 194/82  Peak VO2: 13.9 (47.9% predicted peak VO2)  VE/VCO2 slope:  36.9  OUES: 1.58  Peak RER: 0.96  VE/MVV:  43.4%  O2pulse:  71mL/beat   (69% predicted O2pulse)  Heart failure review: - Classification: Heart failure with reduced EF - Etiology: Idiopathic - NYHA Class: II - Volume status: Euvolemic -  ACEi/ARB/ARNI: Currently up-titrating - Aldosterone antagonist: Maximally tolerated dose - Beta-blocker: Currently up-titrating - Digoxin: Not indicated - Hydralazine /Nitrates: Intolerant - SGLT2i: Maximally tolerated dose - GLP-1: Not a candidate - Advanced therapies: Not needed at this time - ICD: Already in place  ASSESSMENT & PLAN:  1. Chronic systolic HF - due to NICM. Unclear etiology. Has not had cMRI due to ICD. - TEE 05/24/22 EF 25-30% RV moderately decreased - Stable NYHA II - Volume ok on exam and Optivol . Continue torsemide  20 prn - Continue Entresto   49/51 bid - Continue Spiro 25 - Continue Toprol  25 mg daily - Continue Jardiance  10 - Due for repeat echo. If EF still down consider cMRI  - Has enrolled in CR   2. PAF - now s/p ICD inappropriate shock x 6 in 10/23 - s/p AF ablation 05/24/22 -  continue amiodarone  200mg  daily - continue Eliquis . - No significant AF on ICD interrogation today   3. Frequent PVCs/NSVT - occasioanl NSVT on ICD interrogation - remains on amio per EP - still with frequent PVCs on ECG - will need sleep study   4. CKD 3a-3b  - baseline SCr 1.3-1.8 - Recent SCr 1.4 on 05/30/23  - Continue Jardiance   Jules Oar, MD  4:11 PM

## 2023-09-20 NOTE — Patient Instructions (Signed)
 Medication Changes:  No medication changes.   Testing/Procedures:  Please have your echo completed. You will check in for this at the [facility]. You have to arrive 15 MINS EARLY for preparation, otherwise you will have to reschedule.  We will call you tomorrow to get this scheduled.   Follow-Up in: 3 months with Dr. Bensimhon.  At the [facility], you and your health needs are our priority. We have a designated team specialized in the treatment of Heart Failure. This Care Team includes your primary Heart Failure Specialized Cardiologist (physician), Advanced Practice Providers (APPs- Physician Assistants and Nurse Practitioners), and Pharmacist who all work together to provide you with the care you need, when you need it.   You may see any of the following providers on your designated Care Team at your next follow up:  Dr. Jules Oar Dr. Peder Bourdon Dr. Alwin Baars Dr. Judyth Nunnery Shawnee Dellen, FNP Bevely Brush, RPH-CPP  Please be sure to bring in all your medications bottles to every appointment.   Need to Contact Us :  If you have any questions or concerns before your next appointment please send us  a message through [place] or call our office at [phone].    TO LEAVE A MESSAGE FOR THE NURSE SELECT OPTION 2, PLEASE LEAVE A MESSAGE INCLUDING: YOUR NAME DATE OF BIRTH CALL BACK NUMBER REASON FOR CALL**this is important as we prioritize the call backs  YOU WILL RECEIVE A CALL BACK THE SAME DAY AS LONG AS YOU CALL BEFORE 4:00 PM

## 2023-09-21 ENCOUNTER — Encounter: Payer: Self-pay | Admitting: *Deleted

## 2023-09-21 DIAGNOSIS — I5022 Chronic systolic (congestive) heart failure: Secondary | ICD-10-CM

## 2023-09-21 NOTE — Progress Notes (Signed)
 Cardiac Individual Treatment Plan  Patient Details  Name: Jerry Howe MRN: [id number] Date of Birth: 08/23/1955 Referring Provider:   Flowsheet Row Cardiac Rehab from 09/15/2023 in [facility] Cardiac and Pulmonary Rehab  Referring Provider Jules Oar, MD       Initial Encounter Date:  Flowsheet Row Cardiac Rehab from 09/15/2023 in [facility] Cardiac and Pulmonary Rehab  Date 09/15/23       Visit Diagnosis: Heart failure, chronic systolic (HCC)  Patient's Home Medications on Admission:  Current Outpatient Medications:    albuterol  (PROVENTIL  HFA;VENTOLIN  HFA) 108 (90 Base) MCG/ACT inhaler, Inhale 1 puff into the lungs every 6 (six) hours as needed for wheezing or shortness of breath. (Patient not taking: Reported on 09/15/2023), Disp: , Rfl:    albuterol  (VENTOLIN  HFA) 108 (90 Base) MCG/ACT inhaler, 2 puffs., Disp: , Rfl:    amiodarone  (PACERONE ) 200 MG tablet, Take 1 tablet (200 mg total) by mouth daily., Disp: 30 tablet, Rfl: 2   amiodarone  (PACERONE ) 200 MG tablet, 1 tablet Orally Once a day for 30 days (Patient not taking: Reported on 09/15/2023), Disp: , Rfl:    apixaban  (ELIQUIS ) 5 MG TABS tablet, Take 1 tablet (5 mg total) by mouth 2 (two) times daily., Disp: 180 tablet, Rfl: 3   apixaban  (ELIQUIS ) 5 MG TABS tablet, 1 tablet Oral twice a day for abnormal heart rhythm (Patient not taking: Reported on 09/15/2023), Disp: , Rfl:    Ascorbic Acid  (VITAMIN C ) 1000 MG tablet, Take 1,000 mg by mouth daily., Disp: , Rfl:    ascorbic acid  (VITAMIN C ) 1000 MG tablet, 1 tablet Orally Once a day for health maintenance (Patient not taking: Reported on 09/15/2023), Disp: , Rfl:    Cholecalciferol 125 MCG (5000 UT) CHEW, 1 capsule., Disp: , Rfl:    cyanocobalamin  2000 MCG tablet, Take 2,000 mcg by mouth daily., Disp: , Rfl:    empagliflozin  (JARDIANCE ) 10 MG TABS tablet, Take 1 tablet (10 mg total) by mouth daily., Disp: 30 tablet, Rfl: 2   empagliflozin  (JARDIANCE ) 10 MG TABS tablet, 1 tablet Orally  Once a day for 30 days for heart (Patient not taking: Reported on 09/15/2023), Disp: , Rfl:    EPINEPHrine (ADRENALIN) 30 MG/30ML SOLN injection, as directed Injection As needed for severe allergic reaction (Patient not taking: Reported on 09/15/2023), Disp: , Rfl:    EPINEPHrine 0.3 mg/0.3 mL IJ SOAJ injection, Inject 0.3 mg IM single dose for 90 days may repeat every ~5 to 15 minutes if needed for allergic reaction, Disp: , Rfl:    fluticasone  (FLONASE ) 50 MCG/ACT nasal spray, Place 1 spray into both nostrils in the morning and at bedtime. (Patient not taking: Reported on 09/15/2023), Disp: , Rfl:    fluticasone  (FLONASE ) 50 MCG/ACT nasal spray, 1 spray in each nostril Nasally Twice a day for 30 days for allergy symptoms, Disp: , Rfl:    glucose blood test strip, Use as directed up to 3 times a day to check blood sugar for 100 days (Patient not taking: Reported on 09/15/2023), Disp: , Rfl:    levocetirizine (XYZAL) 5 MG tablet, 1 tablet in the evening Orally Once a day for 100 days for allergy symptoms, Disp: , Rfl:    Magnesium  200 MG TABS, Take 200 mg by mouth daily., Disp: , Rfl:    menthol -cetylpyridinium (CEPACOL) 3 MG lozenge, Take 1 lozenge (3 mg total) by mouth as needed for sore throat., Disp: 30 tablet, Rfl: 0   metoprolol  succinate (TOPROL -XL) 25 MG 24 hr tablet,  Take 25 mg by mouth daily., Disp: , Rfl:    metoprolol  succinate (TOPROL -XL) 25 MG 24 hr tablet, 1 tablet Orally Once a day for heart/atrial fibrillation. Hold if heart rate less than 60. (Patient not taking: Reported on 09/15/2023), Disp: , Rfl:    Multiple Vitamins-Minerals (SUPER THERA VITE M PO), Take 1 tablet by mouth daily., Disp: , Rfl:    Naphazoline-Glycerin (REDNESS RELIEF OP), Place 1 drop into both eyes 2 (two) times daily., Disp: , Rfl:    omeprazole (PRILOSEC) 20 MG capsule, Take 20 mg by mouth daily., Disp: , Rfl:    omeprazole (PRILOSEC) 20 MG capsule, 1 capsule. (Patient not taking: Reported on 09/15/2023), Disp: , Rfl:     promethazine-dextromethorphan (PROMETHAZINE-DM) 6.25-15 MG/5ML syrup, Take 5 mLs by mouth 4 (four) times daily as needed for cough., Disp: , Rfl:    promethazine-dextromethorphan (PROMETHAZINE-DM) 6.25-15 MG/5ML syrup, 5 mL Oral every 6 hours for 6 days As needed cough, Disp: , Rfl:    pyridoxine  (B-6) 500 MG tablet, Take 500 mg by mouth daily., Disp: , Rfl:    rosuvastatin  (CRESTOR ) 10 MG tablet, 1 tablet Orally Once a day for 100 days for high cholesterol/heart disease, Disp: , Rfl:    sacubitril -valsartan  (ENTRESTO ) 24-26 MG, 1 tablet Orally Twice a day for 90 days for heart (Patient not taking: Reported on 09/15/2023), Disp: , Rfl:    sacubitril -valsartan  (ENTRESTO ) 49-51 MG, Take 1 tablet by mouth 2 (two) times daily., Disp: 180 tablet, Rfl: 3   spironolactone  (ALDACTONE ) 25 MG tablet, Take 1 tablet (25 mg total) by mouth daily. (Patient not taking: Reported on 09/15/2023), Disp: 90 tablet, Rfl: 3   spironolactone  (ALDACTONE ) 25 MG tablet, 1 tablet Orally once a day for heart/blood pressure, Disp: , Rfl:    torsemide  (DEMADEX ) 20 MG tablet, Take 1 tablet (20 mg total) by mouth daily as needed., Disp: 90 tablet, Rfl: 3   torsemide  (DEMADEX ) 20 MG tablet, 1/2 tablet Orally Once a day for heart/excess fluid (Patient not taking: Reported on 09/15/2023), Disp: , Rfl:    Vitamin E 180 MG (400 UNIT) CAPS, 1 capsule. (Patient not taking: Reported on 09/15/2023), Disp: , Rfl:    Zinc Gluconate 100 MG TABS, 1 tablet Orally Once a day for health maintenance (Patient not taking: Reported on 09/15/2023), Disp: , Rfl:   Past Medical History: Past Medical History:  Diagnosis Date   Anxiety    Arrhythmia    CHF (congestive heart failure) (HCC)    Hypertension    Kidney stones    Shortness of breath dyspnea     Tobacco Use: Social History   Tobacco Use  Smoking Status Never  Smokeless Tobacco Never  Tobacco Comments   Never smoke 06/21/22    Labs: Review Flowsheet       Latest Ref Rng &  Units 11/19/2014 02/10/2022  Labs for ITP Cardiac and Pulmonary Rehab  Cholestrol 0 - 200 mg/dL 098  -  LDL (calc) 0 - 99 mg/dL 119  -  HDL-C >14 mg/dL 40  -  Trlycerides <782 mg/dL 956  -  PH, Arterial 2.13 - 7.45 - 7.447   PCO2 arterial 32 - 48 mmHg - 32.7   Bicarbonate 20.0 - 28.0 mmol/L - 22.6  23.6   TCO2 22 - 32 mmol/L - 24  25   Acid-base deficit 0.0 - 2.0 mmol/L - 1.0  1.0   O2 Saturation % - 96  67     Details  Multiple values from one day are sorted in reverse-chronological order          Exercise Target Goals: Exercise Program Goal: Individual exercise prescription set using results from initial 6 min walk test and THRR while considering  patient's activity barriers and safety.   Exercise Prescription Goal: Initial exercise prescription builds to 30-45 minutes a day of aerobic activity, 2-3 days per week.  Home exercise guidelines will be given to patient during program as part of exercise prescription that the participant will acknowledge.   Education: Aerobic Exercise: - Group verbal and visual presentation on the components of exercise prescription. Introduces F.I.T.T principle from ACSM for exercise prescriptions.  Reviews F.I.T.T. principles of aerobic exercise including progression. Written material given at graduation.   Education: Resistance Exercise: - Group verbal and visual presentation on the components of exercise prescription. Introduces F.I.T.T principle from ACSM for exercise prescriptions  Reviews F.I.T.T. principles of resistance exercise including progression. Written material given at graduation.    Education: Exercise & Equipment Safety: - Individual verbal instruction and demonstration of equipment use and safety with use of the equipment. Flowsheet Row Cardiac Rehab from 09/15/2023 in [facility] Cardiac and Pulmonary Rehab  Date 09/15/23  Educator MB  Instruction Review Code 1- Verbalizes Understanding       Education: Exercise Physiology  & General Exercise Guidelines: - Group verbal and written instruction with models to review the exercise physiology of the cardiovascular system and associated critical values. Provides general exercise guidelines with specific guidelines to those with heart or lung disease.    Education: Flexibility, Balance, Mind/Body Relaxation: - Group verbal and visual presentation with interactive activity on the components of exercise prescription. Introduces F.I.T.T principle from ACSM for exercise prescriptions. Reviews F.I.T.T. principles of flexibility and balance exercise training including progression. Also discusses the mind body connection.  Reviews various relaxation techniques to help reduce and manage stress (i.e. Deep breathing, progressive muscle relaxation, and visualization). Balance handout provided to take home. Written material given at graduation.   Activity Barriers & Risk Stratification:  Activity Barriers & Cardiac Risk Stratification - 09/15/23 1625       Activity Barriers & Cardiac Risk Stratification   Activity Barriers Muscular Weakness;Deconditioning    Cardiac Risk Stratification High             6 Minute Walk:  6 Minute Walk     Row Name 09/15/23 1625         6 Minute Walk   Phase Initial     Distance 1190 feet     Walk Time 6 minutes     # of Rest Breaks 0     MPH 2.25     METS 2.88     RPE 9     Perceived Dyspnea  0     VO2 Peak 10.09     Symptoms No     Resting HR 63 bpm     Resting BP 118/70     Resting Oxygen Saturation  99 %     Exercise Oxygen Saturation  during 6 min walk 96 %     Max Ex. HR 95 bpm     Max Ex. BP 128/80     2 Minute Post BP 118/72              Oxygen Initial Assessment:   Oxygen Re-Evaluation:   Oxygen Discharge (Final Oxygen Re-Evaluation):   Initial Exercise Prescription:  Initial Exercise Prescription - 09/15/23 1600  Date of Initial Exercise RX and Referring Provider   Date 09/15/23    Referring  Provider Jules Oar, MD      Oxygen   Maintain Oxygen Saturation 88% or higher      Treadmill   MPH 2.2    Grade 0    Minutes 15    METs 2.68      Recumbant Bike   Level 2    RPM 50    Watts 25    Minutes 15    METs 2.88      NuStep   Level 2    SPM 80    Minutes 15    METs 2.88      REL-XR   Level 1    Speed 50    Minutes 15    METs 2.88      T5 Nustep   Level 2    SPM 80    Minutes 15    METs 2.88      Prescription Details   Frequency (times per week) 3    Duration Progress to 30 minutes of continuous aerobic without signs/symptoms of physical distress      Intensity   THRR 40-80% of Max Heartrate 99-135    Ratings of Perceived Exertion 11-13    Perceived Dyspnea 0-4      Progression   Progression Continue to progress workloads to maintain intensity without signs/symptoms of physical distress.      Resistance Training   Training Prescription Yes    Weight 5 lb    Reps 10-15             Perform Capillary Blood Glucose checks as needed.  Exercise Prescription Changes:   Exercise Prescription Changes     Row Name 09/15/23 1600             Response to Exercise   Blood Pressure (Admit) 118/70       Blood Pressure (Exercise) 128/80       Blood Pressure (Exit) 118/72       Heart Rate (Admit) 63 bpm       Heart Rate (Exercise) 95 bpm       Heart Rate (Exit) 66 bpm       Oxygen Saturation (Admit) 99 %       Oxygen Saturation (Exercise) 96 %       Oxygen Saturation (Exit) 98 %       Rating of Perceived Exertion (Exercise) 9       Perceived Dyspnea (Exercise) 0       Symptoms none       Comments results       Intensity THRR New         Progression   Average METs 2.88                Exercise Comments:   Exercise Goals and Review:   Exercise Goals     Row Name 09/15/23 1629             Exercise Goals   Increase Physical Activity Yes       Intervention Provide advice, education, support and counseling about  physical activity/exercise needs.;Develop an individualized exercise prescription for aerobic and resistive training based on initial evaluation findings, risk stratification, comorbidities and participant's personal goals.       Expected Outcomes Short Term: Attend rehab on a regular basis to increase amount of physical activity.;Long Term: Add in home exercise to make exercise part of  routine and to increase amount of physical activity.;Long Term: Exercising regularly at least 3-5 days a week.       Increase Strength and Stamina Yes       Intervention Provide advice, education, support and counseling about physical activity/exercise needs.;Develop an individualized exercise prescription for aerobic and resistive training based on initial evaluation findings, risk stratification, comorbidities and participant's personal goals.       Expected Outcomes Short Term: Increase workloads from initial exercise prescription for resistance, speed, and METs.;Short Term: Perform resistance training exercises routinely during rehab and add in resistance training at home;Long Term: Improve cardiorespiratory fitness, muscular endurance and strength as measured by increased METs and functional capacity ( )       Able to understand and use rate of perceived exertion (RPE) scale Yes       Intervention Provide education and explanation on how to use RPE scale       Expected Outcomes Short Term: Able to use RPE daily in rehab to express subjective intensity level;Long Term:  Able to use RPE to guide intensity level when exercising independently       Able to understand and use Dyspnea scale Yes       Intervention Provide education and explanation on how to use Dyspnea scale       Expected Outcomes Short Term: Able to use Dyspnea scale daily in rehab to express subjective sense of shortness of breath during exertion;Long Term: Able to use Dyspnea scale to guide intensity level when exercising independently       Knowledge  and understanding of Target Heart Rate Range (THRR) Yes       Intervention Provide education and explanation of THRR including how the numbers were predicted and where they are located for reference       Expected Outcomes Short Term: Able to state/look up THRR;Short Term: Able to use daily as guideline for intensity in rehab;Long Term: Able to use THRR to govern intensity when exercising independently       Able to check pulse independently Yes       Intervention Provide education and demonstration on how to check pulse in carotid and radial arteries.;Review the importance of being able to check your own pulse for safety during independent exercise       Expected Outcomes Short Term: Able to explain why pulse checking is important during independent exercise;Long Term: Able to check pulse independently and accurately       Understanding of Exercise Prescription Yes       Intervention Provide education, explanation, and written materials on patient's individual exercise prescription       Expected Outcomes Short Term: Able to explain program exercise prescription;Long Term: Able to explain home exercise prescription to exercise independently                Exercise Goals Re-Evaluation :   Discharge Exercise Prescription (Final Exercise Prescription Changes):  Exercise Prescription Changes - 09/15/23 1600       Response to Exercise   Blood Pressure (Admit) 118/70    Blood Pressure (Exercise) 128/80    Blood Pressure (Exit) 118/72    Heart Rate (Admit) 63 bpm    Heart Rate (Exercise) 95 bpm    Heart Rate (Exit) 66 bpm    Oxygen Saturation (Admit) 99 %    Oxygen Saturation (Exercise) 96 %    Oxygen Saturation (Exit) 98 %    Rating of Perceived Exertion (Exercise) 9    Perceived Dyspnea (Exercise)  0    Symptoms none    Comments results    Intensity THRR New      Progression   Average METs 2.88             Nutrition:  Target Goals: Understanding of nutrition  guidelines, daily intake of sodium 1500mg , cholesterol 200mg , calories 30% from fat and 7% or less from saturated fats, daily to have 5 or more servings of fruits and vegetables.  Education: All About Nutrition: -Group instruction provided by verbal, written material, interactive activities, discussions, models, and posters to present general guidelines for heart healthy nutrition including fat, fiber, MyPlate, the role of sodium in heart healthy nutrition, utilization of the nutrition label, and utilization of this knowledge for meal planning. Follow up email sent as well. Written material given at graduation.   Biometrics:  Pre Biometrics - 09/15/23 1629       Pre Biometrics   Height 5' 10.5" (1.791 m)    Weight 191 lb 11.2 oz (87 kg)    Waist Circumference 38 inches    Hip Circumference 39 inches    Waist to Hip Ratio 0.97 %    BMI (Calculated) 27.11    Single Leg Stand 30 seconds              Nutrition Therapy Plan and Nutrition Goals:  Nutrition Therapy & Goals - 09/15/23 1630       Personal Nutrition Goals   Nutrition Goal Will meet with RD on 6/18      Intervention Plan   Intervention Prescribe, educate and counsel regarding individualized specific dietary modifications aiming towards targeted core components such as weight, hypertension, lipid management, diabetes, heart failure and other comorbidities.;Nutrition handout(s) given to patient.    Expected Outcomes Short Term Goal: Understand basic principles of dietary content, such as calories, fat, sodium, cholesterol and nutrients.;Short Term Goal: A plan has been developed with personal nutrition goals set during dietitian appointment.;Long Term Goal: Adherence to prescribed nutrition plan.             Nutrition Assessments:  MEDIFICTS Score Key: >=70 Need to make dietary changes  40-70 Heart Healthy Diet <= 40 Therapeutic Level Cholesterol Diet   Picture Your Plate Scores: <16 Unhealthy dietary pattern  with much room for improvement. 41-50 Dietary pattern unlikely to meet recommendations for good health and room for improvement. 51-60 More healthful dietary pattern, with some room for improvement.  >60 Healthy dietary pattern, although there may be some specific behaviors that could be improved.    Nutrition Goals Re-Evaluation:   Nutrition Goals Discharge (Final Nutrition Goals Re-Evaluation):   Psychosocial: Target Goals: Acknowledge presence or absence of significant depression and/or stress, maximize coping skills, provide positive support system. Participant is able to verbalize types and ability to use techniques and skills needed for reducing stress and depression.   Education: Stress, Anxiety, and Depression - Group verbal and visual presentation to define topics covered.  Reviews how body is impacted by stress, anxiety, and depression.  Also discusses healthy ways to reduce stress and to treat/manage anxiety and depression.  Written material given at graduation.   Education: Sleep Hygiene -Provides group verbal and written instruction about how sleep can affect your health.  Define sleep hygiene, discuss sleep cycles and impact of sleep habits. Review good sleep hygiene tips.    Initial Review & Psychosocial Screening:  Initial Psych Review & Screening - 09/15/23 1407       Initial Review   Current issues  with Current Anxiety/Panic      Family Dynamics   Good Support System? Yes    Comments Shemar can look to his wife inlaws and kids for support. He states no mental instability but can get anxious at times.      Barriers   Psychosocial barriers to participate in program There are no identifiable barriers or psychosocial needs.;The patient should benefit from training in stress management and relaxation.      Screening Interventions   Interventions To provide support and resources with identified psychosocial needs;Encouraged to exercise;Provide feedback about the  scores to participant    Expected Outcomes Short Term goal: Utilizing psychosocial counselor, staff and physician to assist with identification of specific Stressors or current issues interfering with healing process. Setting desired goal for each stressor or current issue identified.;Long Term Goal: Stressors or current issues are controlled or eliminated.;Short Term goal: Identification and review with participant of any Quality of Life or Depression concerns found by scoring the questionnaire.;Long Term goal: The participant improves quality of Life and PHQ9 Scores as seen by post scores and/or verbalization of changes             Quality of Life Scores:   Scores of 19 and below usually indicate a poorer quality of life in these areas.  A difference of  2-3 points is a clinically meaningful difference.  A difference of 2-3 points in the total score of the Quality of Life Index has been associated with significant improvement in overall quality of life, self-image, physical symptoms, and general health in studies assessing change in quality of life.  PHQ-9: Review Flowsheet       09/15/2023 01/13/2017 12/09/2016  Depression screen PHQ 2/9  Decreased Interest 0 0 0  Down, Depressed, Hopeless 0 0 1  PHQ - 2 Score 0 0 1  Altered sleeping 3 - -  Tired, decreased energy 2 - -  Change in appetite 3 - -  Feeling bad or failure about yourself  0 - -  Trouble concentrating 0 - -  Moving slowly or fidgety/restless 0 - -  Suicidal thoughts 0 - -  PHQ-9 Score 8 - -  Difficult doing work/chores Somewhat difficult - -   Interpretation of Total Score  Total Score Depression Severity:  1-4 = Minimal depression, 5-9 = Mild depression, 10-14 = Moderate depression, 15-19 = Moderately severe depression, 20-27 = Severe depression   Psychosocial Evaluation and Intervention:  Psychosocial Evaluation - 09/15/23 1408       Psychosocial Evaluation & Interventions   Interventions Relaxation  education;Encouraged to exercise with the program and follow exercise prescription;Stress management education    Comments Yonas can look to his wife inlaws and kids for support. He states no mental instability but can get anxious at times.    Expected Outcomes Short: Start [facility] to help with mood. Long: Maintain a healthy mental state    Continue Psychosocial Services  Follow up required by staff             Psychosocial Re-Evaluation:   Psychosocial Discharge (Final Psychosocial Re-Evaluation):   Vocational Rehabilitation: Provide vocational rehab assistance to qualifying candidates.   Vocational Rehab Evaluation & Intervention:   Education: Education Goals: Education classes will be provided on a variety of topics geared toward better understanding of heart health and risk factor modification. Participant will state understanding/return demonstration of topics presented as noted by education test scores.  Learning Barriers/Preferences:   General Cardiac Education Topics:  AED/CPR: - Group  verbal and written instruction with the use of models to demonstrate the basic use of the AED with the basic ABC's of resuscitation.   Anatomy and Cardiac Procedures: - Group verbal and visual presentation and models provide information about basic cardiac anatomy and function. Reviews the testing methods done to diagnose heart disease and the outcomes of the test results. Describes the treatment choices: Medical Management, Angioplasty, or Coronary Bypass Surgery for treating various heart conditions including Myocardial Infarction, Angina, Valve Disease, and Cardiac Arrhythmias.  Written material given at graduation.   Medication Safety: - Group verbal and visual instruction to review commonly prescribed medications for heart and lung disease. Reviews the medication, class of the drug, and side effects. Includes the steps to properly store meds and maintain the prescription  regimen.  Written material given at graduation.   Intimacy: - Group verbal instruction through game format to discuss how heart and lung disease can affect sexual intimacy. Written material given at graduation..   Know Your Numbers and Heart Failure: - Group verbal and visual instruction to discuss disease risk factors for cardiac and pulmonary disease and treatment options.  Reviews associated critical values for Overweight/Obesity, Hypertension, Cholesterol, and Diabetes.  Discusses basics of heart failure: signs/symptoms and treatments.  Introduces Heart Failure Zone chart for action plan for heart failure.  Written material given at graduation.   Infection Prevention: - Provides verbal and written material to individual with discussion of infection control including proper hand washing and proper equipment cleaning during exercise session. Flowsheet Row Cardiac Rehab from 09/15/2023 in [facility] Cardiac and Pulmonary Rehab  Date 09/15/23  Educator MB  Instruction Review Code 1- Verbalizes Understanding       Falls Prevention: - Provides verbal and written material to individual with discussion of falls prevention and safety. Flowsheet Row Cardiac Rehab from 09/15/2023 in [facility] Cardiac [facility] Pulmonary Rehab  Date 09/15/23  Educator MB  Instruction Review Code 1- Verbalizes Understanding       Other: -Provides group and verbal instruction on various topics (see comments)   Knowledge Questionnaire Score:   Core Components/Risk Factors/Patient Goals at Admission:  Personal Goals and Risk Factors at Admission - 09/15/23 1404       Core Components/Risk Factors/Patient Goals on Admission    Weight Management Yes;Weight Maintenance    Intervention Weight Management: Develop a combined nutrition and exercise program designed to reach desired caloric intake, while maintaining appropriate intake of nutrient and fiber, sodium and fats, and appropriate energy expenditure required for the  weight goal.;Weight Management: Provide education and appropriate resources to help participant work on and attain dietary goals.;Weight Management/Obesity: Establish reasonable short term and long term weight goals.    Admit Weight 191 lb 11.2 oz (87 kg)    Goal Weight: Short Term 190 lb (86.2 kg)    Goal Weight: Long Term 190 lb (86.2 kg)    Expected Outcomes Short Term: Continue to assess and modify interventions until short term weight is achieved;Long Term: Adherence to nutrition and physical activity/exercise program aimed toward attainment of established weight goal;Weight Maintenance: Understanding of the daily nutrition guidelines, which includes 25-35% calories from fat, 7% or less cal from saturated fats, less than 200mg  cholesterol, less than 1.5gm of sodium, & 5 or more servings of fruits and vegetables daily;Understanding recommendations for meals to include 15-35% energy as protein, 25-35% energy from fat, 35-60% energy from carbohydrates, less than 200mg  of dietary cholesterol, 20-35 gm of total fiber daily;Understanding of distribution of calorie intake throughout the  day with the consumption of 4-5 meals/snacks    Heart Failure Yes    Intervention Provide a combined exercise and nutrition program that is supplemented with education, support and counseling about heart failure. Directed toward relieving symptoms such as shortness of breath, decreased exercise tolerance, and extremity edema.    Expected Outcomes Improve functional capacity of life;Short term: Attendance in program 2-3 days a week with increased exercise capacity. Reported lower sodium intake. Reported increased fruit and vegetable intake. Reports medication compliance.;Short term: Daily weights obtained and reported for increase. Utilizing diuretic protocols set by physician.;Long term: Adoption of self-care skills and reduction of barriers for early signs and symptoms recognition and intervention leading to self-care  maintenance.    Hypertension Yes    Intervention Provide education on lifestyle modifcations including regular physical activity/exercise, weight management, moderate sodium restriction and increased consumption of fresh fruit, vegetables, and low fat dairy, alcohol moderation, and smoking cessation.;Monitor prescription use compliance.    Expected Outcomes Short Term: Continued assessment and intervention until BP is < 140/5mm HG in hypertensive participants. < 130/91mm HG in hypertensive participants with diabetes, heart failure or chronic kidney disease.;Long Term: Maintenance of blood pressure at goal levels.    Lipids Yes    Intervention Provide education and support for participant on nutrition & aerobic/resistive exercise along with prescribed medications to achieve LDL 70mg , HDL >40mg .    Expected Outcomes Short Term: Participant states understanding of desired cholesterol values and is compliant with medications prescribed. Participant is following exercise prescription and nutrition guidelines.;Long Term: Cholesterol controlled with medications as prescribed, with individualized exercise RX and with personalized nutrition plan. Value goals: LDL < 70mg , HDL > 40 mg.             Education:Diabetes - Individual verbal and written instruction to review signs/symptoms of diabetes, desired ranges of glucose level fasting, after meals and with exercise. Acknowledge that pre and post exercise glucose checks will be done for 3 sessions at entry of program.   Core Components/Risk Factors/Patient Goals Review:    Core Components/Risk Factors/Patient Goals at Discharge (Final Review):    ITP Comments:  ITP Comments     Row Name 09/15/23 1624 09/21/23 0930         ITP Comments Completed and gym orientation for cardiac rehab. Initial ITP created and sent for review to Dr. Firman Hughes, Medical Director. 30 Day review completed. Medical Director ITP review done, changes made as  directed, and signed approval by Medical Director.    new to program               Comments:

## 2023-09-22 ENCOUNTER — Encounter

## 2023-09-26 ENCOUNTER — Encounter: Attending: Internal Medicine

## 2023-09-26 DIAGNOSIS — I5022 Chronic systolic (congestive) heart failure: Secondary | ICD-10-CM | POA: Insufficient documentation

## 2023-09-26 DIAGNOSIS — I5020 Unspecified systolic (congestive) heart failure: Secondary | ICD-10-CM | POA: Diagnosis present

## 2023-09-26 NOTE — Addendum Note (Signed)
 Addended by: Cheree Fowles A on: 09/26/2023 12:16 PM   Modules accepted: Orders

## 2023-09-26 NOTE — Progress Notes (Signed)
 Daily Session Note  Patient Details  Name: Jerry Howe MRN: [id number] Date of Birth: 1955/12/21 Referring Provider:   Flowsheet Row Cardiac Rehab from 09/15/2023 in [facility] Cardiac and Pulmonary Rehab  Referring Provider Jules Oar, MD       Encounter Date: 09/26/2023  Check In:  Session Check In - 09/26/23 1351       Check-In   Supervising physician immediately available to respond to emergencies See telemetry face sheet for immediately available ER MD    Location [facility]-Cardiac & Pulmonary Rehab    Staff Present Sherle Dire, BS, Exercise Physiologist;Margaret Best, MS, Exercise Physiologist;Denise Rhew PhD, RN,CNS,CEN;Obadiah Dennard Barb Levers [facility]    Virtual Visit No    Medication changes reported     No    Fall or balance concerns reported    No    Warm-up and Cool-down Performed on first and last piece of equipment    Resistance Training Performed Yes    VAD Patient? No    PAD/SET Patient? No      Pain Assessment   Currently in Pain? No/denies                Social History   Tobacco Use  Smoking Status Never  Smokeless Tobacco Never  Tobacco Comments   Never smoke 06/21/22    Goals Met:  Independence with exercise equipment Exercise tolerated well No report of concerns or symptoms today Strength training completed today  Goals Unmet:  Not Applicable  Comments: First full day of exercise!  Patient was oriented to gym and equipment including functions, settings, policies, and procedures.  Patient's individual exercise prescription and treatment plan were reviewed.  All starting workloads were established based on the results of the 6 minute walk test done at initial orientation visit.  The plan for exercise progression was also introduced and progression will be customized based on patient's performance and goals.    Dr. Firman Hughes is Medical Director for [facility] Cardiac Rehabilitation.  Dr. Fuad Aleskerov is Medical Director for [facility]  Pulmonary Rehabilitation.

## 2023-09-28 ENCOUNTER — Encounter

## 2023-09-29 ENCOUNTER — Encounter

## 2023-10-03 ENCOUNTER — Encounter: Admitting: *Deleted

## 2023-10-03 ENCOUNTER — Ambulatory Visit: Payer: Medicare HMO

## 2023-10-03 ENCOUNTER — Encounter

## 2023-10-03 DIAGNOSIS — I5022 Chronic systolic (congestive) heart failure: Secondary | ICD-10-CM | POA: Diagnosis not present

## 2023-10-03 DIAGNOSIS — I5020 Unspecified systolic (congestive) heart failure: Secondary | ICD-10-CM

## 2023-10-03 DIAGNOSIS — I480 Paroxysmal atrial fibrillation: Secondary | ICD-10-CM

## 2023-10-03 LAB — CUP PACEART REMOTE DEVICE CHECK
Battery Remaining Longevity: 42 mo
Battery Voltage: 2.97 V
Brady Statistic RV Percent Paced: 0.33 %
Date Time Interrogation Session: 20250616001705
HighPow Impedance: 54 Ohm
Implantable Lead Connection Status: 753985
Implantable Lead Implant Date: 20170428
Implantable Lead Location: 753862
Implantable Pulse Generator Implant Date: 20170428
Lead Channel Impedance Value: 266 Ohm
Lead Channel Impedance Value: 323 Ohm
Lead Channel Pacing Threshold Amplitude: 0.875 V
Lead Channel Pacing Threshold Pulse Width: 0.4 ms
Lead Channel Sensing Intrinsic Amplitude: 9 mV
Lead Channel Sensing Intrinsic Amplitude: 9 mV
Lead Channel Setting Pacing Amplitude: 2 V
Lead Channel Setting Pacing Pulse Width: 0.4 ms
Lead Channel Setting Sensing Sensitivity: 0.3 mV
Zone Setting Status: 755011
Zone Setting Status: 755011

## 2023-10-03 NOTE — Progress Notes (Signed)
 Daily Session Note  Patient Details  Name: Jerry Howe MRN: [id number] Date of Birth: [birth year] Referring Provider:   Flowsheet Row Cardiac Rehab from 09/15/2023 in [facility] Cardiac and Pulmonary Rehab  Referring Provider Jules Oar, MD    Encounter Date: 10/03/2023  Check In:  Session Check In - 10/03/23 1538       Check-In   Supervising physician immediately available to respond to emergencies See telemetry face sheet for immediately available ER MD    Location [facility]-Cardiac & Pulmonary Rehab    Staff Present Sue Em RN,BSN;Susanne Bice, RN, BSN, CCRP;Margaret Best, MS, Exercise Physiologist;Maxon Conetta BS, Exercise Physiologist    Virtual Visit No    Medication changes reported     No    Fall or balance concerns reported    No    Warm-up and Cool-down Performed on first and last piece of equipment    Resistance Training Performed Yes    VAD Patient? No    PAD/SET Patient? No      Pain Assessment   Currently in Pain? No/denies             Social History   Tobacco Use  Smoking Status Never  Smokeless Tobacco Never  Tobacco Comments   Never smoke 06/21/22    Goals Met:  Independence with exercise equipment Exercise tolerated well No report of concerns or symptoms today Strength training completed today  Goals Unmet:  Not Applicable  Comments: Pt able to follow exercise prescription today without complaint.  Will continue to monitor for progression.    Dr. Firman Hughes is Medical Director for [facility] Cardiac Rehabilitation.  Dr. Fuad Aleskerov is Medical Director for [facility] Pulmonary Rehabilitation.

## 2023-10-04 ENCOUNTER — Ambulatory Visit: Payer: Self-pay | Admitting: Cardiology

## 2023-10-05 ENCOUNTER — Encounter

## 2023-10-05 ENCOUNTER — Ambulatory Visit

## 2023-10-06 ENCOUNTER — Encounter

## 2023-10-06 ENCOUNTER — Encounter: Payer: Self-pay | Admitting: *Deleted

## 2023-10-06 DIAGNOSIS — I5022 Chronic systolic (congestive) heart failure: Secondary | ICD-10-CM

## 2023-10-06 NOTE — Progress Notes (Signed)
 Cardiac Individual Treatment Plan  Patient Details  Name: Jerry Howe MRN: [id number] Date of Birth: 1955/12/11 Referring Provider:   Flowsheet Row Cardiac Rehab from 09/15/2023 in [facility] Cardiac and Pulmonary Rehab  Referring Provider Jules Oar, MD    Initial Encounter Date:  Flowsheet Row Cardiac Rehab from 09/15/2023 in [facility] Cardiac and Pulmonary Rehab  Date 09/15/23    Visit Diagnosis: Heart failure, chronic systolic (HCC)  Patient's Home Medications on Admission:  Current Outpatient Medications:    albuterol  (PROVENTIL  HFA;VENTOLIN  HFA) 108 (90 Base) MCG/ACT inhaler, Inhale 1 puff into the lungs every 6 (six) hours as needed for wheezing or shortness of breath. (Patient not taking: Reported on 09/15/2023), Disp: , Rfl:    albuterol  (VENTOLIN  HFA) 108 (90 Base) MCG/ACT inhaler, 2 puffs., Disp: , Rfl:    amiodarone  (PACERONE ) 200 MG tablet, Take 1 tablet (200 mg total) by mouth daily., Disp: 30 tablet, Rfl: 2   amiodarone  (PACERONE ) 200 MG tablet, 1 tablet Orally Once a day for 30 days (Patient not taking: Reported on 09/15/2023), Disp: , Rfl:    apixaban  (ELIQUIS ) 5 MG TABS tablet, Take 1 tablet (5 mg total) by mouth 2 (two) times daily., Disp: 180 tablet, Rfl: 3   apixaban  (ELIQUIS ) 5 MG TABS tablet, 1 tablet Oral twice a day for abnormal heart rhythm (Patient not taking: Reported on 09/15/2023), Disp: , Rfl:    Ascorbic Acid  (VITAMIN C ) 1000 MG tablet, Take 1,000 mg by mouth daily., Disp: , Rfl:    ascorbic acid  (VITAMIN C ) 1000 MG tablet, 1 tablet Orally Once a day for health maintenance (Patient not taking: Reported on 09/15/2023), Disp: , Rfl:    Cholecalciferol 125 MCG (5000 UT) CHEW, 1 capsule., Disp: , Rfl:    cyanocobalamin  2000 MCG tablet, Take 2,000 mcg by mouth daily., Disp: , Rfl:    empagliflozin  (JARDIANCE ) 10 MG TABS tablet, Take 1 tablet (10 mg total) by mouth daily., Disp: 30 tablet, Rfl: 2   empagliflozin  (JARDIANCE ) 10 MG TABS tablet, 1 tablet Orally Once a  day for 30 days for heart (Patient not taking: Reported on 09/15/2023), Disp: , Rfl:    EPINEPHrine (ADRENALIN) 30 MG/30ML SOLN injection, as directed Injection As needed for severe allergic reaction (Patient not taking: Reported on 09/15/2023), Disp: , Rfl:    EPINEPHrine 0.3 mg/0.3 mL IJ SOAJ injection, Inject 0.3 mg IM single dose for 90 days may repeat every ~5 to 15 minutes if needed for allergic reaction, Disp: , Rfl:    fluticasone  (FLONASE ) 50 MCG/ACT nasal spray, Place 1 spray into both nostrils in the morning and at bedtime. (Patient not taking: Reported on 09/15/2023), Disp: , Rfl:    fluticasone  (FLONASE ) 50 MCG/ACT nasal spray, 1 spray in each nostril Nasally Twice a day for 30 days for allergy symptoms, Disp: , Rfl:    glucose blood test strip, Use as directed up to 3 times a day to check blood sugar for 100 days (Patient not taking: Reported on 09/15/2023), Disp: , Rfl:    levocetirizine (XYZAL) 5 MG tablet, 1 tablet in the evening Orally Once a day for 100 days for allergy symptoms, Disp: , Rfl:    Magnesium  200 MG TABS, Take 200 mg by mouth daily., Disp: , Rfl:    menthol -cetylpyridinium (CEPACOL) 3 MG lozenge, Take 1 lozenge (3 mg total) by mouth as needed for sore throat., Disp: 30 tablet, Rfl: 0   metoprolol  succinate (TOPROL -XL) 25 MG 24 hr tablet, Take 25 mg by mouth daily.,  Disp: , Rfl:    metoprolol  succinate (TOPROL -XL) 25 MG 24 hr tablet, 1 tablet Orally Once a day for heart/atrial fibrillation. Hold if heart rate less than 60. (Patient not taking: Reported on 09/15/2023), Disp: , Rfl:    Multiple Vitamins-Minerals (SUPER THERA VITE M PO), Take 1 tablet by mouth daily., Disp: , Rfl:    Naphazoline-Glycerin (REDNESS RELIEF OP), Place 1 drop into both eyes 2 (two) times daily., Disp: , Rfl:    omeprazole (PRILOSEC) 20 MG capsule, Take 20 mg by mouth daily., Disp: , Rfl:    omeprazole (PRILOSEC) 20 MG capsule, 1 capsule. (Patient not taking: Reported on 09/15/2023), Disp: , Rfl:     promethazine-dextromethorphan (PROMETHAZINE-DM) 6.25-15 MG/5ML syrup, Take 5 mLs by mouth 4 (four) times daily as needed for cough., Disp: , Rfl:    promethazine-dextromethorphan (PROMETHAZINE-DM) 6.25-15 MG/5ML syrup, 5 mL Oral every 6 hours for 6 days As needed cough, Disp: , Rfl:    pyridoxine  (B-6) 500 MG tablet, Take 500 mg by mouth daily., Disp: , Rfl:    rosuvastatin  (CRESTOR ) 10 MG tablet, 1 tablet Orally Once a day for 100 days for high cholesterol/heart disease, Disp: , Rfl:    sacubitril -valsartan  (ENTRESTO ) 24-26 MG, 1 tablet Orally Twice a day for 90 days for heart (Patient not taking: Reported on 09/15/2023), Disp: , Rfl:    sacubitril -valsartan  (ENTRESTO ) 49-51 MG, Take 1 tablet by mouth 2 (two) times daily., Disp: 180 tablet, Rfl: 3   spironolactone  (ALDACTONE ) 25 MG tablet, Take 1 tablet (25 mg total) by mouth daily. (Patient not taking: Reported on 09/15/2023), Disp: 90 tablet, Rfl: 3   spironolactone  (ALDACTONE ) 25 MG tablet, 1 tablet Orally once a day for heart/blood pressure, Disp: , Rfl:    torsemide  (DEMADEX ) 20 MG tablet, Take 1 tablet (20 mg total) by mouth daily as needed., Disp: 90 tablet, Rfl: 3   torsemide  (DEMADEX ) 20 MG tablet, 1/2 tablet Orally Once a day for heart/excess fluid (Patient not taking: Reported on 09/15/2023), Disp: , Rfl:    Vitamin E 180 MG (400 UNIT) CAPS, 1 capsule. (Patient not taking: Reported on 09/15/2023), Disp: , Rfl:    Zinc Gluconate 100 MG TABS, 1 tablet Orally Once a day for health maintenance (Patient not taking: Reported on 09/15/2023), Disp: , Rfl:   Past Medical History: Past Medical History:  Diagnosis Date   Anxiety    Arrhythmia    CHF (congestive heart failure) (HCC)    Hypertension    Kidney stones    Shortness of breath dyspnea     Tobacco Use: Social History   Tobacco Use  Smoking Status Never  Smokeless Tobacco Never  Tobacco Comments   Never smoke 06/21/22    Labs: Review Flowsheet       Latest Ref Rng & Units  11/19/2014 02/10/2022  Labs for ITP Cardiac and Pulmonary Rehab  Cholestrol 0 - 200 mg/dL 161  -  LDL (calc) 0 - 99 mg/dL 096  -  HDL-C >04 mg/dL 40  -  Trlycerides <540 mg/dL 981  -  PH, Arterial 1.91 - 7.45 - 7.447   PCO2 arterial 32 - 48 mmHg - 32.7   Bicarbonate 20.0 - 28.0 mmol/L - 22.6  23.6   TCO2 22 - 32 mmol/L - 24  25   Acid-base deficit 0.0 - 2.0 mmol/L - 1.0  1.0   O2 Saturation % - 96  67     Details       Multiple values from one  day are sorted in reverse-chronological order          Exercise Target Goals: Exercise Program Goal: Individual exercise prescription set using results from initial 6 min walk test and THRR while considering  patient's activity barriers and safety.   Exercise Prescription Goal: Initial exercise prescription builds to 30-45 minutes a day of aerobic activity, 2-3 days per week.  Home exercise guidelines will be given to patient during program as part of exercise prescription that the participant will acknowledge.   Education: Aerobic Exercise: - Group verbal and visual presentation on the components of exercise prescription. Introduces F.I.T.T principle from ACSM for exercise prescriptions.  Reviews F.I.T.T. principles of aerobic exercise including progression. Written material given at graduation.   Education: Resistance Exercise: - Group verbal and visual presentation on the components of exercise prescription. Introduces F.I.T.T principle from ACSM for exercise prescriptions  Reviews F.I.T.T. principles of resistance exercise including progression. Written material given at graduation.    Education: Exercise & Equipment Safety: - Individual verbal instruction and demonstration of equipment use and safety with use of the equipment. Flowsheet Row Cardiac Rehab from 09/15/2023 in [facility] Cardiac [facility] Pulmonary Rehab  Date 09/15/23  Educator MB  Instruction Review Code 1- Verbalizes Understanding    Education: Exercise Physiology & General  Exercise Guidelines: - Group verbal and written instruction with models to review the exercise physiology of the cardiovascular system and associated critical values. Provides general exercise guidelines with specific guidelines to those with heart or lung disease.    Education: Flexibility, Balance, Mind/Body Relaxation: - Group verbal and visual presentation with interactive activity on the components of exercise prescription. Introduces F.I.T.T principle from ACSM for exercise prescriptions. Reviews F.I.T.T. principles of flexibility and balance exercise training including progression. Also discusses the mind body connection.  Reviews various relaxation techniques to help reduce and manage stress (i.e. Deep breathing, progressive muscle relaxation, and visualization). Balance handout provided to take home. Written material given at graduation.   Activity Barriers & Risk Stratification:  Activity Barriers & Cardiac Risk Stratification - 09/15/23 1625       Activity Barriers & Cardiac Risk Stratification   Activity Barriers Muscular Weakness;Deconditioning    Cardiac Risk Stratification High          6 Minute Walk:  6 Minute Walk     Row Name 09/15/23 1625         6 Minute Walk   Phase Initial     Distance 1190 feet     Walk Time 6 minutes     # of Rest Breaks 0     MPH 2.25     METS 2.88     RPE 9     Perceived Dyspnea  0     VO2 Peak 10.09     Symptoms No     Resting HR 63 bpm     Resting BP 118/70     Resting Oxygen Saturation  99 %     Exercise Oxygen Saturation  during 6 min walk 96 %     Max Ex. HR 95 bpm     Max Ex. BP 128/80     2 Minute Post BP 118/72        Oxygen Initial Assessment:   Oxygen Re-Evaluation:   Oxygen Discharge (Final Oxygen Re-Evaluation):   Initial Exercise Prescription:  Initial Exercise Prescription - 09/15/23 1600       Date of Initial Exercise RX and Referring Provider   Date 09/15/23    Referring  Provider Jules Oar, MD      Oxygen   Maintain Oxygen Saturation 88% or higher      Treadmill   MPH 2.2    Grade 0    Minutes 15    METs 2.68      Recumbant Bike   Level 2    RPM 50    Watts 25    Minutes 15    METs 2.88      NuStep   Level 2    SPM 80    Minutes 15    METs 2.88      REL-XR   Level 1    Speed 50    Minutes 15    METs 2.88      T5 Nustep   Level 2    SPM 80    Minutes 15    METs 2.88      Prescription Details   Frequency (times per week) 3    Duration Progress to 30 minutes of continuous aerobic without signs/symptoms of physical distress      Intensity   THRR 40-80% of Max Heartrate 99-135    Ratings of Perceived Exertion 11-13    Perceived Dyspnea 0-4      Progression   Progression Continue to progress workloads to maintain intensity without signs/symptoms of physical distress.      Resistance Training   Training Prescription Yes    Weight 5 lb    Reps 10-15          Perform Capillary Blood Glucose checks as needed.  Exercise Prescription Changes:   Exercise Prescription Changes     Row Name 09/15/23 1600             Response to Exercise   Blood Pressure (Admit) 118/70       Blood Pressure (Exercise) 128/80       Blood Pressure (Exit) 118/72       Heart Rate (Admit) 63 bpm       Heart Rate (Exercise) 95 bpm       Heart Rate (Exit) 66 bpm       Oxygen Saturation (Admit) 99 %       Oxygen Saturation (Exercise) 96 %       Oxygen Saturation (Exit) 98 %       Rating of Perceived Exertion (Exercise) 9       Perceived Dyspnea (Exercise) 0       Symptoms none       Comments results       Intensity THRR New         Progression   Average METs 2.88          Exercise Comments:   Exercise Comments     Row Name 09/26/23 1353           Exercise Comments First full day of exercise!  Patient was oriented to gym and equipment including functions, settings, policies, and procedures.  Patient's individual exercise prescription  and treatment plan were reviewed.  All starting workloads were established based on the results of the 6 minute walk test done at initial orientation visit.  The plan for exercise progression was also introduced and progression will be customized based on patient's performance and goals.          Exercise Goals and Review:   Exercise Goals     Row Name 09/15/23 1629             Exercise Goals  Increase Physical Activity Yes       Intervention Provide advice, education, support and counseling about physical activity/exercise needs.;Develop an individualized exercise prescription for aerobic and resistive training based on initial evaluation findings, risk stratification, comorbidities and participant's personal goals.       Expected Outcomes Short Term: Attend rehab on a regular basis to increase amount of physical activity.;Long Term: Add in home exercise to make exercise part of routine and to increase amount of physical activity.;Long Term: Exercising regularly at least 3-5 days a week.       Increase Strength and Stamina Yes       Intervention Provide advice, education, support and counseling about physical activity/exercise needs.;Develop an individualized exercise prescription for aerobic and resistive training based on initial evaluation findings, risk stratification, comorbidities and participant's personal goals.       Expected Outcomes Short Term: Increase workloads from initial exercise prescription for resistance, speed, and METs.;Short Term: Perform resistance training exercises routinely during rehab and add in resistance training at home;Long Term: Improve cardiorespiratory fitness, muscular endurance and strength as measured by increased METs and functional capacity ( )       Able to understand and use rate of perceived exertion (RPE) scale Yes       Intervention Provide education and explanation on how to use RPE scale       Expected Outcomes Short Term: Able to use RPE  daily in rehab to express subjective intensity level;Long Term:  Able to use RPE to guide intensity level when exercising independently       Able to understand and use Dyspnea scale Yes       Intervention Provide education and explanation on how to use Dyspnea scale       Expected Outcomes Short Term: Able to use Dyspnea scale daily in rehab to express subjective sense of shortness of breath during exertion;Long Term: Able to use Dyspnea scale to guide intensity level when exercising independently       Knowledge and understanding of Target Heart Rate Range (THRR) Yes       Intervention Provide education and explanation of THRR including how the numbers were predicted and where they are located for reference       Expected Outcomes Short Term: Able to state/look up THRR;Short Term: Able to use daily as guideline for intensity in rehab;Long Term: Able to use THRR to govern intensity when exercising independently       Able to check pulse independently Yes       Intervention Provide education and demonstration on how to check pulse in carotid and radial arteries.;Review the importance of being able to check your own pulse for safety during independent exercise       Expected Outcomes Short Term: Able to explain why pulse checking is important during independent exercise;Long Term: Able to check pulse independently and accurately       Understanding of Exercise Prescription Yes       Intervention Provide education, explanation, and written materials on patient's individual exercise prescription       Expected Outcomes Short Term: Able to explain program exercise prescription;Long Term: Able to explain home exercise prescription to exercise independently          Exercise Goals Re-Evaluation :  Exercise Goals Re-Evaluation     Row Name 09/26/23 1353             Exercise Goal Re-Evaluation   Exercise Goals Review Able to understand and use rate of perceived  exertion (RPE) scale;Increase  Physical Activity;Knowledge and understanding of Target Heart Rate Range (THRR);Understanding of Exercise Prescription;Increase Strength and Stamina;Able to check pulse independently;Able to understand and use Dyspnea scale       Comments Reviewed RPE and dyspnea scale, THR and program prescription with pt today.  Pt voiced understanding and was given a copy of goals to take home.       Expected Outcomes Short: Use RPE daily to regulate intensity. Long: Follow program prescription in THR.          Discharge Exercise Prescription (Final Exercise Prescription Changes):  Exercise Prescription Changes - 09/15/23 1600       Response to Exercise   Blood Pressure (Admit) 118/70    Blood Pressure (Exercise) 128/80    Blood Pressure (Exit) 118/72    Heart Rate (Admit) 63 bpm    Heart Rate (Exercise) 95 bpm    Heart Rate (Exit) 66 bpm    Oxygen Saturation (Admit) 99 %    Oxygen Saturation (Exercise) 96 %    Oxygen Saturation (Exit) 98 %    Rating of Perceived Exertion (Exercise) 9    Perceived Dyspnea (Exercise) 0    Symptoms none    Comments results    Intensity THRR New      Progression   Average METs 2.88          Nutrition:  Target Goals: Understanding of nutrition guidelines, daily intake of sodium 1500mg , cholesterol 200mg , calories 30% from fat and 7% or less from saturated fats, daily to have 5 or more servings of fruits and vegetables.  Education: All About Nutrition: -Group instruction provided by verbal, written material, interactive activities, discussions, models, and posters to present general guidelines for heart healthy nutrition including fat, fiber, MyPlate, the role of sodium in heart healthy nutrition, utilization of the nutrition label, and utilization of this knowledge for meal planning. Follow up email sent as well. Written material given at graduation.   Biometrics:  Pre Biometrics - 09/15/23 1629       Pre Biometrics   Height 5' 10.5 (1.791 m)     Weight 191 lb 11.2 oz (87 kg)    Waist Circumference 38 inches    Hip Circumference 39 inches    Waist to Hip Ratio 0.97 %    BMI (Calculated) 27.11    Single Leg Stand 30 seconds           Nutrition Therapy Plan and Nutrition Goals:  Nutrition Therapy & Goals - 09/15/23 1630       Personal Nutrition Goals   Nutrition Goal Will meet with RD on 6/18      Intervention Plan   Intervention Prescribe, educate and counsel regarding individualized specific dietary modifications aiming towards targeted core components such as weight, hypertension, lipid management, diabetes, heart failure and other comorbidities.;Nutrition handout(s) given to patient.    Expected Outcomes Short Term Goal: Understand basic principles of dietary content, such as calories, fat, sodium, cholesterol and nutrients.;Short Term Goal: A plan has been developed with personal nutrition goals set during dietitian appointment.;Long Term Goal: Adherence to prescribed nutrition plan.          Nutrition Assessments:  MEDIFICTS Score Key: >=70 Need to make dietary changes  40-70 Heart Healthy Diet <= 40 Therapeutic Level Cholesterol Diet   Picture Your Plate Scores: <28 Unhealthy dietary pattern with much room for improvement. 41-50 Dietary pattern unlikely to meet recommendations for good health and room for improvement. 51-60 More healthful dietary  pattern, with some room for improvement.  >60 Healthy dietary pattern, although there may be some specific behaviors that could be improved.    Nutrition Goals Re-Evaluation:   Nutrition Goals Discharge (Final Nutrition Goals Re-Evaluation):   Psychosocial: Target Goals: Acknowledge presence or absence of significant depression and/or stress, maximize coping skills, provide positive support system. Participant is able to verbalize types and ability to use techniques and skills needed for reducing stress and depression.   Education: Stress, Anxiety, and  Depression - Group verbal and visual presentation to define topics covered.  Reviews how body is impacted by stress, anxiety, and depression.  Also discusses healthy ways to reduce stress and to treat/manage anxiety and depression.  Written material given at graduation.   Education: Sleep Hygiene -Provides group verbal and written instruction about how sleep can affect your health.  Define sleep hygiene, discuss sleep cycles and impact of sleep habits. Review good sleep hygiene tips.    Initial Review & Psychosocial Screening:  Initial Psych Review & Screening - 09/15/23 1407       Initial Review   Current issues with Current Anxiety/Panic      Family Dynamics   Good Support System? Yes    Comments Deni can look to his wife inlaws and kids for support. He states no mental instability but can get anxious at times.      Barriers   Psychosocial barriers to participate in program There are no identifiable barriers or psychosocial needs.;The patient should benefit from training in stress management and relaxation.      Screening Interventions   Interventions To provide support and resources with identified psychosocial needs;Encouraged to exercise;Provide feedback about the scores to participant    Expected Outcomes Short Term goal: Utilizing psychosocial counselor, staff and physician to assist with identification of specific Stressors or current issues interfering with healing process. Setting desired goal for each stressor or current issue identified.;Long Term Goal: Stressors or current issues are controlled or eliminated.;Short Term goal: Identification and review with participant of any Quality of Life or Depression concerns found by scoring the questionnaire.;Long Term goal: The participant improves quality of Life and PHQ9 Scores as seen by post scores and/or verbalization of changes          Quality of Life Scores:   Scores of 19 and below usually indicate a poorer quality of  life in these areas.  A difference of  2-3 points is a clinically meaningful difference.  A difference of 2-3 points in the total score of the Quality of Life Index has been associated with significant improvement in overall quality of life, self-image, physical symptoms, and general health in studies assessing change in quality of life.  PHQ-9: Review Flowsheet       09/15/2023 01/13/2017 12/09/2016  Depression screen PHQ 2/9  Decreased Interest 0 0 0  Down, Depressed, Hopeless 0 0 1  PHQ - 2 Score 0 0 1  Altered sleeping 3 - -  Tired, decreased energy 2 - -  Change in appetite 3 - -  Feeling bad or failure about yourself  0 - -  Trouble concentrating 0 - -  Moving slowly or fidgety/restless 0 - -  Suicidal thoughts 0 - -  PHQ-9 Score 8 - -  Difficult doing work/chores Somewhat difficult - -   Interpretation of Total Score  Total Score Depression Severity:  1-4 = Minimal depression, 5-9 = Mild depression, 10-14 = Moderate depression, 15-19 = Moderately severe depression, 20-27 = Severe depression  Psychosocial Evaluation and Intervention:  Psychosocial Evaluation - 09/15/23 1408       Psychosocial Evaluation & Interventions   Interventions Relaxation education;Encouraged to exercise with the program and follow exercise prescription;Stress management education    Comments Stephanie can look to his wife inlaws and kids for support. He states no mental instability but can get anxious at times.    Expected Outcomes Short: Start [facility] to help with mood. Long: Maintain a healthy mental state    Continue Psychosocial Services  Follow up required by staff          Psychosocial Re-Evaluation:   Psychosocial Discharge (Final Psychosocial Re-Evaluation):   Vocational Rehabilitation: Provide vocational rehab assistance to qualifying candidates.   Vocational Rehab Evaluation & Intervention:   Education: Education Goals: Education classes will be provided on a variety of topics  geared toward better understanding of heart health and risk factor modification. Participant will state understanding/return demonstration of topics presented as noted by education test scores.  Learning Barriers/Preferences:   General Cardiac Education Topics:  AED/CPR: - Group verbal and written instruction with the use of models to demonstrate the basic use of the AED with the basic ABC's of resuscitation.   Anatomy and Cardiac Procedures: - Group verbal and visual presentation and models provide information about basic cardiac anatomy and function. Reviews the testing methods done to diagnose heart disease and the outcomes of the test results. Describes the treatment choices: Medical Management, Angioplasty, or Coronary Bypass Surgery for treating various heart conditions including Myocardial Infarction, Angina, Valve Disease, and Cardiac Arrhythmias.  Written material given at graduation.   Medication Safety: - Group verbal and visual instruction to review commonly prescribed medications for heart and lung disease. Reviews the medication, class of the drug, and side effects. Includes the steps to properly store meds and maintain the prescription regimen.  Written material given at graduation.   Intimacy: - Group verbal instruction through game format to discuss how heart and lung disease can affect sexual intimacy. Written material given at graduation..   Know Your Numbers and Heart Failure: - Group verbal and visual instruction to discuss disease risk factors for cardiac and pulmonary disease and treatment options.  Reviews associated critical values for Overweight/Obesity, Hypertension, Cholesterol, and Diabetes.  Discusses basics of heart failure: signs/symptoms and treatments.  Introduces Heart Failure Zone chart for action plan for heart failure.  Written material given at graduation.   Infection Prevention: - Provides verbal and written material to individual with discussion of  infection control including proper hand washing and proper equipment cleaning during exercise session. Flowsheet Row Cardiac Rehab from 09/15/2023 in [facility] Cardiac [facility]  Date 09/15/23  Educator MB  Instruction Review Code 1- Verbalizes Understanding    Falls Prevention: - Provides verbal and written material to individual with discussion of falls prevention and safety. Flowsheet Row Cardiac Rehab from 09/15/2023 in [facility] Card[facility] Pul[facility] Rehab  Date 09/15/23  Educator MB  Instruction Review Code 1- Verbalizes Understanding    Other: -Provides group and verbal instruction on various topics (see comments)   Knowledge Questionnaire Score:   Core Components/Risk Factors/Patient Goals at Admission:  Personal Goals and Risk Factors at Admission - 09/15/23 1404       Core Components/Risk Factors/Patient Goals on Admission    Weight Management Yes;Weight Maintenance    Intervention Weight Management: Develop a combined nutrition and exercise program designed to reach desired caloric intake, while maintaining appropriate intake of nutrient and fiber, sodium and fats, and  appropriate energy expenditure required for the weight goal.;Weight Management: Provide education and appropriate resources to help participant work on and attain dietary goals.;Weight Management/Obesity: Establish reasonable short term and long term weight goals.    Admit Weight 191 lb 11.2 oz (87 kg)    Goal Weight: Short Term 190 lb (86.2 kg)    Goal Weight: Long Term 190 lb (86.2 kg)    Expected Outcomes Short Term: Continue to assess and modify interventions until short term weight is achieved;Long Term: Adherence to nutrition and physical activity/exercise program aimed toward attainment of established weight goal;Weight Maintenance: Understanding of the daily nutrition guidelines, which includes 25-35% calories from fat, 7% or less cal from saturated fats, less than 200mg  cholesterol, less than 1.5gm of  sodium, & 5 or more servings of fruits and vegetables daily;Understanding recommendations for meals to include 15-35% energy as protein, 25-35% energy from fat, 35-60% energy from carbohydrates, less than 200mg  of dietary cholesterol, 20-35 gm of total fiber daily;Understanding of distribution of calorie intake throughout the day with the consumption of 4-5 meals/snacks    Heart Failure Yes    Intervention Provide a combined exercise and nutrition program that is supplemented with education, support and counseling about heart failure. Directed toward relieving symptoms such as shortness of breath, decreased exercise tolerance, and extremity edema.    Expected Outcomes Improve functional capacity of life;Short term: Attendance in program 2-3 days a week with increased exercise capacity. Reported lower sodium intake. Reported increased fruit and vegetable intake. Reports medication compliance.;Short term: Daily weights obtained and reported for increase. Utilizing diuretic protocols set by physician.;Long term: Adoption of self-care skills and reduction of barriers for early signs and symptoms recognition and intervention leading to self-care maintenance.    Hypertension Yes    Intervention Provide education on lifestyle modifcations including regular physical activity/exercise, weight management, moderate sodium restriction and increased consumption of fresh fruit, vegetables, and low fat dairy, alcohol moderation, and smoking cessation.;Monitor prescription use compliance.    Expected Outcomes Short Term: Continued assessment and intervention until BP is < 140/42mm HG in hypertensive participants. < 130/69mm HG in hypertensive participants with diabetes, heart failure or chronic kidney disease.;Long Term: Maintenance of blood pressure at goal levels.    Lipids Yes    Intervention Provide education and support for participant on nutrition & aerobic/resistive exercise along with prescribed medications to  achieve LDL 70mg , HDL >40mg .    Expected Outcomes Short Term: Participant states understanding of desired cholesterol values and is compliant with medications prescribed. Participant is following exercise prescription and nutrition guidelines.;Long Term: Cholesterol controlled with medications as prescribed, with individualized exercise RX and with personalized nutrition plan. Value goals: LDL < 70mg , HDL > 40 mg.          Education:Diabetes - Individual verbal and written instruction to review signs/symptoms of diabetes, desired ranges of glucose level fasting, after meals and with exercise. Acknowledge that pre and post exercise glucose checks will be done for 3 sessions at entry of program.   Core Components/Risk Factors/Patient Goals Review:    Core Components/Risk Factors/Patient Goals at Discharge (Final Review):    ITP Comments:  ITP Comments     Row Name 09/15/23 1624 09/21/23 0930 09/26/23 1352 10/06/23 1135     ITP Comments Completed and gym orientation for cardiac rehab. Initial ITP created and sent for review to Dr. Firman Hughes, Medical Director. 30 Day review completed. Medical Director ITP review done, changes made as directed, and signed approval by Medical Director.  new to program First full day of exercise!  Patient was oriented to gym and equipment including functions, settings, policies, and procedures.  Patient's individual exercise prescription and treatment plan were reviewed.  All starting workloads were established based on the results of the 6 minute walk test done at initial orientation visit.  The plan for exercise progression was also introduced and progression will be customized based on patient's performance and goals. discharged per patient request   cannot continue due to work schedule       Comments: discharge early per patient request

## 2023-10-06 NOTE — Progress Notes (Signed)
 Discharge Note for  Jerry Howe     [birth year]          Discharged after 2 visits.  Back to work     6 Minute Walk     Row Name 09/15/23 1625         6 Minute Walk   Phase Initial     Distance 1190 feet     Walk Time 6 minutes     # of Rest Breaks 0     MPH 2.25     METS 2.88     RPE 9     Perceived Dyspnea  0     VO2 Peak 10.09     Symptoms No     Resting HR 63 bpm     Resting BP 118/70     Resting Oxygen Saturation  99 %     Exercise Oxygen Saturation  during 6 min walk 96 %     Max Ex. HR 95 bpm     Max Ex. BP 128/80     2 Minute Post BP 118/72

## 2023-10-07 ENCOUNTER — Encounter: Payer: Self-pay | Admitting: *Deleted

## 2023-10-07 NOTE — Progress Notes (Signed)
 Discharge Note for  Jerry Howe     March 18, 1956        Discharged per patient request.  Attended 3 sessions      6 Minute Walk     Row Name 09/15/23 1625         6 Minute Walk   Phase Initial     Distance 1190 feet     Walk Time 6 minutes     # of Rest Breaks 0     MPH 2.25     METS 2.88     RPE 9     Perceived Dyspnea  0     VO2 Peak 10.09     Symptoms No     Resting HR 63 bpm     Resting BP 118/70     Resting Oxygen Saturation  99 %     Exercise Oxygen Saturation  during 6 min walk 96 %     Max Ex. HR 95 bpm     Max Ex. BP 128/80     2 Minute Post BP 118/72

## 2023-10-07 NOTE — Progress Notes (Signed)
 Cardiac Individual Treatment Plan  Patient Details  Name: Jerry Howe MRN: [id number] Date of Birth: 1955-09-15 Referring Provider:   Flowsheet Row Cardiac Rehab from 09/15/2023 in [facility]  Referring Provider Jules Oar, MD    Initial Encounter Date:  Flowsheet Row Cardiac Rehab from 09/15/2023 in [facility] Cardiac and Pulmonary Rehab  Date 09/15/23    Visit Diagnosis: No diagnosis found.  Patient's Home Medications on Admission:  Current Outpatient Medications:    albuterol  (PROVENTIL  HFA;VENTOLIN  HFA) 108 (90 Base) MCG/ACT inhaler, Inhale 1 puff into the lungs every 6 (six) hours as needed for wheezing or shortness of breath. (Patient not taking: Reported on 09/15/2023), Disp: , Rfl:    albuterol  (VENTOLIN  HFA) 108 (90 Base) MCG/ACT inhaler, 2 puffs., Disp: , Rfl:    amiodarone  (PACERONE ) 200 MG tablet, Take 1 tablet (200 mg total) by mouth daily., Disp: 30 tablet, Rfl: 2   amiodarone  (PACERONE ) 200 MG tablet, 1 tablet Orally Once a day for 30 days (Patient not taking: Reported on 09/15/2023), Disp: , Rfl:    apixaban  (ELIQUIS ) 5 MG TABS tablet, Take 1 tablet (5 mg total) by mouth 2 (two) times daily., Disp: 180 tablet, Rfl: 3   apixaban  (ELIQUIS ) 5 MG TABS tablet, 1 tablet Oral twice a day for abnormal heart rhythm (Patient not taking: Reported on 09/15/2023), Disp: , Rfl:    Ascorbic Acid  (VITAMIN C ) 1000 MG tablet, Take 1,000 mg by mouth daily., Disp: , Rfl:    ascorbic acid  (VITAMIN C ) 1000 MG tablet, 1 tablet Orally Once a day for health maintenance (Patient not taking: Reported on 09/15/2023), Disp: , Rfl:    Cholecalciferol 125 MCG (5000 UT) CHEW, 1 capsule., Disp: , Rfl:    cyanocobalamin  2000 MCG tablet, Take 2,000 mcg by mouth daily., Disp: , Rfl:    empagliflozin  (JARDIANCE ) 10 MG TABS tablet, Take 1 tablet (10 mg total) by mouth daily., Disp: 30 tablet, Rfl: 2   empagliflozin  (JARDIANCE ) 10 MG TABS tablet, 1 tablet Orally Once a day for 30 days for  heart (Patient not taking: Reported on 09/15/2023), Disp: , Rfl:    EPINEPHrine (ADRENALIN) 30 MG/30ML SOLN injection, as directed Injection As needed for severe allergic reaction (Patient not taking: Reported on 09/15/2023), Disp: , Rfl:    EPINEPHrine 0.3 mg/0.3 mL IJ SOAJ injection, Inject 0.3 mg IM single dose for 90 days may repeat every ~5 to 15 minutes if needed for allergic reaction, Disp: , Rfl:    fluticasone  (FLONASE ) 50 MCG/ACT nasal spray, Place 1 spray into both nostrils in the morning and at bedtime. (Patient not taking: Reported on 09/15/2023), Disp: , Rfl:    fluticasone  (FLONASE ) 50 MCG/ACT nasal spray, 1 spray in each nostril Nasally Twice a day for 30 days for allergy symptoms, Disp: , Rfl:    glucose blood test strip, Use as directed up to 3 times a day to check blood sugar for 100 days (Patient not taking: Reported on 09/15/2023), Disp: , Rfl:    levocetirizine (XYZAL) 5 MG tablet, 1 tablet in the evening Orally Once a day for 100 days for allergy symptoms, Disp: , Rfl:    Magnesium  200 MG TABS, Take 200 mg by mouth daily., Disp: , Rfl:    menthol -cetylpyridinium (CEPACOL) 3 MG lozenge, Take 1 lozenge (3 mg total) by mouth as needed for sore throat., Disp: 30 tablet, Rfl: 0   metoprolol  succinate (TOPROL -XL) 25 MG 24 hr tablet, Take 25 mg by mouth daily., Disp: ,  Rfl:    metoprolol  succinate (TOPROL -XL) 25 MG 24 hr tablet, 1 tablet Orally Once a day for heart/atrial fibrillation. Hold if heart rate less than 60. (Patient not taking: Reported on 09/15/2023), Disp: , Rfl:    Multiple Vitamins-Minerals (SUPER THERA VITE M PO), Take 1 tablet by mouth daily., Disp: , Rfl:    Naphazoline-Glycerin (REDNESS RELIEF OP), Place 1 drop into both eyes 2 (two) times daily., Disp: , Rfl:    omeprazole (PRILOSEC) 20 MG capsule, Take 20 mg by mouth daily., Disp: , Rfl:    omeprazole (PRILOSEC) 20 MG capsule, 1 capsule. (Patient not taking: Reported on 09/15/2023), Disp: , Rfl:     promethazine-dextromethorphan (PROMETHAZINE-DM) 6.25-15 MG/5ML syrup, Take 5 mLs by mouth 4 (four) times daily as needed for cough., Disp: , Rfl:    promethazine-dextromethorphan (PROMETHAZINE-DM) 6.25-15 MG/5ML syrup, 5 mL Oral every 6 hours for 6 days As needed cough, Disp: , Rfl:    pyridoxine  (B-6) 500 MG tablet, Take 500 mg by mouth daily., Disp: , Rfl:    rosuvastatin  (CRESTOR ) 10 MG tablet, 1 tablet Orally Once a day for 100 days for high cholesterol/heart disease, Disp: , Rfl:    sacubitril -valsartan  (ENTRESTO ) 24-26 MG, 1 tablet Orally Twice a day for 90 days for heart (Patient not taking: Reported on 09/15/2023), Disp: , Rfl:    sacubitril -valsartan  (ENTRESTO ) 49-51 MG, Take 1 tablet by mouth 2 (two) times daily., Disp: 180 tablet, Rfl: 3   spironolactone  (ALDACTONE ) 25 MG tablet, Take 1 tablet (25 mg total) by mouth daily. (Patient not taking: Reported on 09/15/2023), Disp: 90 tablet, Rfl: 3   spironolactone  (ALDACTONE ) 25 MG tablet, 1 tablet Orally once a day for heart/blood pressure, Disp: , Rfl:    torsemide  (DEMADEX ) 20 MG tablet, Take 1 tablet (20 mg total) by mouth daily as needed., Disp: 90 tablet, Rfl: 3   torsemide  (DEMADEX ) 20 MG tablet, 1/2 tablet Orally Once a day for heart/excess fluid (Patient not taking: Reported on 09/15/2023), Disp: , Rfl:    Vitamin E 180 MG (400 UNIT) CAPS, 1 capsule. (Patient not taking: Reported on 09/15/2023), Disp: , Rfl:    Zinc Gluconate 100 MG TABS, 1 tablet Orally Once a day for health maintenance (Patient not taking: Reported on 09/15/2023), Disp: , Rfl:   Past Medical History: Past Medical History:  Diagnosis Date   Anxiety    Arrhythmia    CHF (congestive heart failure) (HCC)    Hypertension    Kidney stones    Shortness of breath dyspnea     Tobacco Use: Social History   Tobacco Use  Smoking Status Never  Smokeless Tobacco Never  Tobacco Comments   Never smoke 06/21/22    Labs: Review Flowsheet       Latest Ref Rng & Units  11/19/2014 02/10/2022  Labs for ITP Cardiac and Pulmonary Rehab  Cholestrol 0 - 200 mg/dL 696  -  LDL (calc) 0 - 99 mg/dL 295  -  HDL-C >28 mg/dL 40  -  Trlycerides <413 mg/dL 244  -  PH, Arterial 0.10 - 7.45 - 7.447   PCO2 arterial 32 - 48 mmHg - 32.7   Bicarbonate 20.0 - 28.0 mmol/L - 22.6  23.6   TCO2 22 - 32 mmol/L - 24  25   Acid-base deficit 0.0 - 2.0 mmol/L - 1.0  1.0   O2 Saturation % - 96  67     Details       Multiple values from one day are  sorted in reverse-chronological order          Exercise Target Goals: Exercise Program Goal: Individual exercise prescription set using results from initial 6 min walk test and THRR while considering  patient's activity barriers and safety.   Exercise Prescription Goal: Initial exercise prescription builds to 30-45 minutes a day of aerobic activity, 2-3 days per week.  Home exercise guidelines will be given to patient during program as part of exercise prescription that the participant will acknowledge.   Education: Aerobic Exercise: - Group verbal and visual presentation on the components of exercise prescription. Introduces F.I.T.T principle from ACSM for exercise prescriptions.  Reviews F.I.T.T. principles of aerobic exercise including progression. Written material given at graduation.   Education: Resistance Exercise: - Group verbal and visual presentation on the components of exercise prescription. Introduces F.I.T.T principle from ACSM for exercise prescriptions  Reviews F.I.T.T. principles of resistance exercise including progression. Written material given at graduation.    Education: Exercise & Equipment Safety: - Individual verbal instruction and demonstration of equipment use and safety with use of the equipment. Flowsheet Row Cardiac Rehab from 09/15/2023 in [facility] Cardiac [facility] Pulmonary Rehab  Date 09/15/23  Educator MB  Instruction Review Code 1- Verbalizes Understanding    Education: Exercise Physiology & General  Exercise Guidelines: - Group verbal and written instruction with models to review the exercise physiology of the cardiovascular system and associated critical values. Provides general exercise guidelines with specific guidelines to those with heart or lung disease.    Education: Flexibility, Balance, Mind/Body Relaxation: - Group verbal and visual presentation with interactive activity on the components of exercise prescription. Introduces F.I.T.T principle from ACSM for exercise prescriptions. Reviews F.I.T.T. principles of flexibility and balance exercise training including progression. Also discusses the mind body connection.  Reviews various relaxation techniques to help reduce and manage stress (i.e. Deep breathing, progressive muscle relaxation, and visualization). Balance handout provided to take home. Written material given at graduation.   Activity Barriers & Risk Stratification:  Activity Barriers & Cardiac Risk Stratification - 09/15/23 1625       Activity Barriers & Cardiac Risk Stratification   Activity Barriers Muscular Weakness;Deconditioning    Cardiac Risk Stratification High          6 Minute Walk:  6 Minute Walk     Row Name 09/15/23 1625         6 Minute Walk   Phase Initial     Distance 1190 feet     Walk Time 6 minutes     # of Rest Breaks 0     MPH 2.25     METS 2.88     RPE 9     Perceived Dyspnea  0     VO2 Peak 10.09     Symptoms No     Resting HR 63 bpm     Resting BP 118/70     Resting Oxygen Saturation  99 %     Exercise Oxygen Saturation  during 6 min walk 96 %     Max Ex. HR 95 bpm     Max Ex. BP 128/80     2 Minute Post BP 118/72        Oxygen Initial Assessment:   Oxygen Re-Evaluation:   Oxygen Discharge (Final Oxygen Re-Evaluation):   Initial Exercise Prescription:  Initial Exercise Prescription - 09/15/23 1600       Date of Initial Exercise RX and Referring Provider   Date 09/15/23    Referring Provider Bensimhon,  Bearl Limes, MD      Oxygen   Maintain Oxygen Saturation 88% or higher      Treadmill   MPH 2.2    Grade 0    Minutes 15    METs 2.68      Recumbant Bike   Level 2    RPM 50    Watts 25    Minutes 15    METs 2.88      NuStep   Level 2    SPM 80    Minutes 15    METs 2.88      REL-XR   Level 1    Speed 50    Minutes 15    METs 2.88      T5 Nustep   Level 2    SPM 80    Minutes 15    METs 2.88      Prescription Details   Frequency (times per week) 3    Duration Progress to 30 minutes of continuous aerobic without signs/symptoms of physical distress      Intensity   THRR 40-80% of Max Heartrate 99-135    Ratings of Perceived Exertion 11-13    Perceived Dyspnea 0-4      Progression   Progression Continue to progress workloads to maintain intensity without signs/symptoms of physical distress.      Resistance Training   Training Prescription Yes    Weight 5 lb    Reps 10-15          Perform Capillary Blood Glucose checks as needed.  Exercise Prescription Changes:   Exercise Prescription Changes     Row Name 09/15/23 1600             Response to Exercise   Blood Pressure (Admit) 118/70       Blood Pressure (Exercise) 128/80       Blood Pressure (Exit) 118/72       Heart Rate (Admit) 63 bpm       Heart Rate (Exercise) 95 bpm       Heart Rate (Exit) 66 bpm       Oxygen Saturation (Admit) 99 %       Oxygen Saturation (Exercise) 96 %       Oxygen Saturation (Exit) 98 %       Rating of Perceived Exertion (Exercise) 9       Perceived Dyspnea (Exercise) 0       Symptoms none       Comments results       Intensity THRR New         Progression   Average METs 2.88          Exercise Comments:   Exercise Comments     Row Name 09/26/23 1353           Exercise Comments First full day of exercise!  Patient was oriented to gym and equipment including functions, settings, policies, and procedures.  Patient's individual exercise prescription  and treatment plan were reviewed.  All starting workloads were established based on the results of the 6 minute walk test done at initial orientation visit.  The plan for exercise progression was also introduced and progression will be customized based on patient's performance and goals.          Exercise Goals and Review:   Exercise Goals     Row Name 09/15/23 1629             Exercise Goals   Increase  Physical Activity Yes       Intervention Provide advice, education, support and counseling about physical activity/exercise needs.;Develop an individualized exercise prescription for aerobic and resistive training based on initial evaluation findings, risk stratification, comorbidities and participant's personal goals.       Expected Outcomes Short Term: Attend rehab on a regular basis to increase amount of physical activity.;Long Term: Add in home exercise to make exercise part of routine and to increase amount of physical activity.;Long Term: Exercising regularly at least 3-5 days a week.       Increase Strength and Stamina Yes       Intervention Provide advice, education, support and counseling about physical activity/exercise needs.;Develop an individualized exercise prescription for aerobic and resistive training based on initial evaluation findings, risk stratification, comorbidities and participant's personal goals.       Expected Outcomes Short Term: Increase workloads from initial exercise prescription for resistance, speed, and METs.;Short Term: Perform resistance training exercises routinely during rehab and add in resistance training at home;Long Term: Improve cardiorespiratory fitness, muscular endurance and strength as measured by increased METs and functional capacity ( )       Able to understand and use rate of perceived exertion (RPE) scale Yes       Intervention Provide education and explanation on how to use RPE scale       Expected Outcomes Short Term: Able to use RPE  daily in rehab to express subjective intensity level;Long Term:  Able to use RPE to guide intensity level when exercising independently       Able to understand and use Dyspnea scale Yes       Intervention Provide education and explanation on how to use Dyspnea scale       Expected Outcomes Short Term: Able to use Dyspnea scale daily in rehab to express subjective sense of shortness of breath during exertion;Long Term: Able to use Dyspnea scale to guide intensity level when exercising independently       Knowledge and understanding of Target Heart Rate Range (THRR) Yes       Intervention Provide education and explanation of THRR including how the numbers were predicted and where they are located for reference       Expected Outcomes Short Term: Able to state/look up THRR;Short Term: Able to use daily as guideline for intensity in rehab;Long Term: Able to use THRR to govern intensity when exercising independently       Able to check pulse independently Yes       Intervention Provide education and demonstration on how to check pulse in carotid and radial arteries.;Review the importance of being able to check your own pulse for safety during independent exercise       Expected Outcomes Short Term: Able to explain why pulse checking is important during independent exercise;Long Term: Able to check pulse independently and accurately       Understanding of Exercise Prescription Yes       Intervention Provide education, explanation, and written materials on patient's individual exercise prescription       Expected Outcomes Short Term: Able to explain program exercise prescription;Long Term: Able to explain home exercise prescription to exercise independently          Exercise Goals Re-Evaluation :  Exercise Goals Re-Evaluation     Row Name 09/26/23 1353             Exercise Goal Re-Evaluation   Exercise Goals Review Able to understand and use rate of perceived exertion (  RPE) scale;Increase  Physical Activity;Knowledge and understanding of Target Heart Rate Range (THRR);Understanding of Exercise Prescription;Increase Strength and Stamina;Able to check pulse independently;Able to understand and use Dyspnea scale       Comments Reviewed RPE and dyspnea scale, THR and program prescription with pt today.  Pt voiced understanding and was given a copy of goals to take home.       Expected Outcomes Short: Use RPE daily to regulate intensity. Long: Follow program prescription in THR.          Discharge Exercise Prescription (Final Exercise Prescription Changes):  Exercise Prescription Changes - 09/15/23 1600       Response to Exercise   Blood Pressure (Admit) 118/70    Blood Pressure (Exercise) 128/80    Blood Pressure (Exit) 118/72    Heart Rate (Admit) 63 bpm    Heart Rate (Exercise) 95 bpm    Heart Rate (Exit) 66 bpm    Oxygen Saturation (Admit) 99 %    Oxygen Saturation (Exercise) 96 %    Oxygen Saturation (Exit) 98 %    Rating of Perceived Exertion (Exercise) 9    Perceived Dyspnea (Exercise) 0    Symptoms none    Comments results    Intensity THRR New      Progression   Average METs 2.88          Nutrition:  Target Goals: Understanding of nutrition guidelines, daily intake of sodium 1500mg , cholesterol 200mg , calories 30% from fat and 7% or less from saturated fats, daily to have 5 or more servings of fruits and vegetables.  Education: All About Nutrition: -Group instruction provided by verbal, written material, interactive activities, discussions, models, and posters to present general guidelines for heart healthy nutrition including fat, fiber, MyPlate, the role of sodium in heart healthy nutrition, utilization of the nutrition label, and utilization of this knowledge for meal planning. Follow up email sent as well. Written material given at graduation.   Biometrics:  Pre Biometrics - 09/15/23 1629       Pre Biometrics   Height 5' 10.5 (1.791 m)     Weight 191 lb 11.2 oz (87 kg)    Waist Circumference 38 inches    Hip Circumference 39 inches    Waist to Hip Ratio 0.97 %    BMI (Calculated) 27.11    Single Leg Stand 30 seconds           Nutrition Therapy Plan and Nutrition Goals:  Nutrition Therapy & Goals - 09/15/23 1630       Personal Nutrition Goals   Nutrition Goal Will meet with RD on 6/18      Intervention Plan   Intervention Prescribe, educate and counsel regarding individualized specific dietary modifications aiming towards targeted core components such as weight, hypertension, lipid management, diabetes, heart failure and other comorbidities.;Nutrition handout(s) given to patient.    Expected Outcomes Short Term Goal: Understand basic principles of dietary content, such as calories, fat, sodium, cholesterol and nutrients.;Short Term Goal: A plan has been developed with personal nutrition goals set during dietitian appointment.;Long Term Goal: Adherence to prescribed nutrition plan.          Nutrition Assessments:  MEDIFICTS Score Key: >=70 Need to make dietary changes  40-70 Heart Healthy Diet <= 40 Therapeutic Level Cholesterol Diet   Picture Your Plate Scores: <54 Unhealthy dietary pattern with much room for improvement. 41-50 Dietary pattern unlikely to meet recommendations for good health and room for improvement. 51-60 More healthful dietary pattern,  with some room for improvement.  >60 Healthy dietary pattern, although there may be some specific behaviors that could be improved.    Nutrition Goals Re-Evaluation:   Nutrition Goals Discharge (Final Nutrition Goals Re-Evaluation):   Psychosocial: Target Goals: Acknowledge presence or absence of significant depression and/or stress, maximize coping skills, provide positive support system. Participant is able to verbalize types and ability to use techniques and skills needed for reducing stress and depression.   Education: Stress, Anxiety, and  Depression - Group verbal and visual presentation to define topics covered.  Reviews how body is impacted by stress, anxiety, and depression.  Also discusses healthy ways to reduce stress and to treat/manage anxiety and depression.  Written material given at graduation.   Education: Sleep Hygiene -Provides group verbal and written instruction about how sleep can affect your health.  Define sleep hygiene, discuss sleep cycles and impact of sleep habits. Review good sleep hygiene tips.    Initial Review & Psychosocial Screening:  Initial Psych Review & Screening - 09/15/23 1407       Initial Review   Current issues with Current Anxiety/Panic      Family Dynamics   Good Support System? Yes    Comments Marks can look to his wife inlaws and kids for support. He states no mental instability but can get anxious at times.      Barriers   Psychosocial barriers to participate in program There are no identifiable barriers or psychosocial needs.;The patient should benefit from training in stress management and relaxation.      Screening Interventions   Interventions To provide support and resources with identified psychosocial needs;Encouraged to exercise;Provide feedback about the scores to participant    Expected Outcomes Short Term goal: Utilizing psychosocial counselor, staff and physician to assist with identification of specific Stressors or current issues interfering with healing process. Setting desired goal for each stressor or current issue identified.;Long Term Goal: Stressors or current issues are controlled or eliminated.;Short Term goal: Identification and review with participant of any Quality of Life or Depression concerns found by scoring the questionnaire.;Long Term goal: The participant improves quality of Life and PHQ9 Scores as seen by post scores and/or verbalization of changes          Quality of Life Scores:   Scores of 19 and below usually indicate a poorer quality of  life in these areas.  A difference of  2-3 points is a clinically meaningful difference.  A difference of 2-3 points in the total score of the Quality of Life Index has been associated with significant improvement in overall quality of life, self-image, physical symptoms, and general health in studies assessing change in quality of life.  PHQ-9: Review Flowsheet       09/15/2023 01/13/2017 12/09/2016  Depression screen PHQ 2/9  Decreased Interest 0 0 0  Down, Depressed, Hopeless 0 0 1  PHQ - 2 Score 0 0 1  Altered sleeping 3 - -  Tired, decreased energy 2 - -  Change in appetite 3 - -  Feeling bad or failure about yourself  0 - -  Trouble concentrating 0 - -  Moving slowly or fidgety/restless 0 - -  Suicidal thoughts 0 - -  PHQ-9 Score 8 - -  Difficult doing work/chores Somewhat difficult - -   Interpretation of Total Score  Total Score Depression Severity:  1-4 = Minimal depression, 5-9 = Mild depression, 10-14 = Moderate depression, 15-19 = Moderately severe depression, 20-27 = Severe depression  Psychosocial Evaluation and Intervention:  Psychosocial Evaluation - 09/15/23 1408       Psychosocial Evaluation & Interventions   Interventions Relaxation education;Encouraged to exercise with the program and follow exercise prescription;Stress management education    Comments Daiel can look to his wife inlaws and kids for support. He states no mental instability but can get anxious at times.    Expected Outcomes Short: Start [facility] to help with mood. Long: Maintain a healthy mental state    Continue Psychosocial Services  Follow up required by staff          Psychosocial Re-Evaluation:   Psychosocial Discharge (Final Psychosocial Re-Evaluation):   Vocational Rehabilitation: Provide vocational rehab assistance to qualifying candidates.   Vocational Rehab Evaluation & Intervention:   Education: Education Goals: Education classes will be provided on a variety of topics  geared toward better understanding of heart health and risk factor modification. Participant will state understanding/return demonstration of topics presented as noted by education test scores.  Learning Barriers/Preferences:   General Cardiac Education Topics:  AED/CPR: - Group verbal and written instruction with the use of models to demonstrate the basic use of the AED with the basic ABC's of resuscitation.   Anatomy and Cardiac Procedures: - Group verbal and visual presentation and models provide information about basic cardiac anatomy and function. Reviews the testing methods done to diagnose heart disease and the outcomes of the test results. Describes the treatment choices: Medical Management, Angioplasty, or Coronary Bypass Surgery for treating various heart conditions including Myocardial Infarction, Angina, Valve Disease, and Cardiac Arrhythmias.  Written material given at graduation.   Medication Safety: - Group verbal and visual instruction to review commonly prescribed medications for heart and lung disease. Reviews the medication, class of the drug, and side effects. Includes the steps to properly store meds and maintain the prescription regimen.  Written material given at graduation.   Intimacy: - Group verbal instruction through game format to discuss how heart and lung disease can affect sexual intimacy. Written material given at graduation..   Know Your Numbers and Heart Failure: - Group verbal and visual instruction to discuss disease risk factors for cardiac and pulmonary disease and treatment options.  Reviews associated critical values for Overweight/Obesity, Hypertension, Cholesterol, and Diabetes.  Discusses basics of heart failure: signs/symptoms and treatments.  Introduces Heart Failure Zone chart for action plan for heart failure.  Written material given at graduation.   Infection Prevention: - Provides verbal and written material to individual with discussion of  infection control including proper hand washing and proper equipment cleaning during exercise session. Flowsheet Row Cardiac Rehab from 09/15/2023 in [facility] Cardiac [facility]  Date 09/15/23  Educator MB  Instruction Review Code 1- Verbalizes Understanding    Falls Prevention: - Provides verbal and written material to individual with discussion of falls prevention and safety. Flowsheet Row Cardiac Rehab from 09/15/2023 in [facility] Cardiac [facility] Pulmonary Rehab  Date 09/15/23  Educator MB  Instruction Review Code 1- Verbalizes Understanding    Other: -Provides group and verbal instruction on various topics (see comments)   Knowledge Questionnaire Score:   Core Components/Risk Factors/Patient Goals at Admission:  Personal Goals and Risk Factors at Admission - 09/15/23 1404       Core Components/Risk Factors/Patient Goals on Admission    Weight Management Yes;Weight Maintenance    Intervention Weight Management: Develop a combined nutrition and exercise program designed to reach desired caloric intake, while maintaining appropriate intake of nutrient and fiber, sodium and fats, and  appropriate energy expenditure required for the weight goal.;Weight Management: Provide education and appropriate resources to help participant work on and attain dietary goals.;Weight Management/Obesity: Establish reasonable short term and long term weight goals.    Admit Weight 191 lb 11.2 oz (87 kg)    Goal Weight: Short Term 190 lb (86.2 kg)    Goal Weight: Long Term 190 lb (86.2 kg)    Expected Outcomes Short Term: Continue to assess and modify interventions until short term weight is achieved;Long Term: Adherence to nutrition and physical activity/exercise program aimed toward attainment of established weight goal;Weight Maintenance: Understanding of the daily nutrition guidelines, which includes 25-35% calories from fat, 7% or less cal from saturated fats, less than 200mg  cholesterol, less than 1.5gm of  sodium, & 5 or more servings of fruits and vegetables daily;Understanding recommendations for meals to include 15-35% energy as protein, 25-35% energy from fat, 35-60% energy from carbohydrates, less than 200mg  of dietary cholesterol, 20-35 gm of total fiber daily;Understanding of distribution of calorie intake throughout the day with the consumption of 4-5 meals/snacks    Heart Failure Yes    Intervention Provide a combined exercise and nutrition program that is supplemented with education, support and counseling about heart failure. Directed toward relieving symptoms such as shortness of breath, decreased exercise tolerance, and extremity edema.    Expected Outcomes Improve functional capacity of life;Short term: Attendance in program 2-3 days a week with increased exercise capacity. Reported lower sodium intake. Reported increased fruit and vegetable intake. Reports medication compliance.;Short term: Daily weights obtained and reported for increase. Utilizing diuretic protocols set by physician.;Long term: Adoption of self-care skills and reduction of barriers for early signs and symptoms recognition and intervention leading to self-care maintenance.    Hypertension Yes    Intervention Provide education on lifestyle modifcations including regular physical activity/exercise, weight management, moderate sodium restriction and increased consumption of fresh fruit, vegetables, and low fat dairy, alcohol moderation, and smoking cessation.;Monitor prescription use compliance.    Expected Outcomes Short Term: Continued assessment and intervention until BP is < 140/65mm HG in hypertensive participants. < 130/49mm HG in hypertensive participants with diabetes, heart failure or chronic kidney disease.;Long Term: Maintenance of blood pressure at goal levels.    Lipids Yes    Intervention Provide education and support for participant on nutrition & aerobic/resistive exercise along with prescribed medications to  achieve LDL 70mg , HDL >40mg .    Expected Outcomes Short Term: Participant states understanding of desired cholesterol values and is compliant with medications prescribed. Participant is following exercise prescription and nutrition guidelines.;Long Term: Cholesterol controlled with medications as prescribed, with individualized exercise RX and with personalized nutrition plan. Value goals: LDL < 70mg , HDL > 40 mg.          Education:Diabetes - Individual verbal and written instruction to review signs/symptoms of diabetes, desired ranges of glucose level fasting, after meals and with exercise. Acknowledge that pre and post exercise glucose checks will be done for 3 sessions at entry of program.   Core Components/Risk Factors/Patient Goals Review:    Core Components/Risk Factors/Patient Goals at Discharge (Final Review):    ITP Comments:  ITP Comments     Row Name 09/15/23 1624 09/21/23 0930 09/26/23 1352 10/06/23 1135     ITP Comments Completed and gym orientation for cardiac rehab. Initial ITP created and sent for review to Dr. Firman Hughes, Medical Director. 30 Day review completed. Medical Director ITP review done, changes made as directed, and signed approval by Medical Director.  new to program First full day of exercise!  Patient was oriented to gym and equipment including functions, settings, policies, and procedures.  Patient's individual exercise prescription and treatment plan were reviewed.  All starting workloads were established based on the results of the 6 minute walk test done at initial orientation visit.  The plan for exercise progression was also introduced and progression will be customized based on patient's performance and goals. discharged per patient request   cannot continue due to work schedule       Comments: Discharged per pat request

## 2023-10-10 ENCOUNTER — Ambulatory Visit

## 2023-10-10 ENCOUNTER — Encounter

## 2023-10-12 ENCOUNTER — Encounter

## 2023-10-12 ENCOUNTER — Ambulatory Visit

## 2023-10-13 ENCOUNTER — Encounter

## 2023-10-17 ENCOUNTER — Ambulatory Visit

## 2023-10-17 ENCOUNTER — Encounter

## 2023-10-19 ENCOUNTER — Ambulatory Visit

## 2023-10-19 ENCOUNTER — Encounter: Attending: Internal Medicine

## 2023-10-19 ENCOUNTER — Encounter

## 2023-10-20 ENCOUNTER — Encounter

## 2023-10-24 ENCOUNTER — Encounter

## 2023-10-24 ENCOUNTER — Ambulatory Visit

## 2023-10-26 ENCOUNTER — Ambulatory Visit

## 2023-10-26 ENCOUNTER — Encounter

## 2023-10-27 ENCOUNTER — Encounter

## 2023-10-30 NOTE — Progress Notes (Unsigned)
 Cardiology Office Note  Date:  10/31/2023   ID:  Jerry Howe, DOB 1955-11-27, MRN [id number]  PCP:  Sampson Ethridge LABOR, MD   Chief Complaint  Patient presents with   3 month follow up     Patient would like to discuss some new medications that was prescribed for anxiety. Patient also has some questions regarding Torsemide  and cramping.     HPI:  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 ICD interogation showed inappropriate ICD firing for AF. Started on amio   A-fib ablation February 2024 TEE at that time ejection fraction 20 to 25% Orders for repeat echocardiogram not completed  On ICD downloads: 7 total NSVT events, V-rates 188-226 bpm (on HF remote)  1 SVT event , longest x 1 min, V-rates 194-214 bpm.  R waves similar to intrinsic with brief salvos of possible ventricular driven tachy (on HF remote).  1 AF event x 10 min (on HF remote).   Medication review Amiodarone : not taking Jardiance : not taking Eliquis : Taking Entresto  49/51: Taking Metoprolol : Taking Spironolactone : Taking Torsemide : taking on Friday and PRN  Monitoring weight and blood pressure at home For low blood pressure sometimes cuts back on his medication  Reports he is active, works 3 days cleaning classrooms at [employer], takes lots of breaks  EKG personally reviewed by myself on todays  visit EKG Interpretation Date/Time:  Monday October 31 2023 09:18:35 EDT Ventricular Rate:  85 PR Interval:  184 QRS Duration:  124 QT Interval:  392 QTC Calculation: 466 R Axis:   16  Text Interpretation: Sinus rhythm with occasional Premature ventricular complexes and Premature atrial complexes Possible Left atrial enlargement Left ventricular hypertrophy with QRS widening and repolarization abnormality ( Cornell product ) When compared with ECG of 20-Sep-2023 15:53, Premature atrial complexes are now Present Confirmed by Perla Lye [phone]) on 10/31/2023 9:22:50 AM    Prior cardiac history reviewed Sx of SOB in 2016,  2D echocardiogram on 07/11/2018 revealed severely reduced left ventricular systolic function with LVEF 25 to 30% severe left ventricular enlargement.  Echo 06/2018 EF 25 to 30%, moderate MR SEVERE LV ENLARGEMENT    PMH:   has a past medical history of Anxiety, Arrhythmia, CHF (congestive heart failure) (HCC), Hypertension, Kidney stones, and Shortness of breath dyspnea.  PSH:    Past Surgical History:  Procedure Laterality Date   ATRIAL FIBRILLATION ABLATION N/A 05/24/2022   Procedure: ATRIAL FIBRILLATION ABLATION;  Surgeon: Cindie Ole DASEN, MD;  Location: [facility] INVASIVE CV LAB;  Service: Cardiovascular;  Laterality: N/A;   CARDIAC CATHETERIZATION Right 11/20/2014   Procedure: Left Heart Cath and Coronary Angiography;  Surgeon: Marsa Dooms, MD;  Location: [facility] INVASIVE CV LAB;  Service: Cardiovascular;  Laterality: Right;   IMPLANTABLE CARDIOVERTER DEFIBRILLATOR (ICD) GENERATOR CHANGE Left 08/15/2015   Procedure: ICD IMPLANT, single chamber;  Surgeon: Franky LITTIE Ned, MD;  Location: [facility] ORS;  Service: Cardiovascular;  Laterality: Left;   KNEE SURGERY  RIGHT/LEFT HEART CATH AND CORONARY ANGIOGRAPHY N/A 02/10/2022   Procedure: RIGHT/LEFT HEART CATH AND CORONARY ANGIOGRAPHY;  Surgeon: Mady Bruckner, MD;  Location: [facility] INVASIVE CV LAB;  Service: Cardiovascular;   Laterality: N/A;   TEE WITHOUT CARDIOVERSION N/A 05/24/2022   Procedure: TRANSESOPHAGEAL ECHOCARDIOGRAM;  Surgeon: Cindie Ole DASEN, MD;  Location: [facility] INVASIVE CV LAB;  Service: Cardiovascular;  Laterality: N/A;    Current Outpatient Medications  Medication Sig Dispense Refill   albuterol  (PROVENTIL  HFA;VENTOLIN  HFA) 108 (90 Base) MCG/ACT inhaler Inhale 1 puff into the lungs every 6 (six) hours as needed for wheezing or shortness of breath.     apixaban  (ELIQUIS ) 5 MG TABS tablet Take 1 tablet (5 mg total) by mouth 2 (two) times daily. 180 tablet 3   ascorbic acid  (VITAMIN C ) 1000 MG tablet 1 tablet Orally Once a day for health maintenance     Cholecalciferol 125 MCG (5000 UT) CHEW 1 capsule.     cyanocobalamin  2000 MCG tablet Take 2,000 mcg by mouth daily.     fluticasone  (FLONASE ) 50 MCG/ACT nasal spray Place 1 spray into both nostrils in the morning and at bedtime.     levocetirizine (XYZAL) 5 MG tablet 1 tablet in the evening Orally Once a day for 100 days for allergy symptoms     Magnesium  200 MG TABS Take 200 mg by mouth daily.     Ascorbic Acid  (VITAMIN C ) 1000 MG tablet Take 1,000 mg by mouth daily. (Patient not taking: Reported on 10/31/2023)     empagliflozin  (JARDIANCE ) 10 MG TABS tablet Take 1 tablet (10 mg total) by mouth daily. (Patient not taking: Reported on 10/31/2023) 30 tablet 2   EPINEPHrine (ADRENALIN) 30 MG/30ML SOLN injection as directed Injection As needed for severe allergic reaction (Patient not taking: Reported on 10/31/2023)     EPINEPHrine 0.3 mg/0.3 mL IJ SOAJ injection Inject 0.3 mg IM single dose for 90 days may repeat every ~5 to 15 minutes if needed for allergic reaction (Patient not taking: Reported on 10/31/2023)     fluticasone  (FLONASE ) 50 MCG/ACT nasal spray 1 spray in each nostril Nasally Twice a day for 30 days for allergy symptoms (Patient not taking: Reported on 10/31/2023)     glucose blood test strip Use as directed up to 3 times a day to check blood  sugar for 100 days (Patient not taking: Reported on 10/31/2023)     menthol -cetylpyridinium (CEPACOL) 3 MG lozenge Take 1 lozenge (3 mg total) by mouth as needed for sore throat. 30 tablet 0   metoprolol  succinate (TOPROL -XL) 25 MG 24 hr tablet Take 25 mg by mouth daily.     Multiple Vitamins-Minerals (SUPER THERA VITE M PO) Take 1 tablet by mouth daily.     Naphazoline-Glycerin (REDNESS RELIEF OP) Place 1 drop into both eyes 2 (two) times daily.     omeprazole (PRILOSEC) 20 MG capsule Take 20 mg by mouth daily.     omeprazole (PRILOSEC) 20 MG capsule 1 capsule. (Patient not taking: Reported on 09/15/2023)     promethazine-dextromethorphan (PROMETHAZINE-DM) 6.25-15 MG/5ML syrup Take 5 mLs by mouth 4 (four) times daily as needed for cough.     promethazine-dextromethorphan (PROMETHAZINE-DM) 6.25-15 MG/5ML syrup 5 mL Oral every 6 hours for 6 days As needed cough     pyridoxine  (B-6) 500 MG tablet Take 500 mg by mouth daily.     rosuvastatin  (CRESTOR ) 10 MG tablet 1 tablet Orally Once a day for 100 days for high cholesterol/heart disease  sacubitril -valsartan  (ENTRESTO ) 49-51 MG Take 1 tablet by mouth 2 (two) times daily. 180 tablet 3   spironolactone  (ALDACTONE ) 25 MG tablet Take 1 tablet (25 mg total) by mouth daily. (Patient not taking: Reported on 09/15/2023) 90 tablet 3   torsemide  (DEMADEX ) 20 MG tablet Take 1 tablet (20 mg total) by mouth daily as needed. 90 tablet 3   Vitamin E 180 MG (400 UNIT) CAPS 1 capsule. (Patient not taking: Reported on 09/15/2023)     Zinc Gluconate 100 MG TABS 1 tablet Orally Once a day for health maintenance (Patient not taking: Reported on 09/15/2023)     No current facility-administered medications for this visit.    Allergies:   Shellfish allergy, Shellfish-derived products, and Rosuvastatin    Social History:  The patient  reports that he has never smoked. He has never used smokeless tobacco. He reports that he does not currently use drugs. He reports that he  does not drink alcohol.   Family History:   family history includes Diabetes in his brother; Heart disease in his father; Hyperlipidemia in his brother and mother; Hypertension in his father.    Review of Systems: Review of Systems  Constitutional: Negative.   HENT: Negative.    Respiratory:  Positive for shortness of breath.   Cardiovascular: Negative.   Gastrointestinal: Negative.   Musculoskeletal: Negative.   Neurological: Negative.   Psychiatric/Behavioral: Negative.    All other systems reviewed and are negative.   PHYSICAL EXAM: VS:  BP 130/60 (BP Location: Left Arm, Patient Position: Sitting, Cuff Size: Normal)   Pulse 85   Ht 5' 9 (1.753 m)   Wt 199 lb 2 oz (90.3 kg)   SpO2 98%   BMI 29.41 kg/m  , BMI Body mass index is 29.41 kg/m. Constitutional:  oriented to person, place, and time. No distress.  HENT:  Head: Grossly normal Eyes:  no discharge. No scleral icterus.  Neck: No JVD, no carotid bruits  Cardiovascular: Regular rate and rhythm, no murmurs appreciated Pulmonary/Chest: Clear to auscultation bilaterally, no wheezes or rales Abdominal: Soft.  no distension.  no tenderness.  Musculoskeletal: Normal range of motion Neurological:  normal muscle tone. Coordination normal. No atrophy Skin: Skin warm and dry Psychiatric: normal affect, pleasant   Recent Labs: 05/27/2023: B Natriuretic Peptide >4,500.0; Magnesium  1.9 05/30/2023: BUN 44; Creatinine, Ser 1.40; Hemoglobin 13.4; Platelets 161; Potassium 4.0; Sodium 130 06/13/2023: ALT 21; TSH 0.603    Lipid Panel Lab Results  Component Value Date   CHOL 184 11/19/2014   HDL 40 (L) 11/19/2014   LDLCALC 106 (H) 11/19/2014   TRIG 188 (H) 11/19/2014    Wt Readings from Last 3 Encounters:  10/31/23 199 lb 2 oz (90.3 kg)  09/20/23 197 lb (89.4 kg)  09/15/23 191 lb 11.2 oz (87 kg)     ASSESSMENT AND PLAN:  Problem List Items Addressed This Visit       Cardiology Problems   Coronary artery disease due to  lipid rich plaque   Relevant Orders   EKG 12-Lead (Completed)   HTN (hypertension)   V tach (HCC)   Other Visit Diagnoses       Chronic systolic heart failure (HCC)    -  Primary   Relevant Orders   EKG 12-Lead (Completed)     ICD (implantable cardioverter-defibrillator) in place       Relevant Orders   EKG 12-Lead (Completed)     Paroxysmal atrial fibrillation (HCC)       Relevant Orders  EKG 12-Lead (Completed)     Stage 3 chronic kidney disease, unspecified whether stage 3a or 3b CKD (HCC)         Chest tightness         PVC's (premature ventricular contractions)       Relevant Orders   EKG 12-Lead (Completed)       Nonischemic cardiomyopathy  ICD in place Nonischemic by catheterization in 2016 -Long discussion concerning his medications Recommend he continue Entresto  49/51 mg twice daily, spironolactone  25 daily metoprololsuccinate 25 daily Restart Jardiance  10 daily Torsemide  on Fridays and as needed Repeat echo on the schedule  Chronic systolic CHF On spironolactone  25 daily and Entresto   Paroxysmal atrial fibrillation Recommend compliance with Eliquis , metoprolol  Amiodarone  200 daily restarted (was not taking)   Signed, Velinda Lunger, M.D., Ph.D. [facility] [place], [place] [phone]

## 2023-10-31 ENCOUNTER — Ambulatory Visit

## 2023-10-31 ENCOUNTER — Encounter: Payer: Self-pay | Admitting: Cardiovascular Disease

## 2023-10-31 ENCOUNTER — Ambulatory Visit: Attending: Cardiovascular Disease | Admitting: Cardiovascular Disease

## 2023-10-31 ENCOUNTER — Encounter

## 2023-10-31 VITALS — BP 130/60 | HR 85 | Ht 69.0 in | Wt 199.1 lb

## 2023-10-31 DIAGNOSIS — N183 Chronic kidney disease, stage 3 unspecified: Secondary | ICD-10-CM

## 2023-10-31 DIAGNOSIS — I5022 Chronic systolic (congestive) heart failure: Secondary | ICD-10-CM

## 2023-10-31 DIAGNOSIS — Z95810 Presence of automatic (implantable) cardiac defibrillator: Secondary | ICD-10-CM | POA: Diagnosis not present

## 2023-10-31 DIAGNOSIS — R0789 Other chest pain: Secondary | ICD-10-CM

## 2023-10-31 DIAGNOSIS — I2510 Atherosclerotic heart disease of native coronary artery without angina pectoris: Secondary | ICD-10-CM | POA: Diagnosis not present

## 2023-10-31 DIAGNOSIS — I472 Ventricular tachycardia, unspecified: Secondary | ICD-10-CM | POA: Diagnosis not present

## 2023-10-31 DIAGNOSIS — I10 Essential (primary) hypertension: Secondary | ICD-10-CM

## 2023-10-31 DIAGNOSIS — Z79899 Other long term (current) drug therapy: Secondary | ICD-10-CM

## 2023-10-31 DIAGNOSIS — I480 Paroxysmal atrial fibrillation: Secondary | ICD-10-CM

## 2023-10-31 DIAGNOSIS — I2583 Coronary atherosclerosis due to lipid rich plaque: Secondary | ICD-10-CM

## 2023-10-31 DIAGNOSIS — I493 Ventricular premature depolarization: Secondary | ICD-10-CM

## 2023-10-31 MED ORDER — TORSEMIDE 20 MG PO TABS: 20.0000 mg | ORAL_TABLET | Freq: Every day | ORAL | 3 refills | Status: DC | PRN

## 2023-10-31 MED ORDER — APIXABAN 5 MG PO TABS: 5.0000 mg | ORAL_TABLET | Freq: Two times a day (BID) | ORAL | 3 refills | Status: AC

## 2023-10-31 MED ORDER — METOPROLOL SUCCINATE ER 25 MG PO TB24: 25.0000 mg | ORAL_TABLET | Freq: Every day | ORAL | 3 refills | Status: DC

## 2023-10-31 MED ORDER — ROSUVASTATIN CALCIUM 10 MG PO TABS: 10.0000 mg | ORAL_TABLET | Freq: Every day | ORAL | 3 refills | Status: AC

## 2023-10-31 MED ORDER — SPIRONOLACTONE 25 MG PO TABS: 25.0000 mg | ORAL_TABLET | Freq: Every day | ORAL | 3 refills | Status: DC

## 2023-10-31 MED ORDER — SACUBITRIL-VALSARTAN 49-51 MG PO TABS: 1.0000 | ORAL_TABLET | Freq: Two times a day (BID) | ORAL | 3 refills | Status: DC

## 2023-10-31 MED ORDER — AMIODARONE HCL 200 MG PO TABS: 200.0000 mg | ORAL_TABLET | Freq: Every day | ORAL | 3 refills | Status: DC

## 2023-10-31 MED ORDER — EMPAGLIFLOZIN 10 MG PO TABS: 10.0000 mg | ORAL_TABLET | Freq: Every day | ORAL | 3 refills | Status: AC

## 2023-10-31 NOTE — Patient Instructions (Addendum)
 Medication Instructions:   Restart amiodarone  200 mg daily Restart jardiance  10 mg daily  If you need a refill on your cardiac medications before your next appointment, please call your pharmacy.   Lab work: No new labs needed  Testing/Procedures: No new testing needed  Follow-Up: At [facility], you and your health needs are our priority.  As part of our continuing mission to provide you with exceptional heart care, we have created designated Provider Care Teams.  These Care Teams include your primary Cardiologist (physician) and Advanced Practice Providers (APPs -  Physician Assistants and Nurse Practitioners) who all work together to provide you with the care you need, when you need it.  You will need a follow up appointment in 6 months  Providers on your designated Care Team:   Lonni Meager, NP Bernardino Bring, PA-C Cadence [place], [place]  COVID-19 Vaccine Information can be found at: [email or web address] For questions related to vaccine distribution or appointments, please email [email or web address] or call [phone].

## 2023-11-02 ENCOUNTER — Encounter

## 2023-11-02 ENCOUNTER — Ambulatory Visit

## 2023-11-03 ENCOUNTER — Encounter

## 2023-11-07 ENCOUNTER — Encounter

## 2023-11-07 ENCOUNTER — Ambulatory Visit

## 2023-11-08 ENCOUNTER — Telehealth: Payer: Self-pay

## 2023-11-08 ENCOUNTER — Other Ambulatory Visit (HOSPITAL_COMMUNITY): Payer: Self-pay

## 2023-11-08 ENCOUNTER — Encounter: Payer: Self-pay | Admitting: Internal Medicine

## 2023-11-08 NOTE — Telephone Encounter (Signed)
 Advanced Heart Failure Patient Advocate Encounter  Review of patient chart, as [employer] grant coverage will end on 07/28. Test billing returns a copay of $0 for 90 day supply of Entresto . Lorrene renewal not needed at this time. Will be available for future assistance needs for patient.  Rachel DEL, CPhT Rx Patient Advocate Phone: [phone]

## 2023-11-09 ENCOUNTER — Encounter

## 2023-11-09 ENCOUNTER — Ambulatory Visit

## 2023-11-10 ENCOUNTER — Encounter

## 2023-11-10 ENCOUNTER — Telehealth: Payer: Self-pay | Admitting: Internal Medicine

## 2023-11-10 NOTE — Telephone Encounter (Signed)
 Patient left vm regarding infusion, please return call

## 2023-11-10 NOTE — Progress Notes (Signed)
 Remote ICD transmission.

## 2023-11-11 ENCOUNTER — Telehealth: Payer: Self-pay

## 2023-11-11 DIAGNOSIS — I5022 Chronic systolic (congestive) heart failure: Secondary | ICD-10-CM

## 2023-11-11 NOTE — Telephone Encounter (Signed)
 Callers name: galo sayed  Issue/reason for call: States that his pcp cannot refer pt to get iron infusion and suggest that he get Dr. Bensimhon to set up an iron infusion if possible?

## 2023-11-14 ENCOUNTER — Other Ambulatory Visit (HOSPITAL_COMMUNITY): Payer: Self-pay | Admitting: Internal Medicine

## 2023-11-14 ENCOUNTER — Encounter

## 2023-11-14 ENCOUNTER — Other Ambulatory Visit: Payer: Self-pay

## 2023-11-14 ENCOUNTER — Telehealth (HOSPITAL_COMMUNITY): Payer: Self-pay | Admitting: Internal Medicine

## 2023-11-14 ENCOUNTER — Ambulatory Visit

## 2023-11-14 DIAGNOSIS — I5022 Chronic systolic (congestive) heart failure: Secondary | ICD-10-CM

## 2023-11-14 DIAGNOSIS — I5043 Acute on chronic combined systolic (congestive) and diastolic (congestive) heart failure: Secondary | ICD-10-CM

## 2023-11-14 NOTE — Telephone Encounter (Signed)
 Referral for IV iron therapy placed and to be coordinated by the outpatient infusion team.

## 2023-11-14 NOTE — Telephone Encounter (Signed)
 Patient referred to infusion pharmacy team for ambulatory infusion of IV iron.  Insurance - [employer] Site of care - Site of care: [facility] INF Dx code - I50.22  IV Iron Therapy - Venofer 200 mg IV x 5  Infusion appointments - Scheduling team will schedule patient as soon as possible.   Sharlotte Baka D. Kayan Blissett, PharmD

## 2023-11-14 NOTE — Telephone Encounter (Unsigned)
 Called and spoke with pt regarding picking up sleep study on Thursday, July 31st. Pt agreed to come get it. Will go over instruction in office once patient is here. Also notified pt that Dr. Cherrie wants his echo completed sooner, however Taneisha spoke to [facility] and they do not have any sooner openings but have placed him on a wait list for any cancellations.

## 2023-11-16 ENCOUNTER — Encounter

## 2023-11-16 ENCOUNTER — Ambulatory Visit

## 2023-11-17 ENCOUNTER — Encounter

## 2023-11-19 ENCOUNTER — Inpatient Hospital Stay
Admission: EM | Admit: 2023-11-19 | Discharge: 2023-11-23 | DRG: 871 | Disposition: A | Discharge status: Home / Self Care | Attending: Internal Medicine | Admitting: Internal Medicine

## 2023-11-19 ENCOUNTER — Emergency Department

## 2023-11-19 ENCOUNTER — Other Ambulatory Visit: Payer: Self-pay

## 2023-11-19 DIAGNOSIS — Z7901 Long term (current) use of anticoagulants: Secondary | ICD-10-CM | POA: Diagnosis not present

## 2023-11-19 DIAGNOSIS — E871 Hypo-osmolality and hyponatremia: Secondary | ICD-10-CM | POA: Diagnosis present

## 2023-11-19 DIAGNOSIS — I480 Paroxysmal atrial fibrillation: Secondary | ICD-10-CM | POA: Diagnosis present

## 2023-11-19 DIAGNOSIS — I9589 Other hypotension: Secondary | ICD-10-CM | POA: Diagnosis not present

## 2023-11-19 DIAGNOSIS — R946 Abnormal results of thyroid function studies: Secondary | ICD-10-CM | POA: Insufficient documentation

## 2023-11-19 DIAGNOSIS — I5082 Biventricular heart failure: Secondary | ICD-10-CM | POA: Diagnosis present

## 2023-11-19 DIAGNOSIS — A419 Sepsis, unspecified organism: Secondary | ICD-10-CM | POA: Diagnosis present

## 2023-11-19 DIAGNOSIS — Z8249 Family history of ischemic heart disease and other diseases of the circulatory system: Secondary | ICD-10-CM

## 2023-11-19 DIAGNOSIS — Z833 Family history of diabetes mellitus: Secondary | ICD-10-CM

## 2023-11-19 DIAGNOSIS — I130 Hypertensive heart and chronic kidney disease with heart failure and stage 1 through stage 4 chronic kidney disease, or unspecified chronic kidney disease: Secondary | ICD-10-CM | POA: Diagnosis present

## 2023-11-19 DIAGNOSIS — Z79899 Other long term (current) drug therapy: Secondary | ICD-10-CM

## 2023-11-19 DIAGNOSIS — N1831 Chronic kidney disease, stage 3a: Secondary | ICD-10-CM | POA: Diagnosis present

## 2023-11-19 DIAGNOSIS — Z91013 Allergy to seafood: Secondary | ICD-10-CM

## 2023-11-19 DIAGNOSIS — I2510 Atherosclerotic heart disease of native coronary artery without angina pectoris: Secondary | ICD-10-CM | POA: Diagnosis present

## 2023-11-19 DIAGNOSIS — Z8349 Family history of other endocrine, nutritional and metabolic diseases: Secondary | ICD-10-CM

## 2023-11-19 DIAGNOSIS — E673 Hypervitaminosis D: Secondary | ICD-10-CM | POA: Insufficient documentation

## 2023-11-19 DIAGNOSIS — N1832 Chronic kidney disease, stage 3b: Secondary | ICD-10-CM | POA: Diagnosis not present

## 2023-11-19 DIAGNOSIS — Z992 Dependence on renal dialysis: Secondary | ICD-10-CM

## 2023-11-19 DIAGNOSIS — I5022 Chronic systolic (congestive) heart failure: Secondary | ICD-10-CM | POA: Diagnosis present

## 2023-11-19 DIAGNOSIS — Z1152 Encounter for screening for COVID-19: Secondary | ICD-10-CM

## 2023-11-19 DIAGNOSIS — Z95810 Presence of automatic (implantable) cardiac defibrillator: Secondary | ICD-10-CM

## 2023-11-19 DIAGNOSIS — I493 Ventricular premature depolarization: Secondary | ICD-10-CM | POA: Diagnosis present

## 2023-11-19 DIAGNOSIS — J181 Lobar pneumonia, unspecified organism: Secondary | ICD-10-CM | POA: Diagnosis present

## 2023-11-19 DIAGNOSIS — K219 Gastro-esophageal reflux disease without esophagitis: Secondary | ICD-10-CM | POA: Diagnosis present

## 2023-11-19 DIAGNOSIS — I420 Dilated cardiomyopathy: Secondary | ICD-10-CM | POA: Diagnosis present

## 2023-11-19 DIAGNOSIS — N400 Enlarged prostate without lower urinary tract symptoms: Secondary | ICD-10-CM | POA: Diagnosis present

## 2023-11-19 DIAGNOSIS — J189 Pneumonia, unspecified organism: Secondary | ICD-10-CM

## 2023-11-19 DIAGNOSIS — E785 Hyperlipidemia, unspecified: Secondary | ICD-10-CM | POA: Diagnosis present

## 2023-11-19 DIAGNOSIS — E875 Hyperkalemia: Secondary | ICD-10-CM | POA: Diagnosis present

## 2023-11-19 DIAGNOSIS — I959 Hypotension, unspecified: Secondary | ICD-10-CM | POA: Diagnosis present

## 2023-11-19 DIAGNOSIS — E7800 Pure hypercholesterolemia, unspecified: Secondary | ICD-10-CM | POA: Insufficient documentation

## 2023-11-19 DIAGNOSIS — I5043 Acute on chronic combined systolic (congestive) and diastolic (congestive) heart failure: Secondary | ICD-10-CM

## 2023-11-19 DIAGNOSIS — T61781A Other shellfish poisoning, accidental (unintentional), initial encounter: Secondary | ICD-10-CM | POA: Insufficient documentation

## 2023-11-19 DIAGNOSIS — I471 Supraventricular tachycardia, unspecified: Secondary | ICD-10-CM | POA: Diagnosis present

## 2023-11-19 DIAGNOSIS — I5021 Acute systolic (congestive) heart failure: Secondary | ICD-10-CM | POA: Diagnosis not present

## 2023-11-19 DIAGNOSIS — Z7984 Long term (current) use of oral hypoglycemic drugs: Secondary | ICD-10-CM

## 2023-11-19 DIAGNOSIS — I428 Other cardiomyopathies: Secondary | ICD-10-CM | POA: Diagnosis not present

## 2023-11-19 DIAGNOSIS — F410 Panic disorder [episodic paroxysmal anxiety] without agoraphobia: Secondary | ICD-10-CM | POA: Insufficient documentation

## 2023-11-19 DIAGNOSIS — I5023 Acute on chronic systolic (congestive) heart failure: Secondary | ICD-10-CM | POA: Diagnosis present

## 2023-11-19 DIAGNOSIS — I10 Essential (primary) hypertension: Secondary | ICD-10-CM | POA: Diagnosis present

## 2023-11-19 DIAGNOSIS — I509 Heart failure, unspecified: Secondary | ICD-10-CM

## 2023-11-19 DIAGNOSIS — R059 Cough, unspecified: Secondary | ICD-10-CM | POA: Diagnosis present

## 2023-11-19 LAB — HEPATIC FUNCTION PANEL
ALT: 14 U/L (ref 0–44)
AST: 35 U/L (ref 15–41)
Albumin: 2.9 g/dL — ABNORMAL LOW (ref 3.5–5.0)
Alkaline Phosphatase: 55 U/L (ref 38–126)
Bilirubin, Direct: 1.1 mg/dL — ABNORMAL HIGH (ref 0.0–0.2)
Indirect Bilirubin: 1 mg/dL — ABNORMAL HIGH (ref 0.3–0.9)
Total Bilirubin: 2.1 mg/dL — ABNORMAL HIGH (ref 0.0–1.2)
Total Protein: 6.6 g/dL (ref 6.5–8.1)

## 2023-11-19 LAB — PROCALCITONIN: Procalcitonin: 20.26 ng/mL

## 2023-11-19 LAB — CBC
HCT: 42.9 % (ref 39.0–52.0)
Hemoglobin: 13.9 g/dL (ref 13.0–17.0)
MCH: 27.6 pg (ref 26.0–34.0)
MCHC: 32.4 g/dL (ref 30.0–36.0)
MCV: 85.3 fL (ref 80.0–100.0)
Platelets: 162 K/uL (ref 150–400)
RBC: 5.03 MIL/uL (ref 4.22–5.81)
RDW: 14.6 % (ref 11.5–15.5)
WBC: 21.7 K/uL — ABNORMAL HIGH (ref 4.0–10.5)
nRBC: 0 % (ref 0.0–0.2)

## 2023-11-19 LAB — BASIC METABOLIC PANEL WITH GFR
Anion gap: 14 (ref 5–15)
BUN: 31 mg/dL — ABNORMAL HIGH (ref 8–23)
CO2: 22 mmol/L (ref 22–32)
Calcium: 9.3 mg/dL (ref 8.9–10.3)
Chloride: 93 mmol/L — ABNORMAL LOW (ref 98–111)
Creatinine, Ser: 1.68 mg/dL — ABNORMAL HIGH (ref 0.61–1.24)
GFR, Estimated: 44 mL/min — ABNORMAL LOW (ref >60)
Glucose, Bld: 168 mg/dL — ABNORMAL HIGH (ref 70–99)
Potassium: 5.2 mmol/L — ABNORMAL HIGH (ref 3.5–5.1)
Sodium: 129 mmol/L — ABNORMAL LOW (ref 135–145)

## 2023-11-19 LAB — MRSA NEXT GEN BY PCR, NASAL: MRSA by PCR Next Gen: NOT DETECTED

## 2023-11-19 LAB — RESP PANEL BY RT-PCR (RSV, FLU A&B, COVID)  RVPGX2
Influenza A by PCR: NEGATIVE
Influenza B by PCR: NEGATIVE
Resp Syncytial Virus by PCR: NEGATIVE
SARS Coronavirus 2 by RT PCR: NEGATIVE

## 2023-11-19 LAB — LACTIC ACID, PLASMA: Lactic Acid, Venous: 1.9 mmol/L (ref 0.5–1.9)

## 2023-11-19 LAB — BRAIN NATRIURETIC PEPTIDE: B Natriuretic Peptide: >4500 pg/mL — ABNORMAL HIGH (ref 0.0–100.0)

## 2023-11-19 MED ORDER — DOXYCYCLINE HYCLATE 100 MG PO TABS
100.0000 mg | ORAL_TABLET | Freq: Two times a day (BID) | ORAL | Status: DC
  Administered 2023-11-19 – 2023-11-23 (×8): 100 mg via ORAL
  Filled 2023-11-19 (×8): qty 1

## 2023-11-19 MED ORDER — MIDODRINE HCL 5 MG PO TABS
10.0000 mg | ORAL_TABLET | Freq: Once | ORAL | Status: AC
  Administered 2023-11-19: 10 mg via ORAL
  Filled 2023-11-19: qty 2

## 2023-11-19 MED ORDER — VITAMIN B-6 100 MG PO TABS
500.0000 mg | ORAL_TABLET | Freq: Every day | ORAL | Status: DC
  Administered 2023-11-20 – 2023-11-23 (×4): 500 mg via ORAL
  Filled 2023-11-19 (×4): qty 5

## 2023-11-19 MED ORDER — APIXABAN 5 MG PO TABS
5.0000 mg | ORAL_TABLET | Freq: Two times a day (BID) | ORAL | Status: DC
  Administered 2023-11-19 – 2023-11-23 (×8): 5 mg via ORAL
  Filled 2023-11-19 (×9): qty 1

## 2023-11-19 MED ORDER — CHLORHEXIDINE GLUCONATE CLOTH 2 % EX PADS
6.0000 | MEDICATED_PAD | Freq: Every day | CUTANEOUS | Status: DC
  Administered 2023-11-19 – 2023-11-20 (×2): 6 via TOPICAL
  Filled 2023-11-19: qty 6

## 2023-11-19 MED ORDER — SODIUM CHLORIDE 0.9 % IV BOLUS: 500.0000 mL | Freq: Once | INTRAVENOUS | Status: DC

## 2023-11-19 MED ORDER — SODIUM CHLORIDE 0.9 % IV SOLN
2.0000 g | Freq: Once | INTRAVENOUS | Status: AC
  Administered 2023-11-19: 2 g via INTRAVENOUS
  Filled 2023-11-19: qty 20

## 2023-11-19 MED ORDER — EMPAGLIFLOZIN 10 MG PO TABS
10.0000 mg | ORAL_TABLET | Freq: Every day | ORAL | Status: DC
  Administered 2023-11-20 – 2023-11-23 (×4): 10 mg via ORAL
  Filled 2023-11-19 (×4): qty 1

## 2023-11-19 MED ORDER — KETOROLAC TROMETHAMINE 15 MG/ML IJ SOLN
15.0000 mg | Freq: Once | INTRAMUSCULAR | Status: AC
  Administered 2023-11-19: 15 mg via INTRAVENOUS
  Filled 2023-11-19: qty 1

## 2023-11-19 MED ORDER — ACETAMINOPHEN 325 MG PO TABS
650.0000 mg | ORAL_TABLET | Freq: Once | ORAL | Status: AC | PRN
  Administered 2023-11-19: 650 mg via ORAL
  Filled 2023-11-19: qty 2

## 2023-11-19 MED ORDER — IRON SUCROSE 200 MG IVPB - SIMPLE MED
200.0000 mg | Status: DC
  Filled 2023-11-19: qty 110

## 2023-11-19 MED ORDER — ONDANSETRON HCL 4 MG/2ML IJ SOLN: 4.0000 mg | Freq: Four times a day (QID) | INTRAMUSCULAR | Status: DC | PRN

## 2023-11-19 MED ORDER — AMIODARONE HCL 200 MG PO TABS
200.0000 mg | ORAL_TABLET | Freq: Every day | ORAL | Status: DC
  Administered 2023-11-20 – 2023-11-21 (×2): 200 mg via ORAL
  Filled 2023-11-19 (×2): qty 1

## 2023-11-19 MED ORDER — SODIUM ZIRCONIUM CYCLOSILICATE 10 G PO PACK
10.0000 g | PACK | Freq: Once | ORAL | Status: AC
  Administered 2023-11-19: 10 g via ORAL
  Filled 2023-11-19: qty 1

## 2023-11-19 MED ORDER — PANTOPRAZOLE SODIUM 40 MG PO TBEC
40.0000 mg | DELAYED_RELEASE_TABLET | Freq: Every day | ORAL | Status: DC
  Administered 2023-11-20 – 2023-11-23 (×4): 40 mg via ORAL
  Filled 2023-11-19 (×4): qty 1

## 2023-11-19 MED ORDER — GUAIFENESIN-DM 100-10 MG/5ML PO SYRP
10.0000 mL | ORAL_SOLUTION | ORAL | Status: DC | PRN
  Administered 2023-11-19 – 2023-11-20 (×2): 10 mL via ORAL
  Filled 2023-11-19 (×2): qty 10

## 2023-11-19 MED ORDER — SODIUM CHLORIDE 0.9 % IV SOLN
1.0000 g | INTRAVENOUS | Status: DC
  Filled 2023-11-19: qty 10

## 2023-11-19 MED ORDER — ACETAMINOPHEN 325 MG PO TABS
650.0000 mg | ORAL_TABLET | Freq: Four times a day (QID) | ORAL | Status: DC | PRN
  Administered 2023-11-21 – 2023-11-22 (×2): 650 mg via ORAL
  Filled 2023-11-19 (×2): qty 2

## 2023-11-19 MED ORDER — ROSUVASTATIN CALCIUM 10 MG PO TABS
10.0000 mg | ORAL_TABLET | Freq: Every day | ORAL | Status: DC
  Administered 2023-11-20 – 2023-11-23 (×4): 10 mg via ORAL
  Filled 2023-11-19 (×4): qty 1

## 2023-11-19 MED ORDER — DOXYCYCLINE HYCLATE 100 MG PO TABS
100.0000 mg | ORAL_TABLET | Freq: Once | ORAL | Status: AC
  Administered 2023-11-19: 100 mg via ORAL
  Filled 2023-11-19: qty 1

## 2023-11-19 MED ORDER — MELATONIN 5 MG PO TABS
5.0000 mg | ORAL_TABLET | Freq: Every evening | ORAL | Status: DC | PRN
  Administered 2023-11-19 – 2023-11-22 (×2): 5 mg via ORAL
  Filled 2023-11-19 (×2): qty 1

## 2023-11-19 MED ORDER — ACETAMINOPHEN 650 MG RE SUPP: 650.0000 mg | Freq: Four times a day (QID) | RECTAL | Status: DC | PRN

## 2023-11-19 MED ORDER — ALBUTEROL SULFATE (2.5 MG/3ML) 0.083% IN NEBU: 3.0000 mL | INHALATION_SOLUTION | Freq: Four times a day (QID) | RESPIRATORY_TRACT | Status: DC | PRN

## 2023-11-19 MED ORDER — ONDANSETRON HCL 4 MG PO TABS
4.0000 mg | ORAL_TABLET | Freq: Four times a day (QID) | ORAL | Status: DC | PRN
Start: 2023-11-19 — End: 2023-11-23

## 2023-11-19 NOTE — ED Triage Notes (Signed)
 Pt to ED with wife for nagging cough since 3 days, clear sputum. Hx CHF and PNA. Respirations unlabored, skin dry.

## 2023-11-19 NOTE — ED Provider Notes (Signed)
 [facility] Provider Note    Event Date/Time   First MD Initiated Contact with Patient 11/19/23 1209     (approximate)   History   Cough   HPI  Jerry Howe is a 68 y.o. male with nonischemic dilated cardiomyopathy, CHF, EF of 25%, hypertension, AICD presents who presents with 2 days of progressively worsening cough and general body aches.  Reports a productive cough but denies any chest pain or worsening shortness of breath.  He states that he has extremity edema at baseline and this is unchanged.  He presents with his wife who helps contribute to the history      Physical Exam   Triage Vital Signs: ED Triage Vitals  Encounter Vitals Group     BP 11/19/23 1129 (!) 136/93     Girls Systolic BP Percentile --      Girls Diastolic BP Percentile --      Boys Systolic BP Percentile --      Boys Diastolic BP Percentile --      Pulse Rate 11/19/23 1129 (!) 108     Resp 11/19/23 1129 20     Temp 11/19/23 1129 (!) 101 F (38.3 C)     Temp Source 11/19/23 1129 Oral     SpO2 11/19/23 1129 97 %     Weight 11/19/23 1132 187 lb (84.8 kg)     Height 11/19/23 1132 5' 9 (1.753 m)     Head Circumference --      Peak Flow --      Pain Score 11/19/23 1130 0     Pain Loc --      Pain Education --      Exclude from Growth Chart --     Most recent vital signs: Vitals:   11/19/23 1129  BP: (!) 136/93  Pulse: (!) 108  Resp: 20  Temp: (!) 101 F (38.3 C)  SpO2: 97%    Nursing Triage Note reviewed. Vital signs reviewed and patients oxygen saturation is normoxic  General: Patient is well nourished, well developed, awake and alert,  Head: Normocephalic and atraumatic Eyes: Normal inspection, extraocular muscles intact, no conjunctival pallor Ear, nose, throat: Normal external exam Neck: Normal range of motion Respiratory: Patient is in no respiratory distress, lungs with rales and rhonchi Cardiovascular: Patient is tachycardic, RR GI: Abd SNT with no  guarding or rebound  Back: Normal inspection of the back with good strength and range of motion throughout all ext Extremities: pulses intact with good cap refills, 1+ LE pitting edema no calf tenderness Neuro: The patient is alert and oriented to person, place, and time, appropriately conversive, with 5/5 bilat UE/LE strength, no gross motor or sensory defects noted. Coordination appears to be adequate. Skin: Warm, dry, and intact Psych: normal mood and affect, no SI or HI  ED Results / Procedures / Treatments   Labs (all labs ordered are listed, but only abnormal results are displayed) Labs Reviewed  BASIC METABOLIC PANEL WITH GFR - Abnormal; Notable for the following components:      Result Value   Sodium 129 (*)    Potassium 5.2 (*)    Chloride 93 (*)    Glucose, Bld 168 (*)    BUN 31 (*)    Creatinine, Ser 1.68 (*)    GFR, Estimated 44 (*)    All other components within normal limits  CBC - Abnormal; Notable for the following components:   WBC 21.7 (*)    All  other components within normal limits  BRAIN NATRIURETIC PEPTIDE - Abnormal; Notable for the following components:   B Natriuretic Peptide >4,500.0 (*)    All other components within normal limits  RESP PANEL BY RT-PCR (RSV, FLU A&B, COVID)  [id number]  CULTURE, BLOOD (ROUTINE X 2)  CULTURE, BLOOD (ROUTINE X 2)  LACTIC ACID, PLASMA  LACTIC ACID, PLASMA     EKG EKG and rhythm strip are interpreted by myself:   EKG: [Normal sinus rhythm] at heart rate of 84, normal QRS duration, QTc 479, nonspecific ST segments and T waves PVCs EKG not consistent with Acute STEMI Rhythm strip: NSR in lead II   RADIOLOGY Xray chest: Consistent with pneumonia on my independent review and interpretation radiologist agrees    PROCEDURES:  Critical Care performed: No  Procedures   MEDICATIONS ORDERED IN ED: Medications  cefTRIAXone  (ROCEPHIN ) 2 g in sodium chloride  0.9 % 100 mL IVPB (2 g Intravenous New Bag/Given 11/19/23 1322)   sodium zirconium cyclosilicate  (LOKELMA ) packet 10 g (has no administration in time range)  acetaminophen  (TYLENOL ) tablet 650 mg (650 mg Oral Given 11/19/23 1137)  ketorolac  (TORADOL ) 15 MG/ML injection 15 mg (15 mg Intravenous Given 11/19/23 1320)  doxycycline  (VIBRA -TABS) tablet 100 mg (100 mg Oral Given 11/19/23 1323)     IMPRESSION / MDM / ASSESSMENT AND PLAN / ED COURSE                                Differential diagnosis includes, but is not limited to, pneumonia, URI, congestive heart failure, sepsis, electrolyte derangement  ED course: Patient surprisingly well-appearing however he does have a fever and white blood cell count is 21.7.  He does meet sepsis criteria and consequently blood cultures and lactate were ordered.  Will treat for community-acquired pneumonia at this time with ceftriaxone  and doxycycline .  I did consider giving him fluids however he appears hypervolemic, and BNP is greater than 4500 and given concern for fluid overload will hold off at this time until diuresis can begin.  It does appear that he is on torsemide  as an outpatient.  Case discussed with hospitalist for admission who will help facilitate diuresis.  Patient in agreement with plan   Clinical Course as of 11/19/23 1329  Sat Nov 19, 2023  1220 Temp(!): 101 F (38.3 C) [HD]  1220 WBC(!): 21.7 [HD]  1230 B Natriuretic Peptide(!): >4,500.0 Significantly more elevated than previous [HD]    Clinical Course User Index [HD] Nicholaus Rolland BRAVO, MD   -- Data(2/3 categories following were performed): I reviewed or ordered at least three unique tests, external notes, and/or the history required an independent historian as one of the three requirements as following: At least 3 labs/imaging studies were obtained and/or reviewed. AND  I discussed the management of the patient with the following external physician or qualified healthcare provider: Admitting physician  Risk: This patient has a high risk of  morbidity due to further diagnostic testing or treatment. Rationale: Decision made regarding admission  Admit Level 5 - Labs/Rads, Admit, Consult:  Suggested E/M Coding Level: 5, 99285  This level has been selected based on the Dec 14, 2021 CPT guidelines for E/M codes in the Emergency Department based on 2/3 of the CoPA, Data, and Risk.   FINAL CLINICAL IMPRESSION(S) / ED DIAGNOSES   Final diagnoses:  Sepsis, due to unspecified organism, unspecified whether acute organ dysfunction present (HCC)  Pneumonia due to infectious organism, unspecified laterality,  unspecified part of lung  Congestive heart failure, unspecified HF chronicity, unspecified heart failure type (HCC)     Rx / DC Orders   ED Discharge Orders     None        Note:  This document was prepared using Dragon voice recognition software and may include unintentional dictation errors.   Nicholaus Rolland BRAVO, MD 11/19/23 1329

## 2023-11-19 NOTE — H&P (Addendum)
 History and Physical    Patient: Jerry Howe FMW:[id number] DOB: Jan 22, 1956 DOA: 11/19/2023 DOS: the patient was seen and examined on 11/19/2023 PCP: Sampson Ethridge LABOR, MD  Patient coming from: Home  Chief Complaint:  Chief Complaint  Patient presents with   Cough   HPI: Jerry Howe is a 68 y.o. male with medical history significant of anxiety, unspecified V. tach, elevated troponin, CAD, nonischemic cardiomyopathy, ICD placement, history of V-fib on ICD interrogation, paroxysmal atrial fibrillation, chronic systolic heart failure, hypertension, hyperlipidemia, hypervitaminosis D, nephrolithiasis, stage IIIa CKD, history of COVID-19, elevated free T4 and low TSH level who presented to the emergency department with complaints of dyspnea, productive cough of clear sputum for the past 3 days.  He recently ate a burrito that felt salty to the taste. He denied fever, chills, rhinorrhea, sore throat, wheezing or hemoptysis.  No chest pain, palpitations, diaphoresis, PND, but has been having orthopnea and pitting edema of the lower extremities.  No abdominal pain, nausea, emesis, diarrhea, constipation, melena or hematochezia.  No flank pain, dysuria, frequency or hematuria.  No polyuria, polydipsia, polyphagia or blurred vision.   Lab work: Coronavirus, influenza and RSV PCR test was negative.  CBC showed a white count of 21.7, hemoglobin 13.9 g/dL and platelets 837.  BNP was greater than 4500 pg/mL.  BMP showed sodium 129 (corrected 131), potassium 5.2, chloride 93 and CO2 22 mmol/L with a normal anion gap.  Calcium  9.3, glucose 168, BUN 31 and creatinine 1.68 mg/dL.  Lactic acid was normal.  Procalcitonin was 20.26 ng/mL.  Imaging: 2 view chest radiograph showing right basilar opacity concerning for pneumonia.  He imaging follow-up recommended in 4 to 6 weeks.  There was also stable cardiomegaly.  ED course: Initial vital signs were temperature 101 F, pulse 108, respiration 20, BP 136/93 mmHg  and O2 sat 97% on room air.  The patient received 650 mg p.o. of acetaminophen , ceftriaxone  2 g IVPB, doxycycline  100 mg p.o., Toradol  15 mg IVP and I added Lokelma  10 g p.o. x 1.  After arriving to the stepdown area he received midodrine  10 mg p.o. x 1.   Review of Systems: As mentioned in the history of present illness. All other systems reviewed and are negative. Past Medical History:  Diagnosis Date   Anxiety    Arrhythmia    CHF (congestive heart failure) (HCC)    Hypertension    Kidney stones    Shortness of breath dyspnea    Past Surgical History:  Procedure Laterality Date   ATRIAL FIBRILLATION ABLATION N/A 05/24/2022   Procedure: ATRIAL FIBRILLATION ABLATION;  Surgeon: Cindie Ole DASEN, MD;  Location: [facility] INVASIVE CV LAB;  Service: Cardiovascular;  Laterality: N/A;   CARDIAC CATHETERIZATION Right 11/20/2014   Procedure: Left Heart Cath and Coronary Angiography;  Surgeon: Marsa Dooms, MD;  Location: [facility] INVASIVE CV LAB;  Service: Cardiovascular;  Laterality: Right;   IMPLANTABLE CARDIOVERTER DEFIBRILLATOR (ICD) GENERATOR CHANGE Left 08/15/2015   Procedure: ICD IMPLANT, single chamber;  Surgeon: Franky LITTIE Ned, MD;  Location: [facility] ORS;  Service: Cardiovascular;  Laterality: Left;   KNEE SURGERY     RIGHT/LEFT HEART CATH AND CORONARY ANGIOGRAPHY N/A 02/10/2022   Procedure: RIGHT/LEFT HEART CATH AND CORONARY ANGIOGRAPHY;  Surgeon: Mady Bruckner, MD;  Location: [facility] INVASIVE CV LAB;  Service: Cardiovascular;  Laterality: N/A;   TEE WITHOUT CARDIOVERSION N/A 05/24/2022   Procedure: TRANSESOPHAGEAL ECHOCARDIOGRAM;  Surgeon: Cindie Ole DASEN, MD;  Location: [facility] INVASIVE CV LAB;  Service: Cardiovascular;  Laterality:  N/A;   Social History:  reports that he has never smoked. He has never used smokeless tobacco. He reports that he does not currently use drugs. He reports that he does not drink alcohol.  Allergies  Allergen Reactions   Shellfish Allergy Anaphylaxis    Shellfish-Derived Products Anaphylaxis   Rosuvastatin      Dizziness     Family History  Problem Relation Age of Onset   Hyperlipidemia Mother    Hypertension Father    Heart disease Father    Hyperlipidemia Brother    Diabetes Brother    Heart failure Neg Hx     Prior to Admission medications   Medication Sig Start Date End Date Taking? Authorizing Provider  albuterol  (PROVENTIL  HFA;VENTOLIN  HFA) 108 (90 Base) MCG/ACT inhaler Inhale 1 puff into the lungs every 6 (six) hours as needed for wheezing or shortness of breath.    [provider]  amiodarone  (PACERONE ) 200 MG tablet Take 1 tablet (200 mg total) by mouth daily. 10/31/23   Gollan, Timothy J, MD  apixaban  (ELIQUIS ) 5 MG TABS tablet Take 1 tablet (5 mg total) by mouth 2 (two) times daily. 10/31/23   Gollan, Timothy J, MD  Ascorbic Acid  (VITAMIN C ) 1000 MG tablet Take 1,000 mg by mouth daily. Patient not taking: Reported on 10/31/2023    [provider]  ascorbic acid  (VITAMIN C ) 1000 MG tablet 1 tablet Orally Once a day for health maintenance    [provider]  Cholecalciferol 125 MCG (5000 UT) CHEW 1 capsule.    [provider]  cyanocobalamin  2000 MCG tablet Take 2,000 mcg by mouth daily.    [provider]  empagliflozin  (JARDIANCE ) 10 MG TABS tablet Take 1 tablet (10 mg total) by mouth daily. 10/31/23   Gollan, Timothy J, MD  EPINEPHrine (ADRENALIN) 30 MG/30ML SOLN injection as directed Injection As needed for severe allergic reaction Patient not taking: Reported on 10/31/2023    [provider]  EPINEPHrine 0.3 mg/0.3 mL IJ SOAJ injection Inject 0.3 mg IM single dose for 90 days may repeat every ~5 to 15 minutes if needed for allergic reaction Patient not taking: Reported on 10/31/2023 06/06/23   [provider]  fluticasone  (FLONASE ) 50 MCG/ACT nasal spray Place 1 spray into both nostrils in the morning and at bedtime. Patient not taking: Reported on 10/31/2023  05/26/23   [provider]  fluticasone  (FLONASE ) 50 MCG/ACT nasal spray 1 spray in each nostril Nasally Twice a day for 30 days for allergy symptoms Patient not taking: Reported on 10/31/2023    [provider]  glucose blood test strip Use as directed up to 3 times a day to check blood sugar for 100 days Patient not taking: Reported on 10/31/2023 08/11/23   [provider]  levocetirizine (XYZAL) 5 MG tablet 1 tablet in the evening Orally Once a day for 100 days for allergy symptoms Patient not taking: Reported on 10/31/2023 07/22/23   [provider]  Magnesium  200 MG TABS Take 200 mg by mouth daily.    [provider]  menthol -cetylpyridinium (CEPACOL) 3 MG lozenge Take 1 lozenge (3 mg total) by mouth as needed for sore throat. Patient not taking: Reported on 10/31/2023 05/30/23   Darci Pore, MD  metoprolol  succinate (TOPROL -XL) 25 MG 24 hr tablet Take 1 tablet (25 mg total) by mouth daily. 10/31/23   Gollan, Timothy J, MD  Multiple Vitamins-Minerals (SUPER THERA VITE M PO) Take 1 tablet by mouth daily.    [provider]  Naphazoline-Glycerin  (REDNESS RELIEF OP) Place 1 drop into both eyes 2 (two) times daily.    [provider]  omeprazole (PRILOSEC) 20 MG capsule Take 20 mg by mouth daily.    [provider]  omeprazole (PRILOSEC) 20 MG capsule 1 capsule. Patient not taking: Reported on 10/31/2023    [provider]  promethazine-dextromethorphan  (PROMETHAZINE-DM) 6.25-15 MG/5ML syrup Take 5 mLs by mouth 4 (four) times daily as needed for cough. 05/26/23   [provider]  pyridoxine  (B-6) 500 MG tablet Take 500 mg by mouth daily.    [provider]  rosuvastatin  (CRESTOR ) 10 MG tablet Take 1 tablet (10 mg total) by mouth daily. 10/31/23   Gollan, Timothy J, MD  sacubitril -valsartan  (ENTRESTO ) 49-51 MG Take 1 tablet by mouth 2 (two) times daily. 10/31/23   Gollan, Timothy J, MD  spironolactone   (ALDACTONE ) 25 MG tablet Take 1 tablet (25 mg total) by mouth daily. 10/31/23 10/30/24  Gollan, Timothy J, MD  torsemide  (DEMADEX ) 20 MG tablet Take 1 tablet (20 mg total) by mouth daily as needed. 10/31/23 10/30/24  Gollan, Timothy J, MD  Vitamin E 180 MG (400 UNIT) CAPS 1 capsule.    [provider]  Zinc Gluconate 100 MG TABS 1 tablet Orally Once a day for health maintenance    [provider]    Physical Exam: Vitals:   11/19/23 1129 11/19/23 1132  BP: (!) 136/93   Pulse: (!) 108   Resp: 20   Temp: (!) 101 F (38.3 C)   TempSrc: Oral   SpO2: 97%   Weight:  84.8 kg  Height:  5' 9 (1.753 m)   Physical Exam Vitals and nursing note reviewed.  Constitutional:      General: He is awake. He is not in acute distress.    Appearance: He is ill-appearing.  HENT:     Head: Normocephalic.     Nose: No rhinorrhea.  Eyes:     General: No scleral icterus.    Pupils: Pupils are equal, round, and reactive to light.  Neck:     Vascular: No JVD.  Cardiovascular:     Rate and Rhythm: Normal rate and regular rhythm.     Heart sounds: S1 normal and S2 normal.  Pulmonary:     Effort: No accessory muscle usage or respiratory distress.     Breath sounds: Rales present. No wheezing or rhonchi.  Abdominal:     General: Bowel sounds are normal. There is no distension.     Palpations: Abdomen is soft.  Musculoskeletal:     Cervical back: Neck supple.     Right lower leg: 1+ Pitting Edema present.     Left lower leg: 1+ Pitting Edema present.  Skin:    General: Skin is warm and dry.  Neurological:     General: No focal deficit present.     Mental Status: He is alert and oriented to person, place, and time.  Psychiatric:        Mood and Affect: Mood normal.        Behavior: Behavior normal. Behavior is cooperative.     Data Reviewed:  Results are pending, will review when available. 05/24/2022 TEE. IMPRESSIONS:   1. Left ventricular ejection fraction, by estimation,  is 20 to 25%. The  left ventricle has severely decreased function. The left ventricle  demonstrates global hypokinesis.   2. Right ventricular systolic function is moderately reduced. The right  ventricular size is normal.  3. Prominent pectinate muscles in LA appendage . Left atrial size was  mildly dilated. No left atrial/left atrial appendage thrombus was  detected.   4. Mild to moderate mitral valve regurgitation.   5. The aortic valve is tricuspid. Aortic valve regurgitation is trivial.   EKG: Vent. rate 84 BPM PR interval 172 ms QRS duration 118 ms QT/QTcB 406/479 ms P-R-T axes 74 -28 12 Sinus rhythm with occasional Premature ventricular complexes Minimal voltage criteria for LVH, may be normal variant ( Cornell product ) Septal infarct , age undetermined Abnormal ECG  Assessment and Plan: Principal Problem:   Sepsis due to pneumonia (HCC) Normal lactic acid level. However, has been hypotensive. Midodrine  10 mg p.o. x 1 given. Admit to SDU/inpatient. Continue supplemental oxygen. As needed bronchodilators. Continue ceftriaxone  1 g IVPB daily. Continue azithromycin  500 mg IVPB daily. Check strep pneumoniae urinary antigen. Check sputum Gram stain, culture and sensitivity. Follow-up blood culture and sensitivity. Follow-up CBC and chemistry in the morning.  Active Problems:   Chronic HFrEF  (heart failure with reduced ejection fraction) (HCC) In the setting of:   NICM (nonischemic cardiomyopathy) (HCC)   Coronary artery disease due to lipid rich plaque Hypotensive. Will hold diuretics and Entresto . Hold metoprolol  succinate. Continue rosuvastatin . Will check echocardiogram.    HTN (hypertension) Holding medications at this time. Continue to monitor blood pressure.    PAF (paroxysmal atrial fibrillation) (HCC) Continue amiodarone  200 mg p.o. daily. Continue apixaban  5 mg p.o. twice daily.    CKD stage 3a, GFR 45-59 ml/min (HCC) Holding diuretics at this  time. Renal function is at baseline. Will follow-up in the morning.    Hyperkalemia Hold spironolactone  and Entresto  Lokelma  10 mg p.o. x 1 given. Follow potassium level.    Gastroesophageal reflux disease without esophagitis Continue omeprazole or formulary equivalent.      Advance Care Planning:   Code Status: Full Code   Consults:   Family Communication:   Severity of Illness: The appropriate patient status for this patient is INPATIENT. Inpatient status is judged to be reasonable and necessary in order to provide the required intensity of service to ensure the patient's safety. The patient's presenting symptoms, physical exam findings, and initial radiographic and laboratory data in the context of their chronic comorbidities is felt to place them at high risk for further clinical deterioration. Furthermore, it is not anticipated that the patient will be medically stable for discharge from the hospital within 2 midnights of admission.   * I certify that at the point of admission it is my clinical judgment that the patient will require inpatient hospital care spanning beyond 2 midnights from the point of admission due to high intensity of service, high risk for further deterioration and high frequency of surveillance required.*  Author: Alm Dorn Castor, MD 11/19/2023 12:36 PM  For on call review [email or web address].   This document was prepared using Dragon voice recognition software and may contain some unintended transcription errors.

## 2023-11-20 ENCOUNTER — Inpatient Hospital Stay (HOSPITAL_COMMUNITY)
Admit: 2023-11-20 | Discharge: 2023-11-20 | Disposition: A | Discharge status: Home / Self Care | Attending: Internal Medicine

## 2023-11-20 DIAGNOSIS — K219 Gastro-esophageal reflux disease without esophagitis: Secondary | ICD-10-CM

## 2023-11-20 DIAGNOSIS — N1831 Chronic kidney disease, stage 3a: Secondary | ICD-10-CM

## 2023-11-20 DIAGNOSIS — I5021 Acute systolic (congestive) heart failure: Secondary | ICD-10-CM

## 2023-11-20 DIAGNOSIS — A419 Sepsis, unspecified organism: Secondary | ICD-10-CM | POA: Diagnosis not present

## 2023-11-20 DIAGNOSIS — I5022 Chronic systolic (congestive) heart failure: Secondary | ICD-10-CM

## 2023-11-20 DIAGNOSIS — I428 Other cardiomyopathies: Secondary | ICD-10-CM

## 2023-11-20 DIAGNOSIS — I959 Hypotension, unspecified: Secondary | ICD-10-CM | POA: Diagnosis not present

## 2023-11-20 DIAGNOSIS — E875 Hyperkalemia: Secondary | ICD-10-CM

## 2023-11-20 DIAGNOSIS — I480 Paroxysmal atrial fibrillation: Secondary | ICD-10-CM

## 2023-11-20 DIAGNOSIS — E871 Hypo-osmolality and hyponatremia: Secondary | ICD-10-CM

## 2023-11-20 DIAGNOSIS — J189 Pneumonia, unspecified organism: Secondary | ICD-10-CM | POA: Diagnosis not present

## 2023-11-20 DIAGNOSIS — N400 Enlarged prostate without lower urinary tract symptoms: Secondary | ICD-10-CM

## 2023-11-20 LAB — CBC
HCT: 39.4 % (ref 39.0–52.0)
Hemoglobin: 13 g/dL (ref 13.0–17.0)
MCH: 27.1 pg (ref 26.0–34.0)
MCHC: 33 g/dL (ref 30.0–36.0)
MCV: 82.3 fL (ref 80.0–100.0)
Platelets: 261 K/uL (ref 150–400)
RBC: 4.79 MIL/uL (ref 4.22–5.81)
RDW: 14.2 % (ref 11.5–15.5)
WBC: 21 K/uL — ABNORMAL HIGH (ref 4.0–10.5)
nRBC: 0 % (ref 0.0–0.2)

## 2023-11-20 LAB — COMPREHENSIVE METABOLIC PANEL WITH GFR
ALT: 21 U/L (ref 0–44)
AST: 47 U/L — ABNORMAL HIGH (ref 15–41)
Albumin: 3.1 g/dL — ABNORMAL LOW (ref 3.5–5.0)
Alkaline Phosphatase: 60 U/L (ref 38–126)
Anion gap: 11 (ref 5–15)
BUN: 39 mg/dL — ABNORMAL HIGH (ref 8–23)
CO2: 23 mmol/L (ref 22–32)
Calcium: 8.7 mg/dL — ABNORMAL LOW (ref 8.9–10.3)
Chloride: 94 mmol/L — ABNORMAL LOW (ref 98–111)
Creatinine, Ser: 1.55 mg/dL — ABNORMAL HIGH (ref 0.61–1.24)
GFR, Estimated: 49 mL/min — ABNORMAL LOW (ref >60)
Glucose, Bld: 158 mg/dL — ABNORMAL HIGH (ref 70–99)
Potassium: 4.5 mmol/L (ref 3.5–5.1)
Sodium: 128 mmol/L — ABNORMAL LOW (ref 135–145)
Total Bilirubin: 2.6 mg/dL — ABNORMAL HIGH (ref 0.0–1.2)
Total Protein: 7 g/dL (ref 6.5–8.1)

## 2023-11-20 LAB — EXPECTORATED SPUTUM ASSESSMENT W GRAM STAIN, RFLX TO RESP C

## 2023-11-20 LAB — ECHOCARDIOGRAM COMPLETE
AR max vel: 1.71 cm2
AV Peak grad: 5.2 mmHg
Ao pk vel: 1.14 m/s
Area-P 1/2: 5.27 cm2
Calc EF: 20.7 %
Est EF: <20
Height: 69 in
MV M vel: 3.98 m/s
MV Peak grad: 63.2 mmHg
MV VTI: 0.9 cm2
P 1/2 time: 505 ms
Radius: 0.4 cm
S' Lateral: 7.2 cm
Single Plane A2C EF: 17.4 %
Single Plane A4C EF: 23.5 %
Weight: 3061.75 [oz_av]

## 2023-11-20 LAB — FERRITIN: Ferritin: 662 ng/mL — ABNORMAL HIGH (ref 24–336)

## 2023-11-20 MED ORDER — ORAL CARE MOUTH RINSE: 15.0000 mL | OROMUCOSAL | Status: DC | PRN

## 2023-11-20 MED ORDER — SODIUM CHLORIDE 0.9 % IV SOLN
2.0000 g | INTRAVENOUS | Status: AC
  Administered 2023-11-20 – 2023-11-23 (×4): 2 g via INTRAVENOUS
  Filled 2023-11-20 (×4): qty 20

## 2023-11-20 MED ORDER — SODIUM CHLORIDE 0.9 % IV BOLUS
250.0000 mL | Freq: Once | INTRAVENOUS | Status: AC
  Administered 2023-11-20: 250 mL via INTRAVENOUS

## 2023-11-20 MED ORDER — PERFLUTREN LIPID MICROSPHERE
1.0000 mL | INTRAVENOUS | Status: AC | PRN
  Administered 2023-11-20: 5 mL via INTRAVENOUS

## 2023-11-20 MED ORDER — VITAMIN B-12 1000 MCG PO TABS
2000.0000 ug | ORAL_TABLET | Freq: Every day | ORAL | Status: DC
  Administered 2023-11-20 – 2023-11-23 (×4): 2000 ug via ORAL
  Filled 2023-11-20 (×4): qty 2

## 2023-11-20 MED ORDER — NAPHAZOLINE-GLYCERIN 0.012-0.25 % OP SOLN
1.0000 [drp] | Freq: Two times a day (BID) | OPHTHALMIC | Status: DC
  Administered 2023-11-20 – 2023-11-23 (×6): 1 [drp] via OPHTHALMIC
  Filled 2023-11-20: qty 15

## 2023-11-20 MED ORDER — SPIRONOLACTONE 12.5 MG HALF TABLET
12.5000 mg | ORAL_TABLET | Freq: Every day | ORAL | Status: DC
  Administered 2023-11-20: 12.5 mg via ORAL
  Filled 2023-11-20 (×2): qty 1

## 2023-11-20 MED ORDER — TAMSULOSIN HCL 0.4 MG PO CAPS
0.4000 mg | ORAL_CAPSULE | Freq: Every day | ORAL | Status: DC
  Administered 2023-11-20 – 2023-11-22 (×4): 0.4 mg via ORAL
  Filled 2023-11-20 (×4): qty 1

## 2023-11-20 MED ORDER — FLUTICASONE PROPIONATE 50 MCG/ACT NA SUSP
1.0000 | Freq: Every day | NASAL | Status: DC
  Administered 2023-11-20 – 2023-11-23 (×4): 1 via NASAL
  Filled 2023-11-20: qty 16

## 2023-11-20 MED ORDER — MIDODRINE HCL 5 MG PO TABS
10.0000 mg | ORAL_TABLET | Freq: Once | ORAL | Status: AC
  Administered 2023-11-20: 10 mg via ORAL
  Filled 2023-11-20: qty 2

## 2023-11-20 MED ORDER — TORSEMIDE 20 MG PO TABS
20.0000 mg | ORAL_TABLET | Freq: Every day | ORAL | Status: DC
  Administered 2023-11-20: 20 mg via ORAL
  Filled 2023-11-20: qty 1

## 2023-11-20 NOTE — Assessment & Plan Note (Signed)
 Patient does not take Flomax  at home will discontinue.

## 2023-11-20 NOTE — Progress Notes (Signed)
       CROSS COVER NOTE  NAME: REVEL STELLMACH MRN: [id number] DOB : 01/26/1956    Concern as stated by nurse / staff   Pt admitted 8/2 w/ sepsis related to PNA. His blood pressure is low this evening 70's/50's. He was given Torsemide  20mg  @ 1726. He does have an extensive cardiac history. He is a bit drowsy, but responds to voice and has no complaints of pain/discomfort. Please advise.      Pertinent findings on chart review: Chear briefly reviewed  Dialysis patient Admitted with sepsis but also has systolic CHF so was not fully bolused. Received midodrine  on 8/2 but not on it chronically  Patient Assessment    11/20/2023    8:00 PM 11/20/2023    7:50 PM 11/20/2023    7:30 PM  Vitals with BMI  Systolic 75 74   Diastolic 52 59   Pulse 83 88 95     Assessment and  Interventions   Assessment:  Hypotension in the setting of sepsis ESRD Systolic CHF compensated  Plan: NS 250ml bolus and intermittent bolus prn Midodrine  10mg  x1 Continue to monitor with goal to MAP >65 X X      11/21/2023   12:00 AM 11/20/2023   11:30 PM 11/20/2023   11:00 PM  Vitals with BMI  Systolic 78 85 82  Diastolic 53 55 65  Pulse 80 83 86   Will rebolus

## 2023-11-20 NOTE — Assessment & Plan Note (Signed)
 EF less than 20%.  Will consult heart failure team tomorrow.

## 2023-11-20 NOTE — Assessment & Plan Note (Signed)
 Patient on Eliquis  for anticoagulation.

## 2023-11-20 NOTE — Assessment & Plan Note (Signed)
 Improved

## 2023-11-20 NOTE — Plan of Care (Signed)
  Problem: Education: Goal: Knowledge of General Education information will improve Description: Including pain rating scale, medication(s)/side effects and non-pharmacologic comfort measures Outcome: Progressing   Problem: Health Behavior/Discharge Planning: Goal: Ability to manage health-related needs will improve Outcome: Progressing   Problem: Clinical Measurements: Goal: Ability to maintain clinical measurements within normal limits will improve Outcome: Progressing Goal: Will remain free from infection Outcome: Progressing Goal: Diagnostic test results will improve Outcome: Progressing Goal: Respiratory complications will improve Outcome: Progressing Goal: Cardiovascular complication will be avoided Outcome: Progressing   Problem: Activity: Goal: Risk for activity intolerance will decrease Outcome: Progressing   Problem: Nutrition: Goal: Adequate nutrition will be maintained Outcome: Progressing   Problem: Coping: Goal: Level of anxiety will decrease Outcome: Progressing   Problem: Elimination: Goal: Will not experience complications related to bowel motility Outcome: Progressing Goal: Will not experience complications related to urinary retention Outcome: Progressing   Problem: Pain Managment: Goal: General experience of comfort will improve and/or be controlled Outcome: Progressing   Problem: Safety: Goal: Ability to remain free from injury will improve Outcome: Progressing   Problem: Skin Integrity: Goal: Risk for impaired skin integrity will decrease Outcome: Progressing   Problem: Fluid Volume: Goal: Hemodynamic stability will improve Outcome: Progressing   Problem: Clinical Measurements: Goal: Diagnostic test results will improve Outcome: Progressing Goal: Signs and symptoms of infection will decrease Outcome: Progressing   Problem: Respiratory: Goal: Ability to maintain adequate ventilation will improve Outcome: Progressing   Problem:  Activity: Goal: Ability to tolerate increased activity will improve Outcome: Progressing   Problem: Clinical Measurements: Goal: Ability to maintain a body temperature in the normal range will improve Outcome: Progressing   Problem: Respiratory: Goal: Ability to maintain adequate ventilation will improve Outcome: Progressing Goal: Ability to maintain a clear airway will improve Outcome: Progressing

## 2023-11-20 NOTE — Assessment & Plan Note (Signed)
 Looks like this is chronic for this patient likely secondary to heart failure.

## 2023-11-20 NOTE — Assessment & Plan Note (Signed)
 Continue Jardiance .  Restart low-dose spironolactone .  Currently holding Toprol  and Entresto .  Will consult heart failure team tomorrow.

## 2023-11-20 NOTE — Progress Notes (Signed)
 Progress Note   Patient: Jerry Howe FMW:[id number] DOB: 02-Oct-1955 DOA: 11/19/2023     1 DOS: the patient was seen and examined on 11/20/2023   Brief hospital course: 68 y.o. male with medical history significant of anxiety, unspecified V. tach, elevated troponin, CAD, nonischemic cardiomyopathy, ICD placement, history of V-fib on ICD interrogation, paroxysmal atrial fibrillation, chronic systolic heart failure, hypertension, hyperlipidemia, hypervitaminosis D, nephrolithiasis, stage IIIa CKD, history of COVID-19, elevated free T4 and low TSH level who presented to the emergency department with complaints of dyspnea, productive cough of clear sputum for the past 3 days.  He recently ate a burrito that felt salty to the taste. He denied fever, chills, rhinorrhea, sore throat, wheezing or hemoptysis.  No chest pain, palpitations, diaphoresis, PND, but has been having orthopnea and pitting edema of the lower extremities.  No abdominal pain, nausea, emesis, diarrhea, constipation, melena or hematochezia.  No flank pain, dysuria, frequency or hematuria.  No polyuria, polydipsia, polyphagia or blurred vision.    Lab work: Coronavirus, influenza and RSV PCR test was negative.  CBC showed a white count of 21.7, hemoglobin 13.9 g/dL and platelets 837.  BNP was greater than 4500 pg/mL.  BMP showed sodium 129 (corrected 131), potassium 5.2, chloride 93 and CO2 22 mmol/L with a normal anion gap.  Calcium  9.3, glucose 168, BUN 31 and creatinine 1.68 mg/dL.  Lactic acid was normal.  Procalcitonin was 20.26 ng/mL.   Imaging: 2 view chest radiograph showing right basilar opacity concerning for pneumonia.  Follow-up recommended in 4 to 6 weeks.  There was also stable cardiomegaly.   ED course: Initial vital signs were temperature 101 F, pulse 108, respiration 20, BP 136/93 mmHg and O2 sat 97% on room air.  The patient received 650 mg p.o. of acetaminophen , ceftriaxone  2 g IVPB, doxycycline  100 mg p.o., Toradol  15 mg  IVP and I added Lokelma  10 g p.o. x 1.  After arriving to the stepdown area he received midodrine  10 mg p.o. x 1.  8/3.  Patient's blood pressure improved from yesterday.  Will restart spironolactone  at lower dose.  Continue Rocephin  and doxycycline  with fever and pneumonia.    Assessment and Plan: * Sepsis due to pneumonia [facility]) Present on admission with fever tachycardia tachypnea and lobar pneumonia right base.  Patient on doxycycline  and Rocephin .  Will get sputum culture.  So far blood cultures negative for less than 24 hours.  Hypotension Check orthostatic vital signs.  Blood pressure good enough to try to restart medications.  Will continue Jardiance  and restart low-dose spironolactone .  Chronic HFrEF (heart failure with reduced ejection fraction) (HCC) Continue Jardiance .  Restart low-dose spironolactone .  Currently holding Toprol  and Entresto .  Will consult heart failure team tomorrow.  NICM (nonischemic cardiomyopathy) (HCC) EF less than 20%.  Will consult heart failure team tomorrow.  PAF (paroxysmal atrial fibrillation) (HCC) Patient on Eliquis  for anticoagulation.  CKD stage 3a, GFR 45-59 ml/min (HCC) Creatinine 1.55 with a GFR 49  Hyperkalemia Improved  Hyponatremia Looks like this is chronic for this patient likely secondary to heart failure.  Gastroesophageal reflux disease without esophagitis On PPI  BPH (benign prostatic hyperplasia) On Flomax         Subjective: Patient feels okay.  Has a cough which is mostly whitish phlegm.  Admitted with hypotension and sepsis and pneumonia.  Physical Exam: Vitals:   11/20/23 0900 11/20/23 1000 11/20/23 1015 11/20/23 1100  BP: 122/84 99/81 117/88   Pulse: (!) 102 (!) 101 (!) 102 99  Resp: 17 (!) 25  (!) 24  Temp: (!) 100.6 F (38.1 C)     TempSrc: Oral     SpO2: 96% 98%  96%  Weight:      Height:       Physical Exam HENT:     Head: Normocephalic.  Eyes:     General: Lids are normal.  Cardiovascular:      Rate and Rhythm: Normal rate and regular rhythm.     Heart sounds: Normal heart sounds, S1 normal and S2 normal.  Pulmonary:     Breath sounds: Examination of the right-lower field reveals decreased breath sounds. Examination of the left-lower field reveals decreased breath sounds. Decreased breath sounds present. No wheezing, rhonchi or rales.  Abdominal:     Palpations: Abdomen is soft.     Tenderness: There is no abdominal tenderness.  Musculoskeletal:     Right lower leg: No swelling.     Left lower leg: No swelling.  Skin:    General: Skin is warm.     Findings: No rash.  Neurological:     Mental Status: He is alert and oriented to person, place, and time.     Data Reviewed: Sodium 128, potassium 4.5, creatinine 1.55, GFR 49, White blood cell count 21, hemoglobin 13, platelet count 261  Family Communication: Family at bedside  Disposition: Status is: Inpatient Remains inpatient appropriate because: Still having fever.  Continue IV antibiotics for pneumonia.  Try to restart Aldactone  for heart failure.  Planned Discharge Destination: Home    Time spent: 28 minutes  Author: Charlie Patterson, MD 11/20/2023 11:10 AM  For on call review [email or web address].

## 2023-11-20 NOTE — Progress Notes (Signed)
  Echocardiogram 2D Echocardiogram has been performed. Definity  IV ultrasound imaging agent used on this study.  Jerry Howe 11/20/2023, 10:10 AM

## 2023-11-20 NOTE — Assessment & Plan Note (Signed)
 Check orthostatic vital signs.  Blood pressure good enough to try to restart medications.  Will continue Jardiance  and restart low-dose spironolactone .

## 2023-11-20 NOTE — Assessment & Plan Note (Signed)
 On PPI

## 2023-11-20 NOTE — Assessment & Plan Note (Signed)
 Creatinine 1.55 with a GFR 49

## 2023-11-20 NOTE — Assessment & Plan Note (Signed)
 Present on admission with fever tachycardia tachypnea and lobar pneumonia right base.  Patient on doxycycline  and Rocephin .  Will get sputum culture.  So far blood cultures negative for less than 24 hours.

## 2023-11-20 NOTE — Progress Notes (Signed)
 2026 - Pt hypotensive w/ BP 70's-50's. Oriented x4. Drowsy, but easily arousable. Denies any pain/discomfort or dizziness/lightheadedness. Dr. Cleatus notified. New orders received for Midodrine  and NS 250 bolus.   9964 - Pt continues to have low BP. 78-83/45-65. Most times his MAP is greater than 60. When he was awake, his BP was 90's/60's. Mentation is still appropriate. No c/o chest pain or dizziness/lightheadedness. Provider notified. Awaiting response.   36 - New order received for additional 250mL IVF bolus. Medication administered per orders.   0500 - Pt reports that he is feeling better this morning and reports that he feels well rested. Pt stood at bedside and used urinal. Pt able to get back into bed without difficulties.

## 2023-11-20 NOTE — Hospital Course (Signed)
 68 y.o. male with medical history significant of anxiety, unspecified V. tach, elevated troponin, CAD, nonischemic cardiomyopathy, ICD placement, history of V-fib on ICD interrogation, paroxysmal atrial fibrillation, chronic systolic heart failure, hypertension, hyperlipidemia, hypervitaminosis D, nephrolithiasis, stage IIIa CKD, history of COVID-19, elevated free T4 and low TSH level who presented to the emergency department with complaints of dyspnea, productive cough of clear sputum for the past 3 days.  He recently ate a burrito that felt salty to the taste. He denied fever, chills, rhinorrhea, sore throat, wheezing or hemoptysis.  No chest pain, palpitations, diaphoresis, PND, but has been having orthopnea and pitting edema of the lower extremities.  No abdominal pain, nausea, emesis, diarrhea, constipation, melena or hematochezia.  No flank pain, dysuria, frequency or hematuria.  No polyuria, polydipsia, polyphagia or blurred vision.    Lab work: Coronavirus, influenza and RSV PCR test was negative.  CBC showed a white count of 21.7, hemoglobin 13.9 g/dL and platelets 837.  BNP was greater than 4500 pg/mL.  BMP showed sodium 129 (corrected 131), potassium 5.2, chloride 93 and CO2 22 mmol/L with a normal anion gap.  Calcium  9.3, glucose 168, BUN 31 and creatinine 1.68 mg/dL.  Lactic acid was normal.  Procalcitonin was 20.26 ng/mL.   Imaging: 2 view chest radiograph showing right basilar opacity concerning for pneumonia.  Follow-up recommended in 4 to 6 weeks.  There was also stable cardiomegaly.   ED course: Initial vital signs were temperature 101 F, pulse 108, respiration 20, BP 136/93 mmHg and O2 sat 97% on room air.  The patient received 650 mg p.o. of acetaminophen , ceftriaxone  2 g IVPB, doxycycline  100 mg p.o., Toradol  15 mg IVP and I added Lokelma  10 g p.o. x 1.  After arriving to the stepdown area he received midodrine  10 mg p.o. x 1.  8/3.  Patient's blood pressure improved from yesterday.   Will restart spironolactone  at lower dose.  Continue Rocephin  and doxycycline  with fever and pneumonia. 8/4.  Ferritin elevated at 662 and hemoglobin 13.3.  No need for IV iron .  Patient's blood pressure low overnight.  Held medications.  White blood cell count trending down to 15.2. 8/5.  Patient has blood pressure still low.  Feeling better with regards to pneumonia. 8/6.  Patient has blood pressure still low but asymptomatic.  Will have to go home without cardiac meds for heart failure except for torsemide  and Jardiance .  Switch Rocephin  over to Augmentin  for tomorrow and continue doxycycline .

## 2023-11-21 ENCOUNTER — Ambulatory Visit

## 2023-11-21 ENCOUNTER — Encounter

## 2023-11-21 DIAGNOSIS — I471 Supraventricular tachycardia, unspecified: Secondary | ICD-10-CM

## 2023-11-21 DIAGNOSIS — I5022 Chronic systolic (congestive) heart failure: Secondary | ICD-10-CM | POA: Diagnosis not present

## 2023-11-21 DIAGNOSIS — N1832 Chronic kidney disease, stage 3b: Secondary | ICD-10-CM

## 2023-11-21 DIAGNOSIS — I9589 Other hypotension: Secondary | ICD-10-CM

## 2023-11-21 DIAGNOSIS — J189 Pneumonia, unspecified organism: Secondary | ICD-10-CM | POA: Diagnosis not present

## 2023-11-21 DIAGNOSIS — I480 Paroxysmal atrial fibrillation: Secondary | ICD-10-CM | POA: Diagnosis not present

## 2023-11-21 DIAGNOSIS — I5082 Biventricular heart failure: Secondary | ICD-10-CM | POA: Diagnosis not present

## 2023-11-21 DIAGNOSIS — A419 Sepsis, unspecified organism: Secondary | ICD-10-CM | POA: Diagnosis not present

## 2023-11-21 LAB — CBC
HCT: 38.9 % — ABNORMAL LOW (ref 39.0–52.0)
Hemoglobin: 13.3 g/dL (ref 13.0–17.0)
MCH: 28.1 pg (ref 26.0–34.0)
MCHC: 34.2 g/dL (ref 30.0–36.0)
MCV: 82.1 fL (ref 80.0–100.0)
Platelets: 264 K/uL (ref 150–400)
RBC: 4.74 MIL/uL (ref 4.22–5.81)
RDW: 14.4 % (ref 11.5–15.5)
WBC: 15.2 K/uL — ABNORMAL HIGH (ref 4.0–10.5)
nRBC: 0 % (ref 0.0–0.2)

## 2023-11-21 LAB — BASIC METABOLIC PANEL WITH GFR
Anion gap: 13 (ref 5–15)
BUN: 36 mg/dL — ABNORMAL HIGH (ref 8–23)
CO2: 25 mmol/L (ref 22–32)
Calcium: 8.7 mg/dL — ABNORMAL LOW (ref 8.9–10.3)
Chloride: 94 mmol/L — ABNORMAL LOW (ref 98–111)
Creatinine, Ser: 1.41 mg/dL — ABNORMAL HIGH (ref 0.61–1.24)
GFR, Estimated: 55 mL/min — ABNORMAL LOW (ref >60)
Glucose, Bld: 114 mg/dL — ABNORMAL HIGH (ref 70–99)
Potassium: 4.4 mmol/L (ref 3.5–5.1)
Sodium: 132 mmol/L — ABNORMAL LOW (ref 135–145)

## 2023-11-21 LAB — MAGNESIUM: Magnesium: 2.1 mg/dL (ref 1.7–2.4)

## 2023-11-21 MED ORDER — SODIUM CHLORIDE 0.9 % IV BOLUS
250.0000 mL | Freq: Once | INTRAVENOUS | Status: AC
  Administered 2023-11-21: 250 mL via INTRAVENOUS

## 2023-11-21 MED ORDER — CHLORHEXIDINE GLUCONATE CLOTH 2 % EX PADS
6.0000 | MEDICATED_PAD | Freq: Every day | CUTANEOUS | Status: DC
  Administered 2023-11-21 – 2023-11-22 (×2): 6 via TOPICAL

## 2023-11-21 MED ORDER — TORSEMIDE 20 MG PO TABS
20.0000 mg | ORAL_TABLET | Freq: Every day | ORAL | Status: DC
  Administered 2023-11-21 – 2023-11-23 (×3): 20 mg via ORAL
  Filled 2023-11-21 (×3): qty 1

## 2023-11-21 MED ORDER — POLYETHYLENE GLYCOL 3350 17 G PO PACK
17.0000 g | PACK | Freq: Every day | ORAL | Status: DC
  Administered 2023-11-21 – 2023-11-23 (×3): 17 g via ORAL
  Filled 2023-11-21 (×3): qty 1

## 2023-11-21 MED ORDER — AMIODARONE HCL 200 MG PO TABS
200.0000 mg | ORAL_TABLET | Freq: Two times a day (BID) | ORAL | Status: DC
Start: 2023-11-21 — End: 2023-11-22
  Administered 2023-11-21 – 2023-11-22 (×2): 200 mg via ORAL
  Filled 2023-11-21 (×2): qty 1

## 2023-11-21 NOTE — Plan of Care (Signed)
  Problem: Education: Goal: Knowledge of General Education information will improve Description: Including pain rating scale, medication(s)/side effects and non-pharmacologic comfort measures Outcome: Progressing   Problem: Health Behavior/Discharge Planning: Goal: Ability to manage health-related needs will improve Outcome: Progressing   Problem: Clinical Measurements: Goal: Ability to maintain clinical measurements within normal limits will improve Outcome: Progressing Goal: Will remain free from infection Outcome: Progressing Goal: Diagnostic test results will improve Outcome: Progressing Goal: Respiratory complications will improve Outcome: Progressing Goal: Cardiovascular complication will be avoided Outcome: Progressing   Problem: Activity: Goal: Risk for activity intolerance will decrease Outcome: Progressing   Problem: Nutrition: Goal: Adequate nutrition will be maintained Outcome: Progressing   Problem: Coping: Goal: Level of anxiety will decrease Outcome: Progressing   Problem: Elimination: Goal: Will not experience complications related to bowel motility Outcome: Progressing Goal: Will not experience complications related to urinary retention Outcome: Progressing   Problem: Pain Managment: Goal: General experience of comfort will improve and/or be controlled Outcome: Progressing   Problem: Safety: Goal: Ability to remain free from injury will improve Outcome: Progressing   Problem: Skin Integrity: Goal: Risk for impaired skin integrity will decrease Outcome: Progressing   Problem: Fluid Volume: Goal: Hemodynamic stability will improve Outcome: Progressing   Problem: Clinical Measurements: Goal: Diagnostic test results will improve Outcome: Progressing Goal: Signs and symptoms of infection will decrease Outcome: Progressing   Problem: Respiratory: Goal: Ability to maintain adequate ventilation will improve Outcome: Progressing   Problem:  Activity: Goal: Ability to tolerate increased activity will improve Outcome: Progressing   Problem: Clinical Measurements: Goal: Ability to maintain a body temperature in the normal range will improve Outcome: Progressing   Problem: Respiratory: Goal: Ability to maintain adequate ventilation will improve Outcome: Progressing Goal: Ability to maintain a clear airway will improve Outcome: Progressing

## 2023-11-21 NOTE — Progress Notes (Signed)
 Progress Note   Patient: Jerry Howe FMW:[id number] DOB: 09-11-1955 DOA: 11/19/2023     2 DOS: the patient was seen and examined on 11/21/2023   Brief hospital course: 68 y.o. male with medical history significant of anxiety, unspecified V. tach, elevated troponin, CAD, nonischemic cardiomyopathy, ICD placement, history of V-fib on ICD interrogation, paroxysmal atrial fibrillation, chronic systolic heart failure, hypertension, hyperlipidemia, hypervitaminosis D, nephrolithiasis, stage IIIa CKD, history of COVID-19, elevated free T4 and low TSH level who presented to the emergency department with complaints of dyspnea, productive cough of clear sputum for the past 3 days.  He recently ate a burrito that felt salty to the taste. He denied fever, chills, rhinorrhea, sore throat, wheezing or hemoptysis.  No chest pain, palpitations, diaphoresis, PND, but has been having orthopnea and pitting edema of the lower extremities.  No abdominal pain, nausea, emesis, diarrhea, constipation, melena or hematochezia.  No flank pain, dysuria, frequency or hematuria.  No polyuria, polydipsia, polyphagia or blurred vision.    Lab work: Coronavirus, influenza and RSV PCR test was negative.  CBC showed a white count of 21.7, hemoglobin 13.9 g/dL and platelets 837.  BNP was greater than 4500 pg/mL.  BMP showed sodium 129 (corrected 131), potassium 5.2, chloride 93 and CO2 22 mmol/L with a normal anion gap.  Calcium  9.3, glucose 168, BUN 31 and creatinine 1.68 mg/dL.  Lactic acid was normal.  Procalcitonin was 20.26 ng/mL.   Imaging: 2 view chest radiograph showing right basilar opacity concerning for pneumonia.  Follow-up recommended in 4 to 6 weeks.  There was also stable cardiomegaly.   ED course: Initial vital signs were temperature 101 F, pulse 108, respiration 20, BP 136/93 mmHg and O2 sat 97% on room air.  The patient received 650 mg p.o. of acetaminophen , ceftriaxone  2 g IVPB, doxycycline  100 mg p.o., Toradol  15 mg  IVP and I added Lokelma  10 g p.o. x 1.  After arriving to the stepdown area he received midodrine  10 mg p.o. x 1.  8/3.  Patient's blood pressure improved from yesterday.  Will restart spironolactone  at lower dose.  Continue Rocephin  and doxycycline  with fever and pneumonia. 8/4.  Ferritin elevated at 662 and hemoglobin 13.3.  No need for IV iron .  Patient's blood pressure low overnight.  Held medications.  White blood cell count trending down to 15.2.    Assessment and Plan: * Sepsis due to pneumonia [facility]) Present on admission with fever tachycardia tachypnea and lobar pneumonia right base.  Patient on doxycycline  and Rocephin .  Sputum culture growing few gram-positive cocci, few gram-negative rods and rare gram-positive rods.  So far blood cultures negative for less than 24 hours (has not been updated yet).  Hypotension Blood pressure on the lower side.  Holding Entresto , Toprol , spironolactone .  Patient on Jardiance  and torsemide .  Patient asymptomatic with low blood pressure.  Chronic HFrEF (heart failure with reduced ejection fraction) (HCC) Continue Jardiance  and torsemide .  Blood pressure too low for Entresto , Toprol  and spironolactone  at this time.  Appreciate heart failure team consultation.  NICM (nonischemic cardiomyopathy) (HCC) EF less than 20%.  Blood pressure too low for most heart failure medications at this point  PAF (paroxysmal atrial fibrillation) (HCC) Patient on Eliquis  for anticoagulation.  CKD stage 3a, GFR 45-59 ml/min (HCC) Creatinine 1.41 with a GFR of 55  Hyperkalemia Improved  Hyponatremia Looks like this is chronic for this patient likely secondary to heart failure.  Gastroesophageal reflux disease without esophagitis On PPI  BPH (benign prostatic hyperplasia)  On Flomax         Subjective: Patient not feeling lightheaded or dizzy with blood pressure being on the lower side.  Admitted with sepsis secondary to pneumonia.  Still has some  cough.  Physical Exam: Vitals:   11/21/23 0600 11/21/23 0700 11/21/23 0749 11/21/23 0800  BP: (!) 86/64 119/81  (!) 94/55  Pulse: 86 (!) 43 93 68  Resp: (!) 25 (!) 27 (!) 27 (!) 22  Temp:    98.4 F (36.9 C)  TempSrc:    Oral  SpO2: 94% 98% 97% 97%  Weight:      Height:       Physical Exam HENT:     Head: Normocephalic.  Eyes:     General: Lids are normal.  Cardiovascular:     Rate and Rhythm: Normal rate and regular rhythm.     Heart sounds: Normal heart sounds, S1 normal and S2 normal.  Pulmonary:     Breath sounds: Examination of the right-lower field reveals decreased breath sounds. Examination of the left-lower field reveals decreased breath sounds. Decreased breath sounds present. No wheezing, rhonchi or rales.  Abdominal:     Palpations: Abdomen is soft.     Tenderness: There is no abdominal tenderness.  Musculoskeletal:     Right lower leg: No swelling.     Left lower leg: No swelling.  Skin:    General: Skin is warm.     Findings: No rash.  Neurological:     Mental Status: He is alert and oriented to person, place, and time.     Data Reviewed: White blood count 15.2, hemoglobin 13.3, platelet count 264, ferritin 662, GFR 55, creatinine 1.41, sodium 132  Family Communication: Spoke with wife at the bedside  Disposition: Status is: Inpatient Remains inpatient appropriate because: Continue antibiotics for pneumonia.  With blood pressure being on the lower side limited on medications for heart failure.  Planned Discharge Destination: Home    Time spent: 28 minutes  Author: Charlie Patterson, MD 11/21/2023 11:53 AM  For on call review [email or web address].

## 2023-11-21 NOTE — Plan of Care (Signed)
  Problem: Clinical Measurements: Goal: Ability to maintain clinical measurements within normal limits will improve Outcome: Not Progressing Goal: Diagnostic test results will improve Outcome: Not Progressing Goal: Respiratory complications will improve Outcome: Progressing Goal: Cardiovascular complication will be avoided Outcome: Not Progressing   Problem: Nutrition: Goal: Adequate nutrition will be maintained Outcome: Progressing   Problem: Elimination: Goal: Will not experience complications related to bowel motility Outcome: Progressing Goal: Will not experience complications related to urinary retention Outcome: Progressing   Problem: Pain Managment: Goal: General experience of comfort will improve and/or be controlled Outcome: Progressing   Problem: Respiratory: Goal: Ability to maintain adequate ventilation will improve Outcome: Progressing Goal: Ability to maintain a clear airway will improve Outcome: Progressing

## 2023-11-21 NOTE — Progress Notes (Signed)
 Heart Failure Navigator Progress Note  Assessed for Heart & Vascular TOC clinic readiness.  Patient does not meet criteria due to current Advanced Heart Failure Team patient of Jules Oar, MD.   Navigator will sign off at this time.  Celedonio Coil, RN, BSN [facility] Heart Failure Navigator Secure Chat Only

## 2023-11-21 NOTE — Care Management Important Message (Signed)
 Important Message  Patient Details  Name: Jerry Howe MRN: [id number] Date of Birth: 1955/05/14   Important Message Given:  Yes - Medicare IM     [name] 11/21/2023, 10:05 AM

## 2023-11-21 NOTE — Consult Note (Addendum)
 Advanced Heart Failure Team Consult Note   Primary Physician: Entzminger, Ethridge LABOR, MD Cardiologist:  Timothy Gollan, MD HF Cardiologist: Dr Bensimhon   Reason for Consultation: Heart Failure   HPI:    Jerry Howe is seen today for evaluation of heart failure at the request of Dr Ginnie.   Jerry Howe is a 68 year old with a history of chronic HFrEF, single chamber ICD, NICM, PVCs, PAF, A fib Ablation 2024, and CKD Stage IIIb.   Device interrogation with Dr Perla 10/2023   7 total NSVT events, V-rates 188-226 bpm (on HF remote)  1 SVT event , longest x 1 min, V-rates 194-214 bpm.  R waves similar to intrinsic with brief salvos of possible ventricular driven tachy (on HF remote).  1 AF event x 10 min (on HF remote).   3 days prior to admit he developed a cough and increased shortness of breath. He tells me he had [place] food and few days later noticed orthopnea and lower extremity swelling.   Admitted with sepsis-->CAP. Creatinine 1.68, K 5.2 , Sodium 129, BNP > 4500, Lactic acid 1.9, WBC 21, Procalcitonin 20, and SARs negative. Blood cultures- NGTD. CXR- R basilar opacity concerning for pneumonia. Placed on antibiotics/ ceftriaxone  and azithromycin .  Over the weekend he developed hypotension. Given midodrine . Echo LVEF<20% RV moderately reduced. Grade II DD.   GDMT limited by hypotension. Overall feeling much better.   Pertinent Cardiac Test Reviewed Cath 2023 LAD 10-20%prox. RCA and LCX ok  RA 14 RV 90/13 PA 90/30 (50) PCWP 30 with v-waves 45 Ao sat 96% PA sat 67% Fick 4.7/2.2 PVR 4.3 WU   CPX 2023 Peak VO2: 13.9 (47.9% predicted peak VO2)  VE/VCO2 slope:  36.9  OUES: 1.58  Peak RER: 0.96  VE/MVV:  43.4%  O2pulse:  19mL/beat   (69% predicted O2pulse)   Home Medications Prior to Admission medications   Medication Sig Start Date End Date Taking? Authorizing Provider  albuterol  (PROVENTIL  HFA;VENTOLIN  HFA) 108 (90 Base) MCG/ACT inhaler Inhale 1 puff into the  lungs every 6 (six) hours as needed for wheezing or shortness of breath.   Yes [provider]  amiodarone  (PACERONE ) 200 MG tablet Take 1 tablet (200 mg total) by mouth daily. 10/31/23  Yes Gollan, Timothy J, MD  apixaban  (ELIQUIS ) 5 MG TABS tablet Take 1 tablet (5 mg total) by mouth 2 (two) times daily. 10/31/23  Yes Gollan, Timothy J, MD  Ascorbic Acid  (VITAMIN C ) 1000 MG tablet Take 1,000 mg by mouth daily.   Yes [provider]  ascorbic acid  (VITAMIN C ) 1000 MG tablet 1 tablet Orally Once a day for health maintenance   Yes [provider]  Cholecalciferol 125 MCG (5000 UT) CHEW 1 capsule.   Yes [provider]  cyanocobalamin  2000 MCG tablet Take 2,000 mcg by mouth daily.   Yes [provider]  empagliflozin  (JARDIANCE ) 10 MG TABS tablet Take 1 tablet (10 mg total) by mouth daily. 10/31/23  Yes Gollan, Timothy J, MD  fluticasone  (FLONASE ) 50 MCG/ACT nasal spray Place 1 spray into both nostrils in the morning and at bedtime. 05/26/23  Yes [provider]  fluticasone  (FLONASE ) 50 MCG/ACT nasal spray    Yes [provider]  metoprolol  succinate (TOPROL -XL) 25 MG 24 hr tablet Take 1 tablet (25 mg total) by mouth daily. 10/31/23  Yes Gollan, Timothy J, MD  Multiple Vitamins-Minerals (SUPER THERA VITE M PO) Take 1 tablet by mouth daily.   Yes [provider]  Naphazoline-Glycerin  (REDNESS RELIEF OP) Place 1 drop into both eyes 2 (two) times daily.   Yes [provider]  omeprazole (PRILOSEC) 20 MG capsule Take 20 mg by mouth daily.   Yes [provider]  omeprazole (PRILOSEC) 20 MG capsule 1 capsule.   Yes [provider]  pyridoxine  (B-6) 500 MG tablet Take 500 mg by mouth daily.   Yes [provider]  rosuvastatin  (CRESTOR ) 10 MG tablet Take 1 tablet (10 mg total) by mouth daily. 10/31/23  Yes Gollan, Timothy J, MD  sacubitril -valsartan  (ENTRESTO ) 49-51 MG Take 1 tablet by mouth 2 (two) times  daily. 10/31/23  Yes Gollan, Timothy J, MD  spironolactone  (ALDACTONE ) 25 MG tablet Take 1 tablet (25 mg total) by mouth daily. 10/31/23 10/30/24 Yes Gollan, Timothy J, MD  torsemide  (DEMADEX ) 20 MG tablet Take 1 tablet (20 mg total) by mouth daily as needed. 10/31/23 10/30/24 Yes Gollan, Timothy J, MD  Vitamin E 180 MG (400 UNIT) CAPS 1 capsule.   Yes [provider]  Zinc Gluconate 100 MG TABS 1 tablet Orally Once a day for health maintenance   Yes [provider]    Past Medical History: Past Medical History:  Diagnosis Date   Anxiety    Arrhythmia    CHF (congestive heart failure) (HCC)    Hypertension    Kidney stones    Shortness of breath dyspnea     Past Surgical History: Past Surgical History:  Procedure Laterality Date   ATRIAL FIBRILLATION ABLATION N/A 05/24/2022   Procedure: ATRIAL FIBRILLATION ABLATION;  Surgeon: Cindie Ole DASEN, MD;  Location: [facility] INVASIVE CV [facility];  Service: Cardiovascular;  Laterality: N/A;   CARDIAC CATHETERIZATION Right 11/20/2014   Procedure: Left Heart Cath and Coronary Angiography;  Surgeon: Marsa Dooms, MD;  Location: [facility] INVASIVE CV LAB;  Service: Cardiovascular;  Laterality: Right;   IMPLANTABLE CARDIOVERTER DEFIBRILLATOR (ICD) GENERATOR CHANGE Left 08/15/2015   Procedure: ICD IMPLANT, single chamber;  Surgeon: Franky LITTIE Ned, MD;  Location: [facility] ORS;  Service: Cardiovascular;  Laterality: Left;   KNEE SURGERY     RIGHT/LEFT HEART CATH AND CORONARY ANGIOGRAPHY N/A 02/10/2022   Procedure: RIGHT/LEFT HEART CATH AND CORONARY ANGIOGRAPHY;  Surgeon: Mady Bruckner, MD;  Location: [facility] INVASIVE CV LAB;  Service: Cardiovascular;  Laterality: N/A;   TEE WITHOUT CARDIOVERSION N/A 05/24/2022   Procedure: TRANSESOPHAGEAL ECHOCARDIOGRAM;  Surgeon: Cindie Ole DASEN, MD;  Location: [facility] INVASIVE CV LAB;  Service: Cardiovascular;  Laterality: N/A;    Family History: Family History  Problem Relation Age of Onset   Hyperlipidemia Mother     Hypertension Father    Heart disease Father    Hyperlipidemia Brother    Diabetes Brother    Heart failure Neg Hx     Social History: Social History   Socioeconomic History   Marital status: Married    Spouse name: Suzen   Number of children: 5   Years of education: Not on file   Highest education level: Not on file  Occupational History   Not on file  Tobacco Use   Smoking status: Never   Smokeless tobacco: Never   Tobacco comments:    Never smoke 06/21/22  Vaping Use   Vaping status: Never Used  Substance and Sexual Activity   Alcohol use: No   Drug use: Not Currently   Sexual activity: Yes    Partners: Female  Other Topics Concern   Not on file  Social History Narrative   Lives at home  with spouse; 5 kids, 11 grandkids & 2 great grandkids   Social Drivers of Corporate investment banker Strain: Not on file  Food Insecurity: No Food Insecurity (11/19/2023)   Hunger Vital Sign    Worried About Running Out of Food in the Last Year: Never true    Ran Out of Food in the Last Year: Never true  Transportation Needs: No Transportation Needs (11/19/2023)   PRAPARE - Administrator, Civil Service (Medical): No    Lack of Transportation (Non-Medical): No  Physical Activity: Not on file  Stress: Not on file  Social Connections: Socially Integrated (11/19/2023)   Social Connection and Isolation Panel    Frequency of Communication with Friends and Family: More than three times a week    Frequency of Social Gatherings with Friends and Family: More than three times a week    Attends Religious Services: More than 4 times per year    Active Member of [employer] or Organizations: Yes    Attends Banker Meetings: 1 to 4 times per year    Marital Status: Married    Allergies:  Allergies  Allergen Reactions   Shellfish Allergy Anaphylaxis   Shellfish-Derived Products Anaphylaxis   Rosuvastatin      Dizziness     Objective:    Vital Signs:   Temp:   [98.3 F (36.8 C)-100.6 F (38.1 C)] 98.6 F (37 C) (08/04 0340) Pulse Rate:  [27-146] 43 (08/04 0700) Resp:  [14-31] 27 (08/04 0700) BP: (68-122)/(45-91) 119/81 (08/04 0700) SpO2:  [85 %-100 %] 98 % (08/04 0700) Weight:  [86.5 kg] 86.5 kg (08/04 0400) Last BM Date : 11/19/23  Weight change: Filed Weights   11/19/23 1705 11/20/23 0500 11/21/23 0400  Weight: 85.3 kg 86.8 kg 86.5 kg    Intake/Output:   Intake/Output Summary (Last 24 hours) at 11/21/2023 0734 Last data filed at 11/21/2023 0500 Gross per 24 hour  Intake 959.76 ml  Output 2425 ml  Net -1465.24 ml      Physical Exam   General:   No resp difficulty Neck: JVP 8-9  Cor: Regular rate & rhythm.  Lungs: RLL crackles on room air.  Abdomen: soft, nontender, nondistended.  Extremities: no  edema Neuro: alert & oriented x3  Telemetry   SR with PVCs and NSVT  EKG      Labs   Basic Metabolic Panel: Recent Labs  Lab 11/19/23 1136 11/20/23 0540  NA 129* 128*  K 5.2* 4.5  CL 93* 94*  CO2 22 23  GLUCOSE 168* 158*  BUN 31* 39*  CREATININE 1.68* 1.55*  CALCIUM  9.3 8.7*    Liver Function Tests: Recent Labs  Lab 11/19/23 1758 11/20/23 0540  AST 35 47*  ALT 14 21  ALKPHOS 55 60  BILITOT 2.1* 2.6*  PROT 6.6 7.0  ALBUMIN 2.9* 3.1*   No results for input(s): LIPASE, AMYLASE in the last 168 hours. No results for input(s): AMMONIA in the last 168 hours.  CBC: Recent Labs  Lab 11/19/23 1136 11/20/23 0540  WBC 21.7* 21.0*  HGB 13.9 13.0  HCT 42.9 39.4  MCV 85.3 82.3  PLT 162 261    Cardiac Enzymes: No results for input(s): CKTOTAL, CKMB, CKMBINDEX, TROPONINI in the last 168 hours.  BNP: BNP (last 3 results) Recent Labs    03/21/23 1602 05/27/23 0908 11/19/23 1136  BNP 532.5* >4,500.0* >4,500.0*    ProBNP (last 3 results) No results for input(s): PROBNP in the last 8760 hours.  CBG: No results for input(s): GLUCAP in the last 168 hours.  Coagulation Studies: No  results for input(s): LABPROT, INR in the last 72 hours.   Imaging   ECHOCARDIOGRAM COMPLETE Result Date: 11/20/2023    ECHOCARDIOGRAM REPORT   Patient Name:   OMRI BERTRAN Date of Exam: 11/20/2023 Medical Rec #:  [id number]     Height:       69.0 in Accession #:    [id number]    Weight:       191.4 lb Date of Birth:  1955-06-24    BSA:          2.028 m Patient Age:    71 years      BP:           112/73 mmHg Patient Gender: M             HR:           103 bpm. Exam Location:  [facility] Procedure: 2D Echo, 3D Echo, Cardiac Doppler, Color Doppler and Intracardiac            Opacification Agent (Both Spectral and Color Flow Doppler were            utilized during procedure). Indications:     CHF I50.21  History:         Patient has prior history of Echocardiogram examinations, most                  recent 02/09/2022.  Sonographer:     Thedora Louder RDCS, FASE Referring Phys:  [id number] DAVID DORN ORTIZ Diagnosing Phys: Redell Cave MD IMPRESSIONS  1. Left ventricular ejection fraction, by estimation, is <20%. The left ventricle has severely decreased function. The left ventricle demonstrates global hypokinesis. The left ventricular internal cavity size was severely dilated. Left ventricular diastolic parameters are consistent with Grade II diastolic dysfunction (pseudonormalization).  2. Right ventricular systolic function is moderately reduced. The right ventricular size is normal. There is severely elevated pulmonary artery systolic pressure.  3. Left atrial size was severely dilated.  4. The mitral valve is normal in structure. Mild to moderate mitral valve regurgitation.  5. The aortic valve is tricuspid. Aortic valve regurgitation is mild.  6. The inferior vena cava is normal in size with <50% respiratory variability, suggesting right atrial pressure of 8 mmHg. FINDINGS  Left Ventricle: Left ventricular ejection fraction, by estimation, is <20%. The left ventricle has severely decreased function. The  left ventricle demonstrates global hypokinesis. Definity  contrast agent was given IV to delineate the left ventricular endocardial borders. The left ventricular internal cavity size was severely dilated. There is no left ventricular hypertrophy. Left ventricular diastolic parameters are consistent with Grade II diastolic dysfunction (pseudonormalization). Right Ventricle: The right ventricular size is normal. No increase in right ventricular wall thickness. Right ventricular systolic function is moderately reduced. There is severely elevated pulmonary artery systolic pressure. The tricuspid regurgitant velocity is 3.83 m/s, and with an assumed right atrial pressure of 8 mmHg, the estimated right ventricular systolic pressure is 66.7 mmHg. Left Atrium: Left atrial size was severely dilated. Right Atrium: Right atrial size was normal in size. Pericardium: Trivial pericardial effusion is present. Mitral Valve: The mitral valve is normal in structure. Mild to moderate mitral valve regurgitation. MV peak gradient, 6.9 mmHg. The mean mitral valve gradient is 3.0 mmHg. Tricuspid Valve: The tricuspid valve is normal in structure. Tricuspid valve regurgitation is mild. Aortic Valve: The aortic valve is tricuspid. Aortic valve regurgitation is  mild. Aortic regurgitation PHT measures 505 msec. Aortic valve peak gradient measures 5.2 mmHg. Pulmonic Valve: The pulmonic valve was normal in structure. Pulmonic valve regurgitation is mild. Aorta: The aortic root and ascending aorta are structurally normal, with no evidence of dilitation. Venous: The inferior vena cava is normal in size with less than 50% respiratory variability, suggesting right atrial pressure of 8 mmHg. IAS/Shunts: No atrial level shunt detected by color flow Doppler. Additional Comments: A device lead is visualized.  LEFT VENTRICLE PLAX 2D LVIDd:         7.95 cm      Diastology LVIDs:         7.20 cm      LV e' medial:    4.90 cm/s LV PW:         0.95 cm      LV  E/e' medial:  18.5 LV IVS:        0.90 cm      LV e' lateral:   5.55 cm/s LVOT diam:     1.80 cm      LV E/e' lateral: 16.3 LV SV:         25 LV SV Index:   12 LVOT Area:     2.54 cm                              3D Volume EF: LV Volumes (MOD)            3D EF:        25 % LV vol d, MOD A2C: 365.0 ml LV EDV:       415 ml LV vol d, MOD A4C: 368.5 ml LV ESV:       311 ml LV vol s, MOD A2C: 301.5 ml LV SV:        105 ml LV vol s, MOD A4C: 282.0 ml LV SV MOD A2C:     63.5 ml LV SV MOD A4C:     368.5 ml LV SV MOD BP:      77.9 ml RIGHT VENTRICLE RV Basal diam:  4.00 cm RV S prime:     10.30 cm/s TAPSE (M-mode): 1.6 cm LEFT ATRIUM            Index        RIGHT ATRIUM           Index LA diam:      5.00 cm  2.47 cm/m   RA Area:     20.00 cm LA Vol (A2C): 96.0 ml  47.35 ml/m  RA Volume:   59.10 ml  29.15 ml/m LA Vol (A4C): 110.0 ml 54.25 ml/m  AORTIC VALVE                 PULMONIC VALVE AV Area (Vmax): 1.71 cm     PV Vmax:          0.92 m/s AV Vmax:        114.00 cm/s  PV Peak grad:     3.4 mmHg AV Peak Grad:   5.2 mmHg     PR End Diast Vel: 17.47 msec LVOT Vmax:      76.60 cm/s   RVOT Peak grad:   1 mmHg LVOT Vmean:     53.100 cm/s LVOT VTI:       0.098 m AI PHT:         505 msec  AORTA  PULMONARY ARTERY Ao Root diam: 3.40 cm        MPA diam:        2.65 cm Ao Asc diam:  3.10 cm MITRAL VALVE                  TRICUSPID VALVE MV Area (PHT): 5.27 cm       TR Peak grad:   58.7 mmHg MV Area VTI:   0.90 cm       TR Vmax:        383.00 cm/s MV Peak grad:  6.9 mmHg MV Mean grad:  3.0 mmHg       SHUNTS MV Vmax:       1.31 m/s       Systemic VTI:  0.10 m MV Vmean:      83.5 cm/s      Systemic Diam: 1.80 cm MV Decel Time: 144 msec Jerry Peak grad:    63.2 mmHg Jerry Mean grad:    40.5 mmHg Jerry Vmax:         397.50 cm/s Jerry Vmean:        300.0 cm/s Jerry PISA:         1.01 cm Jerry PISA Eff ROA: 8 mm Jerry PISA Radius:  0.40 cm MV E velocity: 90.70 cm/s Redell Cave MD Electronically signed by Redell Cave MD  Signature Date/Time: 11/20/2023/10:31:55 AM    Final      Medications:     Current Medications:  amiodarone   200 mg Oral Daily   apixaban   5 mg Oral BID   Chlorhexidine  Gluconate Cloth  6 each Topical Daily   cyanocobalamin   2,000 mcg Oral Daily   doxycycline   100 mg Oral Q12H   empagliflozin   10 mg Oral Daily   fluticasone   1 spray Each Nare Daily   naphazoline-glycerin   1 drop Both Eyes BID   pantoprazole   40 mg Oral Daily   pyridoxine   500 mg Oral Daily   rosuvastatin   10 mg Oral Daily   spironolactone   12.5 mg Oral Daily   tamsulosin   0.4 mg Oral QPC supper   torsemide   20 mg Oral Daily    Infusions:  cefTRIAXone  (ROCEPHIN )  IV Stopped (11/20/23 1325)   iron  sucrose        Patient Profile   Jerry Groot is a 68 year old with a history of chronic HFrEF, single chamber ICD, NICM, PVCs, PAF, A fib Ablation 2024, and CKD Stage IIIb.   Admitted with sepsis/pneumonia.   Assessment/Plan   1. Sepsis --> Pneumonia  Procalcitonin 20 on admit. WBC elevated on admit and now coming down  Bld Cx- NGTD. Watch closely given ICD>  Sputum CX- few gram positive cocci & gram negative rods.  Treated with antibiotics per primary team.   2. A/C Biventricular HFrEF  Echo this admit LV < 20 % RV moderately reduced. Similar to previous ECHO.  Has ICD-As above blood cultures NGTD.  NYHA II. Volume status trending up. I think we need to start back torsemide  20 mg daily.  - Hold off on bb and ARB with soft BP.  Continue jardiance  10 mg daily  Hold spiro.   3. PVCs/NSVT Frequent PVCs/NSVT. Increase amio 200 mg twice a day   4. PAF Maintaining SR.  On eliquis  5 mg twice a day   5. CKD Stage IIIb Creatinine baseline ~ 1.7   Mobilize today.   Length of Stay: 2  Greig Mosses, NP  11/21/2023, 7:34 AM  Advanced Heart Failure Team Pager [phone] (M-F; 7a - 5p)  Please contact [facility] for night-coverage after hours (4p -7a ) and weekends on [email or web address]  Patient seen with NP, I  formulated the plan and agree with the above note .  68 y.o. with known nonischemic cardiomyopathy, echo this admission with EF < 20%, severe LV dilation, moderate RV dysfunction, mild-moderate Jerry.  He has a [employer] ICD.  H/o PAF s/p ablation, CKD stage 3, frequent PVCs.   He was admitted with dyspnea, PCT 20, WBCs elevated with fever 101, and infiltrate right base on CXR.  Thought to have PNA.  BP low so BP-active cardiac meds held. Creatinine 1.4, baseline around 1.7. He has been started on ceftriaxone /doxy.  General: NAD Neck: JVP 8 cm, no thyromegaly or thyroid  nodule.  Lungs: Decreased BS right base.  CV: Nondisplaced PMI.  Heart regular S1/S2, no S3/S4, no murmur.  No peripheral edema.  No carotid bruit.  Normal pedal pulses.  Abdomen: Soft, nontender, no hepatosplenomegaly, no distention.  Skin: Intact without lesions or rashes.  Neurologic: Alert and oriented x 3.  Psych: Normal affect. Extremities: No clubbing or cyanosis.  HEENT: Normal.   Suspect community-acquired PNA, treating per primary team with ceftriaxone /doxycycline .   SBP 90s-100s, creatinine stable.  Mild volume overload.  - Restart him on torsemide  20 mg daily.  - Can continue Jardiance . - Hold spironolactone , beta blocker, ARB for now.  Add back GDMT as BP allows.   He remains on amiodarone  and apixaban  for PAF, he is in NSR with frequent PVCs.   With frequent PVCs and NSVT, increased amiodarone  to 200 mg bid for now, decrease to every day at discharge.   Ezra Shuck 11/21/2023

## 2023-11-22 DIAGNOSIS — A419 Sepsis, unspecified organism: Secondary | ICD-10-CM | POA: Diagnosis not present

## 2023-11-22 DIAGNOSIS — J189 Pneumonia, unspecified organism: Secondary | ICD-10-CM | POA: Diagnosis not present

## 2023-11-22 DIAGNOSIS — I959 Hypotension, unspecified: Secondary | ICD-10-CM | POA: Diagnosis not present

## 2023-11-22 DIAGNOSIS — I5082 Biventricular heart failure: Secondary | ICD-10-CM | POA: Diagnosis not present

## 2023-11-22 DIAGNOSIS — I493 Ventricular premature depolarization: Secondary | ICD-10-CM

## 2023-11-22 DIAGNOSIS — I5022 Chronic systolic (congestive) heart failure: Secondary | ICD-10-CM | POA: Diagnosis not present

## 2023-11-22 MED ORDER — AMIODARONE HCL 200 MG PO TABS
200.0000 mg | ORAL_TABLET | Freq: Every day | ORAL | Status: DC
  Administered 2023-11-23: 200 mg via ORAL
  Filled 2023-11-22: qty 1

## 2023-11-22 NOTE — TOC CM/SW Note (Signed)
 Transition of Care [facility]) - Inpatient Brief Assessment   Patient Details  Name: Jerry Howe MRN: [id number] Date of Birth: [birth year]  Transition of Care [facility]) CM/SW Contact:    Lauraine JAYSON Carpen, LCSW Phone Number: 11/22/2023, 2:19 PM   Clinical Narrative: Chart reviewed. No TOC needs identified so far. CSW will continue to follow progress. Please place [facility] consult if any needs arise.  Transition of Care Asessment: Insurance and Status: Insurance coverage has been reviewed Patient has primary care physician: Yes Home environment has been reviewed: Single family home Prior level of function:: Not documented Prior/Current Home Services: No current home services Social Drivers of Health Review: SDOH reviewed no interventions necessary Readmission risk has been reviewed: Yes Transition of care needs: no transition of care needs at this time

## 2023-11-22 NOTE — Plan of Care (Signed)
  Problem: Education: Goal: Knowledge of General Education information will improve Description: Including pain rating scale, medication(s)/side effects and non-pharmacologic comfort measures Outcome: Progressing   Problem: Health Behavior/Discharge Planning: Goal: Ability to manage health-related needs will improve Outcome: Progressing   Problem: Clinical Measurements: Goal: Ability to maintain clinical measurements within normal limits will improve Outcome: Progressing   Problem: Clinical Measurements: Goal: Diagnostic test results will improve Outcome: Progressing   Problem: Nutrition: Goal: Adequate nutrition will be maintained Outcome: Progressing

## 2023-11-22 NOTE — Progress Notes (Signed)
 Progress Note   Patient: Jerry Howe FMW:[id number] DOB: 05/05/1955 DOA: 11/19/2023     3 DOS: the patient was seen and examined on 11/22/2023   Brief hospital course: 68 y.o. male with medical history significant of anxiety, unspecified V. tach, elevated troponin, CAD, nonischemic cardiomyopathy, ICD placement, history of V-fib on ICD interrogation, paroxysmal atrial fibrillation, chronic systolic heart failure, hypertension, hyperlipidemia, hypervitaminosis D, nephrolithiasis, stage IIIa CKD, history of COVID-19, elevated free T4 and low TSH level who presented to the emergency department with complaints of dyspnea, productive cough of clear sputum for the past 3 days.  He recently ate a burrito that felt salty to the taste. He denied fever, chills, rhinorrhea, sore throat, wheezing or hemoptysis.  No chest pain, palpitations, diaphoresis, PND, but has been having orthopnea and pitting edema of the lower extremities.  No abdominal pain, nausea, emesis, diarrhea, constipation, melena or hematochezia.  No flank pain, dysuria, frequency or hematuria.  No polyuria, polydipsia, polyphagia or blurred vision.    Lab work: Coronavirus, influenza and RSV PCR test was negative.  CBC showed a white count of 21.7, hemoglobin 13.9 g/dL and platelets 837.  BNP was greater than 4500 pg/mL.  BMP showed sodium 129 (corrected 131), potassium 5.2, chloride 93 and CO2 22 mmol/L with a normal anion gap.  Calcium  9.3, glucose 168, BUN 31 and creatinine 1.68 mg/dL.  Lactic acid was normal.  Procalcitonin was 20.26 ng/mL.   Imaging: 2 view chest radiograph showing right basilar opacity concerning for pneumonia.  Follow-up recommended in 4 to 6 weeks.  There was also stable cardiomegaly.   ED course: Initial vital signs were temperature 101 F, pulse 108, respiration 20, BP 136/93 mmHg and O2 sat 97% on room air.  The patient received 650 mg p.o. of acetaminophen , ceftriaxone  2 g IVPB, doxycycline  100 mg p.o., Toradol  15 mg  IVP and I added Lokelma  10 g p.o. x 1.  After arriving to the stepdown area he received midodrine  10 mg p.o. x 1.  8/3.  Patient's blood pressure improved from yesterday.  Will restart spironolactone  at lower dose.  Continue Rocephin  and doxycycline  with fever and pneumonia. 8/4.  Ferritin elevated at 662 and hemoglobin 13.3.  No need for IV iron .  Patient's blood pressure low overnight.  Held medications.  White blood cell count trending down to 15.2.    Assessment and Plan: * Sepsis due to pneumonia [facility]) Present on admission with fever tachycardia tachypnea and lobar pneumonia right base.  Patient on doxycycline  and Rocephin .  Sputum culture growing few gram-positive cocci, few gram-negative rods and rare gram-positive rods.  So far blood cultures negative for 3 days.  Hypotension Blood pressure on the lower side.  Holding Entresto , Toprol , spironolactone .  Patient on Jardiance  and torsemide .  Patient asymptomatic with low blood pressure.  Chronic HFrEF (heart failure with reduced ejection fraction) (HCC) Continue Jardiance  and torsemide .  Blood pressure too low for Entresto , Toprol  and spironolactone  at this time.  Appreciate heart failure team consultation.  NICM (nonischemic cardiomyopathy) (HCC) EF less than 20%.  Blood pressure too low for most heart failure medications at this point  PAF (paroxysmal atrial fibrillation) (HCC) Patient on Eliquis  for anticoagulation.  CKD stage 3a, GFR 45-59 ml/min (HCC) Creatinine 1.41 with a GFR of 55  Hyperkalemia Improved  Hyponatremia Looks like this is chronic for this patient likely secondary to heart failure.  Gastroesophageal reflux disease without esophagitis On PPI  BPH (benign prostatic hyperplasia) On Flomax   Subjective: Patient feels okay.  States he is coughing less.  Blood pressure still low.  Not having symptoms while sitting up eating breakfast.  Physical Exam: Vitals:   11/22/23 0411 11/22/23 0500  11/22/23 0900 11/22/23 1127  BP: (!) 88/61  (!) 86/60 (!) 87/74  Pulse: 61  77 75  Resp: 19     Temp: 97.6 F (36.4 C)  98.1 F (36.7 C) 97.8 F (36.6 C)  TempSrc:   Oral Oral  SpO2: 99%  98% 98%  Weight:  82.1 kg    Height:       Physical Exam HENT:     Head: Normocephalic.  Eyes:     General: Lids are normal.  Cardiovascular:     Rate and Rhythm: Normal rate and regular rhythm.     Heart sounds: Normal heart sounds, S1 normal and S2 normal.  Pulmonary:     Breath sounds: Examination of the right-lower field reveals decreased breath sounds. Examination of the left-lower field reveals decreased breath sounds. Decreased breath sounds present. No wheezing, rhonchi or rales.  Abdominal:     Palpations: Abdomen is soft.     Tenderness: There is no abdominal tenderness.  Musculoskeletal:     Right lower leg: No swelling.     Left lower leg: No swelling.  Skin:    General: Skin is warm.     Findings: No rash.  Neurological:     Mental Status: He is alert and oriented to person, place, and time.     Data Reviewed: Cr 1.41 GFR 55, white blood cell count 15.2  Family Communication: update wife on the phone  Disposition: Status is: Inpatient Remains inpatient appropriate because: BP low and continue iv antibiotics  Planned Discharge Destination: Home    Time spent: 27 minutes  Author: Charlie Patterson, MD 11/22/2023 12:06 PM  For on call review [email or web address].

## 2023-11-22 NOTE — Progress Notes (Signed)
 Advanced Heart Failure Rounding Note  Cardiologist: Evalene Lunger, MD  Chief Complaint: acute on chronic systolic heart failure Subjective:    Feeling much better, has not yet been walking around much.    Objective:   Weight Range: 82.1 kg Body mass index is 26.73 kg/m.   Vital Signs:   Temp:  [97.6 F (36.4 C)-98.5 F (36.9 C)] 97.8 F (36.6 C) (08/05 1127) Pulse Rate:  [61-80] 75 (08/05 1127) Resp:  [18-27] 19 (08/05 0411) BP: (83-90)/(51-74) 87/74 (08/05 1127) SpO2:  [96 %-100 %] 98 % (08/05 1127) Weight:  [82.1 kg] 82.1 kg (08/05 0500) Last BM Date : 11/22/23 (Per 3rd shift report)  Weight change: Filed Weights   11/20/23 0500 11/21/23 0400 11/22/23 0500  Weight: 86.8 kg 86.5 kg 82.1 kg    Intake/Output:   Intake/Output Summary (Last 24 hours) at 11/22/2023 1454 Last data filed at 11/22/2023 1129 Gross per 24 hour  Intake 720 ml  Output 2300 ml  Net -1580 ml      Physical Exam    GENERAL: NAD, well appearing PULM:  Normal work of breathing, CTAB CARDIAC:  JVP: mildly elevated         Normal rate with regular rhythm. No murmurs, rubs or gallops.  trace edema. Warm and well perfused extremities. ABDOMEN: Soft, non-tender, non-distended. NEUROLOGIC: Patient is oriented x3 with no focal or lateralizing neurologic deficits.      Medications:     Scheduled Medications:  amiodarone   200 mg Oral BID   apixaban   5 mg Oral BID   Chlorhexidine  Gluconate Cloth  6 each Topical Daily   cyanocobalamin   2,000 mcg Oral Daily   doxycycline   100 mg Oral Q12H   empagliflozin   10 mg Oral Daily   fluticasone   1 spray Each Nare Daily   naphazoline-glycerin   1 drop Both Eyes BID   pantoprazole   40 mg Oral Daily   polyethylene glycol  17 g Oral Daily   pyridoxine   500 mg Oral Daily   rosuvastatin   10 mg Oral Daily   tamsulosin   0.4 mg Oral QPC supper   torsemide   20 mg Oral Daily    Infusions:  cefTRIAXone  (ROCEPHIN )  IV 2 g (11/22/23 1316)    PRN  Medications: acetaminophen  **OR** acetaminophen , albuterol , guaiFENesin -dextromethorphan , melatonin, ondansetron  **OR** ondansetron  (ZOFRAN ) IV, mouth rinse    Patient Profile   Mr Letizia is a 68 year old with a history of chronic HFrEF, single chamber ICD, NICM, PVCs, PAF, A fib Ablation 2024, and CKD Stage IIIb.    Admitted with sepsis/pneumonia.     Assessment/Plan    Sepsis due to PNA: Bcx NGTD, WBC trending down as well. - Continue doxycycline  and CTX - Treatment per primary  Acute on chronic biventricular heart failure: Echo similar to prior, biventricular involvement. BP low with ongoing infection. - Continue torsemide  20mg  daily - Continue jardiance  10mg  daily - SBP still in the 80s, hold spironolactone  at least another day - If mobile tomorrow may be able to discharge from a HF stanpdoint. Final med recs dependent on BP  PVCs/NSVT Frequent PVCs/NSVT. Fine to continue 200mg  BID while in the hospital, suspect will improve once infection is treated - Transition back to 200mg  daily tomorrow   PAF Maintaining SR.  On eliquis  5 mg twice a day    CKD Stage IIIb Creatinine baseline ~ 1.7, stable  Medication concerns reviewed with patient and pharmacy team. Barriers identified: None  Length of Stay: 3  Morene PARAS  Zenaida, MD  11/22/2023, 2:54 PM  Advanced Heart Failure Team Pager [phone] (M-F; 7a - 5p)  Please contact [facility] for night-coverage after hours (5p -7a ) and weekends on [email or web address]

## 2023-11-23 ENCOUNTER — Encounter

## 2023-11-23 ENCOUNTER — Ambulatory Visit

## 2023-11-23 DIAGNOSIS — I5022 Chronic systolic (congestive) heart failure: Secondary | ICD-10-CM | POA: Diagnosis not present

## 2023-11-23 DIAGNOSIS — I480 Paroxysmal atrial fibrillation: Secondary | ICD-10-CM | POA: Diagnosis not present

## 2023-11-23 DIAGNOSIS — I471 Supraventricular tachycardia, unspecified: Secondary | ICD-10-CM | POA: Diagnosis not present

## 2023-11-23 DIAGNOSIS — A419 Sepsis, unspecified organism: Secondary | ICD-10-CM | POA: Diagnosis not present

## 2023-11-23 DIAGNOSIS — I5082 Biventricular heart failure: Secondary | ICD-10-CM | POA: Diagnosis not present

## 2023-11-23 DIAGNOSIS — I9589 Other hypotension: Secondary | ICD-10-CM | POA: Diagnosis not present

## 2023-11-23 DIAGNOSIS — J189 Pneumonia, unspecified organism: Secondary | ICD-10-CM | POA: Diagnosis not present

## 2023-11-23 LAB — CBC
HCT: 38.2 % — ABNORMAL LOW (ref 39.0–52.0)
Hemoglobin: 12.4 g/dL — ABNORMAL LOW (ref 13.0–17.0)
MCH: 26.7 pg (ref 26.0–34.0)
MCHC: 32.5 g/dL (ref 30.0–36.0)
MCV: 82.3 fL (ref 80.0–100.0)
Platelets: 271 K/uL (ref 150–400)
RBC: 4.64 MIL/uL (ref 4.22–5.81)
RDW: 14.3 % (ref 11.5–15.5)
WBC: 7.2 K/uL (ref 4.0–10.5)
nRBC: 0 % (ref 0.0–0.2)

## 2023-11-23 LAB — BASIC METABOLIC PANEL WITH GFR
Anion gap: 9 (ref 5–15)
BUN: 34 mg/dL — ABNORMAL HIGH (ref 8–23)
CO2: 28 mmol/L (ref 22–32)
Calcium: 8.8 mg/dL — ABNORMAL LOW (ref 8.9–10.3)
Chloride: 95 mmol/L — ABNORMAL LOW (ref 98–111)
Creatinine, Ser: 1.3 mg/dL — ABNORMAL HIGH (ref 0.61–1.24)
GFR, Estimated: >60 mL/min (ref >60)
Glucose, Bld: 110 mg/dL — ABNORMAL HIGH (ref 70–99)
Potassium: 3.7 mmol/L (ref 3.5–5.1)
Sodium: 132 mmol/L — ABNORMAL LOW (ref 135–145)

## 2023-11-23 LAB — CULTURE, RESPIRATORY W GRAM STAIN
Culture: NORMAL
Gram Stain: NONE SEEN

## 2023-11-23 LAB — GLUCOSE, CAPILLARY: Glucose-Capillary: 162 mg/dL — ABNORMAL HIGH (ref 70–99)

## 2023-11-23 LAB — PROCALCITONIN: Procalcitonin: 14.74 ng/mL

## 2023-11-23 MED ORDER — AMOXICILLIN-POT CLAVULANATE 500-125 MG PO TABS: 1.0000 | ORAL_TABLET | Freq: Two times a day (BID) | ORAL | 0 refills | Status: AC

## 2023-11-23 MED ORDER — DOXYCYCLINE HYCLATE 100 MG PO TABS: 100.0000 mg | ORAL_TABLET | Freq: Two times a day (BID) | ORAL | 0 refills | Status: AC

## 2023-11-23 NOTE — Discharge Summary (Signed)
 Physician Discharge Summary   Patient: Jerry Howe MRN: [id number] DOB: June 07, 1955  Admit date:     11/19/2023  Discharge date: 11/23/23  Discharge Physician: Charlie Patterson   PCP: Sampson Ethridge LABOR, MD   Recommendations at discharge:   Follow-up PCP 5 days Follow-up heart failure clinic Weight yourself on a daily basis to be continued 3 pounds in a day or 5 pounds in a week call the heart failure clinic for recommendations.  Discharge Diagnoses: Principal Problem:   Sepsis due to pneumonia [facility]) Active Problems:   Hypotension   Chronic HFrEF (heart failure with reduced ejection fraction) (HCC)   NICM (nonischemic cardiomyopathy) (HCC)   PAF (paroxysmal atrial fibrillation) (HCC)   CKD stage 3a, GFR 45-59 ml/min (HCC)   Hyperkalemia   Hyponatremia   Gastroesophageal reflux disease without esophagitis   BPH (benign prostatic hyperplasia)    Hospital Course: 68 y.o. male with medical history significant of anxiety, unspecified V. tach, elevated troponin, CAD, nonischemic cardiomyopathy, ICD placement, history of V-fib on ICD interrogation, paroxysmal atrial fibrillation, chronic systolic heart failure, hypertension, hyperlipidemia, hypervitaminosis D, nephrolithiasis, stage IIIa CKD, history of COVID-19, elevated free T4 and low TSH level who presented to the emergency department with complaints of dyspnea, productive cough of clear sputum for the past 3 days.  He recently ate a burrito that felt salty to the taste. He denied fever, chills, rhinorrhea, sore throat, wheezing or hemoptysis.  No chest pain, palpitations, diaphoresis, PND, but has been having orthopnea and pitting edema of the lower extremities.  No abdominal pain, nausea, emesis, diarrhea, constipation, melena or hematochezia.  No flank pain, dysuria, frequency or hematuria.  No polyuria, polydipsia, polyphagia or blurred vision.    Lab work: Coronavirus, influenza and RSV PCR test was negative.  CBC showed a  white count of 21.7, hemoglobin 13.9 g/dL and platelets 837.  BNP was greater than 4500 pg/mL.  BMP showed sodium 129 (corrected 131), potassium 5.2, chloride 93 and CO2 22 mmol/L with a normal anion gap.  Calcium  9.3, glucose 168, BUN 31 and creatinine 1.68 mg/dL.  Lactic acid was normal.  Procalcitonin was 20.26 ng/mL.   Imaging: 2 view chest radiograph showing right basilar opacity concerning for pneumonia.  Follow-up recommended in 4 to 6 weeks.  There was also stable cardiomegaly.   ED course: Initial vital signs were temperature 101 F, pulse 108, respiration 20, BP 136/93 mmHg and O2 sat 97% on room air.  The patient received 650 mg p.o. of acetaminophen , ceftriaxone  2 g IVPB, doxycycline  100 mg p.o., Toradol  15 mg IVP and I added Lokelma  10 g p.o. x 1.  After arriving to the stepdown area he received midodrine  10 mg p.o. x 1.  8/3.  Patient's blood pressure improved from yesterday.  Will restart spironolactone  at lower dose.  Continue Rocephin  and doxycycline  with fever and pneumonia. 8/4.  Ferritin elevated at 662 and hemoglobin 13.3.  No need for IV iron .  Patient's blood pressure low overnight.  Held medications.  White blood cell count trending down to 15.2. 8/5.  Patient has blood pressure still low.  Feeling better with regards to pneumonia. 8/6.  Patient has blood pressure still low but asymptomatic.  Will have to go home without cardiac meds for heart failure except for torsemide  and Jardiance .  Switch Rocephin  over to Augmentin  for tomorrow and continue doxycycline .    Assessment and Plan: * Sepsis due to pneumonia [facility]) Present on admission with fever tachycardia tachypnea and lobar pneumonia right base.  Patient on doxycycline  and Rocephin .  Sputum culture growing few gram-positive cocci, few gram-negative rods and rare gram-positive rods.  So far blood cultures negative for 4 days.  Will give Rocephin  today and switch over to Augmentin  for tomorrow.  Continue  doxycycline .  Hypotension Blood pressure on the lower side.  Holding Entresto , Toprol , spironolactone .  Patient on Jardiance  and torsemide .  Patient asymptomatic with low blood pressure.  Case discussed with heart failure team and they are okay with discharge home and following up as outpatient since patient is asymptomatic with walking around..  Chronic HFrEF (heart failure with reduced ejection fraction) (HCC) Continue Jardiance  and torsemide .  Blood pressure too low for Entresto , Toprol  and spironolactone  at this time.  Appreciate heart failure team consultation.  NICM (nonischemic cardiomyopathy) (HCC) EF less than 20%.  Blood pressure too low for most heart failure medications at this point  PAF (paroxysmal atrial fibrillation) (HCC) Patient on Eliquis  for anticoagulation.  CKD stage 3a, GFR 45-59 ml/min (HCC) Creatinine 1.3 with a GFR greater than 60  Hyperkalemia Improved  Hyponatremia Looks like this is chronic for this patient likely secondary to heart failure.  Gastroesophageal reflux disease without esophagitis On PPI  BPH (benign prostatic hyperplasia) Patient does not take Flomax  at home will discontinue.         Consultants: Heart failure team Procedures performed: None Disposition: Home Diet recommendation:  Cardiac diet DISCHARGE MEDICATION: Allergies as of 11/23/2023       Reactions   Shellfish Allergy Anaphylaxis   Shellfish-derived Products Anaphylaxis   Rosuvastatin     Dizziness         Medication List     STOP taking these medications    metoprolol  succinate 25 MG 24 hr tablet Commonly known as: TOPROL -XL   sacubitril -valsartan  49-51 MG Commonly known as: Entresto    spironolactone  25 MG tablet Commonly known as: ALDACTONE        TAKE these medications    albuterol  108 (90 Base) MCG/ACT inhaler Commonly known as: VENTOLIN  HFA Inhale 1 puff into the lungs every 6 (six) hours as needed for wheezing or shortness of breath.    amiodarone  200 MG tablet Commonly known as: PACERONE  Take 1 tablet (200 mg total) by mouth daily.   amoxicillin -clavulanate 500-125 MG tablet Commonly known as: Augmentin  Take 1 tablet by mouth 2 (two) times daily for 3 days. Start taking on: November 24, 2023   apixaban  5 MG Tabs tablet Commonly known as: ELIQUIS  Take 1 tablet (5 mg total) by mouth 2 (two) times daily.   Cholecalciferol 125 MCG (5000 UT) Chew 1 capsule.   cyanocobalamin  2000 MCG tablet Take 2,000 mcg by mouth daily.   doxycycline  100 MG tablet Commonly known as: VIBRA -TABS Take 1 tablet (100 mg total) by mouth every 12 (twelve) hours for 4 days.   empagliflozin  10 MG Tabs tablet Commonly known as: JARDIANCE  Take 1 tablet (10 mg total) by mouth daily.   fluticasone  50 MCG/ACT nasal spray Commonly known as: FLONASE  Place 1 spray into both nostrils in the morning and at bedtime. What changed: Another medication with the same name was removed. Continue taking this medication, and follow the directions you see here.   omeprazole 20 MG capsule Commonly known as: PRILOSEC Take 20 mg by mouth daily. What changed: Another medication with the same name was removed. Continue taking this medication, and follow the directions you see here.   pyridoxine  500 MG tablet Commonly known as: B-6 Take 500 mg by mouth daily.   REDNESS  RELIEF OP Place 1 drop into both eyes 2 (two) times daily.   rosuvastatin  10 MG tablet Commonly known as: CRESTOR  Take 1 tablet (10 mg total) by mouth daily.   SUPER THERA VITE M PO Take 1 tablet by mouth daily.   torsemide  20 MG tablet Commonly known as: DEMADEX  Take 1 tablet (20 mg total) by mouth daily as needed.   vitamin C  1000 MG tablet Take 1,000 mg by mouth daily. What changed: Another medication with the same name was removed. Continue taking this medication, and follow the directions you see here.   Vitamin E 180 MG (400 UNIT) Caps 1 capsule.   Zinc Gluconate 100 MG  Tabs 1 tablet Orally Once a day for health maintenance        Follow-up Information     [facility] Follow up on 11/30/2023.   Specialty: Cardiology Why: at 3:45 Contact information: [address] [place]  [address] [phone]        Sherial Bail, MD Follow up.   Specialty: Internal Medicine Why: new patient hospital follow up Contact information: [address] [place] [address] [phone]                Discharge Exam: Filed Weights   11/21/23 0400 11/22/23 0500 11/23/23 0500  Weight: 86.5 kg 82.1 kg 85.8 kg   Physical Exam HENT:     Head: Normocephalic.  Eyes:     General: Lids are normal.  Cardiovascular:     Rate and Rhythm: Normal rate and regular rhythm.     Heart sounds: Normal heart sounds, S1 normal and S2 normal.  Pulmonary:     Breath sounds: Examination of the right-lower field reveals decreased breath sounds. Examination of the left-lower field reveals decreased breath sounds. Decreased breath sounds present. No wheezing, rhonchi or rales.  Abdominal:     Palpations: Abdomen is soft.     Tenderness: There is no abdominal tenderness.  Musculoskeletal:     Right lower leg: No swelling.     Left lower leg: No swelling.  Skin:    General: Skin is warm.     Findings: No rash.  Neurological:     Mental Status: He is alert and oriented to person, place, and time.      Condition at discharge: stable  The results of significant diagnostics from this hospitalization (including imaging, microbiology, ancillary and laboratory) are listed below for reference.   Imaging Studies: ECHOCARDIOGRAM COMPLETE Result Date: 11/20/2023    ECHOCARDIOGRAM REPORT   Patient Name:   ARMARION GREEK Date of Exam: 11/20/2023 Medical Rec #:  [id number]     Height:       69.0 in Accession #:    [id number]    Weight:       191.4 lb Date of Birth:  [birth year]    BSA:          2.028  m Patient Age:    68 years      BP:           112/73 mmHg Patient Gender: M             HR:           103 bpm. Exam Location:  [facility] Procedure: 2D Echo, 3D Echo, Cardiac Doppler, Color Doppler and Intracardiac            Opacification Agent (Both Spectral and Color Flow Doppler were  utilized during procedure). Indications:     CHF I50.21  History:         Patient has prior history of Echocardiogram examinations, most                  recent 02/09/2022.  Sonographer:     Thedora Louder RDCS, FASE Referring Phys:  [id number] DAVID DORN ORTIZ Diagnosing Phys: Redell Cave MD IMPRESSIONS  1. Left ventricular ejection fraction, by estimation, is <20%. The left ventricle has severely decreased function. The left ventricle demonstrates global hypokinesis. The left ventricular internal cavity size was severely dilated. Left ventricular diastolic parameters are consistent with Grade II diastolic dysfunction (pseudonormalization).  2. Right ventricular systolic function is moderately reduced. The right ventricular size is normal. There is severely elevated pulmonary artery systolic pressure.  3. Left atrial size was severely dilated.  4. The mitral valve is normal in structure. Mild to moderate mitral valve regurgitation.  5. The aortic valve is tricuspid. Aortic valve regurgitation is mild.  6. The inferior vena cava is normal in size with <50% respiratory variability, suggesting right atrial pressure of 8 mmHg. FINDINGS  Left Ventricle: Left ventricular ejection fraction, by estimation, is <20%. The left ventricle has severely decreased function. The left ventricle demonstrates global hypokinesis. Definity  contrast agent was given IV to delineate the left ventricular endocardial borders. The left ventricular internal cavity size was severely dilated. There is no left ventricular hypertrophy. Left ventricular diastolic parameters are consistent with Grade II diastolic dysfunction (pseudonormalization). Right  Ventricle: The right ventricular size is normal. No increase in right ventricular wall thickness. Right ventricular systolic function is moderately reduced. There is severely elevated pulmonary artery systolic pressure. The tricuspid regurgitant velocity is 3.83 m/s, and with an assumed right atrial pressure of 8 mmHg, the estimated right ventricular systolic pressure is 66.7 mmHg. Left Atrium: Left atrial size was severely dilated. Right Atrium: Right atrial size was normal in size. Pericardium: Trivial pericardial effusion is present. Mitral Valve: The mitral valve is normal in structure. Mild to moderate mitral valve regurgitation. MV peak gradient, 6.9 mmHg. The mean mitral valve gradient is 3.0 mmHg. Tricuspid Valve: The tricuspid valve is normal in structure. Tricuspid valve regurgitation is mild. Aortic Valve: The aortic valve is tricuspid. Aortic valve regurgitation is mild. Aortic regurgitation PHT measures 505 msec. Aortic valve peak gradient measures 5.2 mmHg. Pulmonic Valve: The pulmonic valve was normal in structure. Pulmonic valve regurgitation is mild. Aorta: The aortic root and ascending aorta are structurally normal, with no evidence of dilitation. Venous: The inferior vena cava is normal in size with less than 50% respiratory variability, suggesting right atrial pressure of 8 mmHg. IAS/Shunts: No atrial level shunt detected by color flow Doppler. Additional Comments: A device lead is visualized.  LEFT VENTRICLE PLAX 2D LVIDd:         7.95 cm      Diastology LVIDs:         7.20 cm      LV e' medial:    4.90 cm/s LV PW:         0.95 cm      LV E/e' medial:  18.5 LV IVS:        0.90 cm      LV e' lateral:   5.55 cm/s LVOT diam:     1.80 cm      LV E/e' lateral: 16.3 LV SV:         25 LV SV Index:   12 LVOT Area:  2.54 cm                              3D Volume EF: LV Volumes (MOD)            3D EF:        25 % LV vol d, MOD A2C: 365.0 ml LV EDV:       415 ml LV vol d, MOD A4C: 368.5 ml LV ESV:        311 ml LV vol s, MOD A2C: 301.5 ml LV SV:        105 ml LV vol s, MOD A4C: 282.0 ml LV SV MOD A2C:     63.5 ml LV SV MOD A4C:     368.5 ml LV SV MOD BP:      77.9 ml RIGHT VENTRICLE RV Basal diam:  4.00 cm RV S prime:     10.30 cm/s TAPSE (M-mode): 1.6 cm LEFT ATRIUM            Index        RIGHT ATRIUM           Index LA diam:      5.00 cm  2.47 cm/m   RA Area:     20.00 cm LA Vol (A2C): 96.0 ml  47.35 ml/m  RA Volume:   59.10 ml  29.15 ml/m LA Vol (A4C): 110.0 ml 54.25 ml/m  AORTIC VALVE                 PULMONIC VALVE AV Area (Vmax): 1.71 cm     PV Vmax:          0.92 m/s AV Vmax:        114.00 cm/s  PV Peak grad:     3.4 mmHg AV Peak Grad:   5.2 mmHg     PR End Diast Vel: 17.47 msec LVOT Vmax:      76.60 cm/s   RVOT Peak grad:   1 mmHg LVOT Vmean:     53.100 cm/s LVOT VTI:       0.098 m AI PHT:         505 msec  AORTA                        PULMONARY ARTERY Ao Root diam: 3.40 cm        MPA diam:        2.65 cm Ao Asc diam:  3.10 cm MITRAL VALVE                  TRICUSPID VALVE MV Area (PHT): 5.27 cm       TR Peak grad:   58.7 mmHg MV Area VTI:   0.90 cm       TR Vmax:        383.00 cm/s MV Peak grad:  6.9 mmHg MV Mean grad:  3.0 mmHg       SHUNTS MV Vmax:       1.31 m/s       Systemic VTI:  0.10 m MV Vmean:      83.5 cm/s      Systemic Diam: 1.80 cm MV Decel Time: 144 msec MR Peak grad:    63.2 mmHg MR Mean grad:    40.5 mmHg MR Vmax:         397.50 cm/s MR Vmean:  300.0 cm/s MR PISA:         1.01 cm MR PISA Eff ROA: 8 mm MR PISA Radius:  0.40 cm MV E velocity: 90.70 cm/s Redell Cave MD Electronically signed by Redell Cave MD Signature Date/Time: 11/20/2023/10:31:55 AM    Final    DG Chest 2 View Result Date: 11/19/2023 CLINICAL DATA:  Cough. EXAM: CHEST - 2 VIEW COMPARISON:  05/28/2023. FINDINGS: Stable cardiomegaly. Stable left-sided single lead pacer/AICD. Right basilar opacity is concerning for pneumonia. The left lung is clear. No pleural effusion or pneumothorax. No acute  osseous abnormality. IMPRESSION: Right basilar opacity is concerning for pneumonia. Followup PA and lateral chest X-ray is recommended in 4-6 weeks to monitor for resolution. Electronically Signed   By: Harrietta Sherry M.D.   On: 11/19/2023 11:57    Microbiology: Results for orders placed or performed during the hospital encounter of 11/19/23  Resp panel by RT-PCR (RSV, Flu A&B, Covid) Anterior Nasal Swab     Status: None   Collection Time: 11/19/23 11:36 AM   Specimen: Anterior Nasal Swab  Result Value Ref Range Status   SARS Coronavirus 2 by RT PCR NEGATIVE NEGATIVE Final    Comment: (NOTE) SARS-CoV-2 target nucleic acids are NOT DETECTED.  The SARS-CoV-2 RNA is generally detectable in upper respiratory specimens during the acute phase of infection. The lowest concentration of SARS-CoV-2 viral copies this assay can detect is 138 copies/mL. A negative result does not preclude SARS-Cov-2 infection and should not be used as the sole basis for treatment or other patient management decisions. A negative result may occur with  improper specimen collection/handling, submission of specimen other than nasopharyngeal swab, presence of viral mutation(s) within the areas targeted by this assay, and inadequate number of viral copies(<138 copies/mL). A negative result must be combined with clinical observations, patient history, and epidemiological information. The expected result is Negative.  Fact Sheet for Patients:  BloggerCourse.com  Fact Sheet for Healthcare Providers:  SeriousBroker.it  This test is no t yet approved or cleared by the [place]  FDA and  has been authorized for detection and/or diagnosis of SARS-CoV-2 by FDA under an Emergency Use Authorization (EUA). This EUA will remain  in effect (meaning this test can be used) for the duration of the COVID-19 declaration under Section 564(b)(1) of the Act, 21 U.S.C.section  360bbb-3(b)(1), unless the authorization is terminated  or revoked sooner.       Influenza A by PCR NEGATIVE NEGATIVE Final   Influenza B by PCR NEGATIVE NEGATIVE Final    Comment: (NOTE) The Xpert Xpress SARS-CoV-2/FLU/RSV plus assay is intended as an aid in the diagnosis of influenza from Nasopharyngeal swab specimens and should not be used as a sole basis for treatment. Nasal washings and aspirates are unacceptable for Xpert Xpress SARS-CoV-2/FLU/RSV testing.  Fact Sheet for Patients: BloggerCourse.com  Fact Sheet for Healthcare Providers: SeriousBroker.it  This test is not yet approved or cleared by the [place]  FDA and has been authorized for detection and/or diagnosis of SARS-CoV-2 by FDA under an Emergency Use Authorization (EUA). This EUA will remain in effect (meaning this test can be used) for the duration of the COVID-19 declaration under Section 564(b)(1) of the Act, 21 U.S.C. section 360bbb-3(b)(1), unless the authorization is terminated or revoked.     Resp Syncytial Virus by PCR NEGATIVE NEGATIVE Final    Comment: (NOTE) Fact Sheet for Patients: BloggerCourse.com  Fact Sheet for Healthcare Providers: SeriousBroker.it  This test is not yet approved or cleared by the  [place]  FDA and has been authorized for detection and/or diagnosis of SARS-CoV-2 by FDA under an Emergency Use Authorization (EUA). This EUA will remain in effect (meaning this test can be used) for the duration of the COVID-19 declaration under Section 564(b)(1) of the Act, 21 U.S.C. section 360bbb-3(b)(1), unless the authorization is terminated or revoked.  Performed at [facility], [address] [place], [place] [address]   Blood culture (routine x 2)     Status: None (Preliminary result)   Collection Time: 11/19/23 12:55 PM   Specimen: BLOOD  Result Value Ref Range  Status   Specimen Description BLOOD BLOOD RIGHT FOREARM  Final   Special Requests   Final    BOTTLES DRAWN AEROBIC AND ANAEROBIC Blood Culture results may not be optimal due to an inadequate volume of blood received in culture bottles   Culture   Final    NO GROWTH 4 DAYS Performed at [facility], [address], [place], [place] [address]    Report Status PENDING  Incomplete  Blood culture (routine x 2)     Status: None (Preliminary result)   Collection Time: 11/19/23 12:55 PM   Specimen: BLOOD  Result Value Ref Range Status   Specimen Description BLOOD RIGHT ANTECUBITAL  Final   Special Requests   Final    BOTTLES DRAWN AEROBIC AND ANAEROBIC Blood Culture adequate volume   Culture   Final    NO GROWTH 4 DAYS Performed at [facility], [address] [place], [place] [address]    Report Status PENDING  Incomplete  MRSA Next Gen by PCR, Nasal     Status: None   Collection Time: 11/19/23  5:10 PM   Specimen: Nasal Mucosa; Nasal Swab  Result Value Ref Range Status   MRSA by PCR Next Gen NOT DETECTED NOT DETECTED Final    Comment: (NOTE) The GeneXpert MRSA Assay (FDA approved for NASAL specimens only), is one component of a comprehensive MRSA colonization surveillance program. It is not intended to diagnose MRSA infection nor to guide or monitor treatment for MRSA infections. Test performance is not FDA approved in patients less than 58 years old. Performed at [facility], [address] [place], [place] [address]   Expectorated Sputum Assessment w Gram Stain, Rflx to Resp Cult     Status: None   Collection Time: 11/20/23  5:30 PM   Specimen: Expectorated Sputum  Result Value Ref Range Status   Specimen Description EXPECTORATED SPUTUM  Final   Special Requests SPUTUM  Final   Sputum evaluation   Final    THIS SPECIMEN IS ACCEPTABLE FOR SPUTUM CULTURE Performed at [facility], [address] [place], [place] [address]    Report  Status 11/20/2023 FINAL  Final  Culture, Respiratory w Gram Stain     Status: None   Collection Time: 11/20/23  5:30 PM  Result Value Ref Range Status   Specimen Description   Final    EXPECTORATED SPUTUM Performed at [facility], [address], [place], [place] [address]    Special Requests   Final    SPUTUM Reflexed from [id number] Performed at [facility], [address] [place], [place] [address]    Gram Stain   Final    NO WBC SEEN FEW GRAM POSITIVE COCCI FEW GRAM NEGATIVE RODS RARE GRAM POSITIVE RODS    Culture   Final    Normal respiratory flora-no Staph aureus or Pseudomonas seen Performed at [facility], [address] [place], [place] [address]    Report Status 11/23/2023 FINAL  Final    Labs: CBC: Recent Labs  Lab 11/19/23 1136 11/20/23 0540 11/21/23 0819 11/23/23 0510  WBC 21.7* 21.0* 15.2* 7.2  HGB 13.9 13.0 13.3 12.4*  HCT 42.9 39.4 38.9* 38.2*  MCV 85.3 82.3 82.1 82.3  PLT 162 261 264 271   Basic Metabolic Panel: Recent Labs  Lab 11/19/23 1136 11/20/23 0540 11/21/23 0819 11/23/23 0510  NA 129* 128* 132* 132*  K 5.2* 4.5 4.4 3.7  CL 93* 94* 94* 95*  CO2 22 23 25 28   GLUCOSE 168* 158* 114* 110*  BUN 31* 39* 36* 34*  CREATININE 1.68* 1.55* 1.41* 1.30*  CALCIUM  9.3 8.7* 8.7* 8.8*  MG  --   --  2.1  --    Liver Function Tests: Recent Labs  Lab 11/19/23 1758 11/20/23 0540  AST 35 47*  ALT 14 21  ALKPHOS 55 60  BILITOT 2.1* 2.6*  PROT 6.6 7.0  ALBUMIN 2.9* 3.1*   CBG: No results for input(s): GLUCAP in the last 168 hours.  Discharge time spent: greater than 30 minutes.  Signed: Charlie Patterson, MD [facility] 11/23/2023

## 2023-11-23 NOTE — Progress Notes (Addendum)
 Advanced Heart Failure Rounding Note  Cardiologist: Evalene Lunger, MD  Chief Complaint: acute on chronic systolic heart failure Subjective:   SBP soft. Spiro has been held.    Denies SOB. Able to walk around unit.   Objective:   Weight Range: 85.8 kg Body mass index is 27.93 kg/m.   Vital Signs:   Temp:  [97.6 F (36.4 C)-98 F (36.7 C)] 97.7 F (36.5 C) (08/06 0750) Pulse Rate:  [57-76] 58 (08/06 0750) Resp:  [18-19] 18 (08/06 0424) BP: (84-114)/(52-74) 91/55 (08/06 0750) SpO2:  [98 %-100 %] 100 % (08/06 0750) Weight:  [85.8 kg] 85.8 kg (08/06 0500) Last BM Date : 11/22/23 (Per 3rd shift report)  Weight change: Filed Weights   11/21/23 0400 11/22/23 0500 11/23/23 0500  Weight: 86.5 kg 82.1 kg 85.8 kg    Intake/Output:   Intake/Output Summary (Last 24 hours) at 11/23/2023 0944 Last data filed at 11/23/2023 0424 Gross per 24 hour  Intake 580 ml  Output 1375 ml  Net -795 ml      Physical Exam    General:   No resp difficulty Neck: no JVD.  Cor: Regular rate & rhythm. Lungs: clear Abdomen: soft, nontender, nondistended.  Extremities: no  edema Neuro: alert & oriented x3  SR with PVCs.    Medications:     Scheduled Medications:  amiodarone   200 mg Oral Daily   apixaban   5 mg Oral BID   Chlorhexidine  Gluconate Cloth  6 each Topical Daily   cyanocobalamin   2,000 mcg Oral Daily   doxycycline   100 mg Oral Q12H   empagliflozin   10 mg Oral Daily   fluticasone   1 spray Each Nare Daily   naphazoline-glycerin   1 drop Both Eyes BID   pantoprazole   40 mg Oral Daily   polyethylene glycol  17 g Oral Daily   pyridoxine   500 mg Oral Daily   rosuvastatin   10 mg Oral Daily   tamsulosin   0.4 mg Oral QPC supper   torsemide   20 mg Oral Daily    Infusions:  cefTRIAXone  (ROCEPHIN )  IV 2 g (11/22/23 1316)    PRN Medications: acetaminophen  **OR** acetaminophen , albuterol , guaiFENesin -dextromethorphan , melatonin, ondansetron  **OR** ondansetron  (ZOFRAN ) IV, mouth  rinse    Patient Profile   Jerry Howe is a 68 year old with a history of chronic HFrEF, single chamber ICD, NICM, PVCs, PAF, A fib Ablation 2024, and CKD Stage IIIb.    Admitted with sepsis/pneumonia.     Assessment/Plan   Sepsis due to PNA: Bcx NGTD, WBC trending down as well. - Continue doxycycline  and CTX - Treatment per primary  Acute on chronic biventricular heart failure: Echo similar to prior, biventricular involvement. BP low with ongoing infection. - Appears euvolemic.  Continue torsemide  20mg  daily - Continue jardiance  10mg  daily - SBP remain soft would continue to hold spiro and hold off on bb.   PVCs/NSVT Frequent PVCs/NSVT.  - Transition to amio 200 mg daily   PAF Maintaining SR. Continue amio 200 mg daily  On eliquis  5 mg twice a day    CKD Stage IIIb Creatinine baseline ~ 1.7, stable today.    Heart failure team will sign off as of 11/23/23  HF Team Medication Recommendations for Home: Amio 200 mg daily Torsemide  20 mg daily  Jardiance  10 mg daily  Eliquis  5 mg twice a day     Follow up as an outpatient in the HF clinic at [facility] ?  Yes . He has follow up next Wednesday  at 3:45 .    Length of Stay: 4  Amy Clegg, NP  11/23/2023, 9:44 AM  Advanced Heart Failure Team Pager [phone] (M-F; 7a - 5p)  Please contact [facility] for night-coverage after hours (5p -7a ) and weekends on [email or web address]  Agree with the above NP note.  Patient appears ready for home today. With low BP, will hold off on further medication titration.  Will reassess when he returns to clinic.   HF clinic appt set up.   Jerry Howe 11/23/2023

## 2023-11-23 NOTE — Progress Notes (Signed)
 Nsg Discharge Note  Admit Date:  @ADMITDATE @ Discharge date: @DISCHARGEDATE @   [name] @ to be D/C'd Home per MD order.  AVS completed.  Copy for chart, and copy for patient signed, and dated. Patient/caregiver able to verbalize understanding.   IV catheter discontinued intact. Site without signs and symptoms of complications - no redness or edema noted at insertion site, patient denies c/o pain - only slight tenderness at site.  Dressing with slight pressure applied.  D/c Instructions-Education: Discharge instructions given to patient/family with verbalized understanding. D/c education completed with patient/family including follow up instructions, medication list, d/c activities limitations if indicated, with other d/c instructions as indicated by MD - patient able to verbalize understanding, all questions fully answered. Patient instructed to return to ED, call 911, or call MD for any changes in condition.  Patient escorted via WC, and D/C home via private auto.  Almarie DELENA Garret, RN 11/23/2023 1:05 PM

## 2023-11-23 NOTE — TOC Transition Note (Signed)
 Transition of Care [facility]) - Discharge Note   Patient Details  Name: Jerry Howe MRN: [id number] Date of Birth: 1956/01/18  Transition [facility]) CM/SW Contact:  Lauraine JAYSON Carpen, LCSW Phone Number: 11/23/2023, 11:24 AM   Clinical Narrative: Patient has orders to discharge home today. Readmission prevention screen complete. CSW met with patient. No family at bedside. CSW introduced role and explained that discharge planning would be discussed. PCP is listed as [name], MD but patient said his wife told him it's now [name]. Patient drives himself to appointments. Pharmacy is [facility] [address]. No issues obtaining medications. Patient lives at home with his wife. NO home health or DME use prior to admission. No further concerns. His wife will pick him up today. CSW signing off.    Final next level of care: Home/Self Care Barriers to Discharge: No Barriers Identified   Patient Goals and CMS Choice            Discharge Placement                Patient to be transferred to facility by: Wife   Patient and family notified of of transfer: 11/23/23  Discharge Plan and Services Additional resources added to the After Visit Summary for                                       Social Drivers of Health (SDOH) Interventions SDOH Screenings   Food Insecurity: No Food Insecurity (11/19/2023)  Housing: Low Risk  (11/19/2023)  Transportation Needs: No Transportation Needs (11/19/2023)  Utilities: Not At Risk (11/19/2023)  Depression (PHQ2-9): Medium Risk (09/15/2023)  Social Connections: Socially Integrated (11/19/2023)  Tobacco Use: Low Risk  (11/19/2023)     Readmission Risk Interventions    11/23/2023   11:23 AM  Readmission Risk Prevention Plan  Transportation Screening Complete  PCP or Specialist Appt within 3-5 Days Complete  Social Work Consult for Recovery Care Planning/Counseling Complete  Palliative Care Screening Not Applicable  Medication  Review Oceanographer) Complete

## 2023-11-23 NOTE — Discharge Instructions (Signed)
 Weight yourself daily, if you gain 3 lbs in one day or 5 lbs in a week, call the heart failure clinic

## 2023-11-23 NOTE — Plan of Care (Signed)

## 2023-11-24 ENCOUNTER — Encounter

## 2023-11-24 LAB — CULTURE, BLOOD (ROUTINE X 2)
Culture: NO GROWTH
Culture: NO GROWTH
Special Requests: ADEQUATE

## 2023-11-28 ENCOUNTER — Ambulatory Visit

## 2023-11-28 ENCOUNTER — Encounter

## 2023-11-29 ENCOUNTER — Telehealth: Payer: Self-pay | Admitting: Cardiology

## 2023-11-29 NOTE — Telephone Encounter (Signed)
 Called to confirm/remind patient of their appointment at the Advanced Heart Failure Clinic on 11/30/23.   Appointment:   [x] Confirmed  [] Left mess   [] No answer/No voice mail  [] VM Full/unable to leave message  [] Phone not in service  Patient reminded to bring all medications and/or complete list.  Confirmed patient has transportation. Gave directions, instructed to utilize valet parking.

## 2023-11-30 ENCOUNTER — Encounter

## 2023-11-30 ENCOUNTER — Encounter (INDEPENDENT_AMBULATORY_CARE_PROVIDER_SITE_OTHER): Admitting: Cardiology

## 2023-11-30 ENCOUNTER — Other Ambulatory Visit
Admission: RE | Admit: 2023-11-30 | Discharge: 2023-11-30 | Disposition: A | Discharge status: Home / Self Care | Source: Ambulatory Visit | Attending: Cardiology | Admitting: Cardiology

## 2023-11-30 ENCOUNTER — Ambulatory Visit

## 2023-11-30 ENCOUNTER — Ambulatory Visit: Admitting: Cardiology

## 2023-11-30 ENCOUNTER — Ambulatory Visit (HOSPITAL_COMMUNITY): Payer: Self-pay | Admitting: Cardiology

## 2023-11-30 VITALS — BP 144/79 | HR 80 | Wt 176.0 lb

## 2023-11-30 DIAGNOSIS — N183 Chronic kidney disease, stage 3 unspecified: Secondary | ICD-10-CM | POA: Insufficient documentation

## 2023-11-30 DIAGNOSIS — I5022 Chronic systolic (congestive) heart failure: Secondary | ICD-10-CM

## 2023-11-30 DIAGNOSIS — Z7901 Long term (current) use of anticoagulants: Secondary | ICD-10-CM | POA: Diagnosis not present

## 2023-11-30 DIAGNOSIS — Z79899 Other long term (current) drug therapy: Secondary | ICD-10-CM | POA: Insufficient documentation

## 2023-11-30 DIAGNOSIS — G4733 Obstructive sleep apnea (adult) (pediatric): Secondary | ICD-10-CM

## 2023-11-30 DIAGNOSIS — I472 Ventricular tachycardia, unspecified: Secondary | ICD-10-CM | POA: Insufficient documentation

## 2023-11-30 DIAGNOSIS — I2510 Atherosclerotic heart disease of native coronary artery without angina pectoris: Secondary | ICD-10-CM | POA: Diagnosis not present

## 2023-11-30 DIAGNOSIS — I130 Hypertensive heart and chronic kidney disease with heart failure and stage 1 through stage 4 chronic kidney disease, or unspecified chronic kidney disease: Secondary | ICD-10-CM | POA: Diagnosis not present

## 2023-11-30 DIAGNOSIS — R7989 Other specified abnormal findings of blood chemistry: Secondary | ICD-10-CM

## 2023-11-30 DIAGNOSIS — Z95810 Presence of automatic (implantable) cardiac defibrillator: Secondary | ICD-10-CM | POA: Diagnosis not present

## 2023-11-30 DIAGNOSIS — Z7984 Long term (current) use of oral hypoglycemic drugs: Secondary | ICD-10-CM | POA: Diagnosis not present

## 2023-11-30 DIAGNOSIS — I493 Ventricular premature depolarization: Secondary | ICD-10-CM | POA: Diagnosis not present

## 2023-11-30 DIAGNOSIS — I480 Paroxysmal atrial fibrillation: Secondary | ICD-10-CM | POA: Insufficient documentation

## 2023-11-30 DIAGNOSIS — I428 Other cardiomyopathies: Secondary | ICD-10-CM | POA: Diagnosis not present

## 2023-11-30 LAB — COMPREHENSIVE METABOLIC PANEL WITH GFR
ALT: 29 U/L (ref 0–44)
AST: 30 U/L (ref 15–41)
Albumin: 3.4 g/dL — ABNORMAL LOW (ref 3.5–5.0)
Alkaline Phosphatase: 95 U/L (ref 38–126)
Anion gap: 9 (ref 5–15)
BUN: 23 mg/dL (ref 8–23)
CO2: 25 mmol/L (ref 22–32)
Calcium: 9.2 mg/dL (ref 8.9–10.3)
Chloride: 100 mmol/L (ref 98–111)
Creatinine, Ser: 1.27 mg/dL — ABNORMAL HIGH (ref 0.61–1.24)
GFR, Estimated: >60 mL/min (ref >60)
Glucose, Bld: 113 mg/dL — ABNORMAL HIGH (ref 70–99)
Potassium: 3.9 mmol/L (ref 3.5–5.1)
Sodium: 134 mmol/L — ABNORMAL LOW (ref 135–145)
Total Bilirubin: 1.1 mg/dL (ref 0.0–1.2)
Total Protein: 7.8 g/dL (ref 6.5–8.1)

## 2023-11-30 LAB — BRAIN NATRIURETIC PEPTIDE: B Natriuretic Peptide: 300.1 pg/mL — ABNORMAL HIGH (ref 0.0–100.0)

## 2023-11-30 LAB — TSH: TSH: 0.08 u[IU]/mL — ABNORMAL LOW (ref 0.350–4.500)

## 2023-11-30 NOTE — Progress Notes (Addendum)
 Patient Name:         DOB:       Height:     Weight:  Office Name:         Referring Provider:  Today's Date:  Date:   STOP BANG RISK ASSESSMENT S (snore) Have you been told that you snore?     YES   T (tired) Are you often tired, fatigued, or sleepy during the day?   YES  O (obstruction) Do you stop breathing, choke, or gasp during sleep? YES   P (pressure) Do you have or are you being treated for high blood pressure? YES   B (BMI) Is your body index greater than 35 kg/m? NO   A (age) Are you 68 years old or older? YEs   N (neck) Do you have a neck circumference greater than 16 inches?   NO   G (gender) Are you a male? YES   TOTAL STOP/BANG "YES" ANSWERS 6                                                                       For Office Use Only              Procedure Order Form    YES to 3+ Stop Bang questions OR two clinical symptoms - patient qualifies for WatchPAT (CPT 95800)             Clinical Notes: Will consult Sleep Specialist and refer for management of therapy due to patient increased risk of Sleep Apnea. Ordering a sleep study due to the following two clinical symptoms: Excessive daytime sleepiness G47.10  History of high blood pressure R03.0     I understand that I am proceeding with a home sleep apnea test as ordered by my treating physician. I understand that untreated sleep apnea is a serious cardiovascular risk factor and it is my responsibility to perform the test and seek management for sleep apnea. I will be contacted with the results and be managed for sleep apnea by a local sleep physician. I will be receiving equipment and further instructions from [facility]. I shall promptly ship back the equipment via the included mailing label. I understand my insurance will be billed for the test and as the patient I am responsible for any insurance related out-of-pocket costs incurred. I have been provided with written instructions and can call for additional  video or telephonic instruction, with 24-hour availability of qualified personnel to answer any questions: Patient Help Desk [phone].  Patient Signature ______________________________________________________   Date______________________ Patient Telemedicine Verbal Consent

## 2023-11-30 NOTE — Patient Instructions (Addendum)
 Medication Changes:  Restart Entresto  49-51 MG twice daily TODAY  Restart Spironolactone  25 MG daily FRIDAY  Restart Metoprolol  XL 25 MG once daily SATURDAY  Decrease Torsemide  to 10 MG every other day   Lab Work:  Go over to the [facility]. Go pass the gift shop and have your blood work completed TODAY and AGAIN in 10 DAYS.  We will only call you if the results are abnormal or if the provider would like to make medication changes.  No news is good news.   Testing/Procedures:  Your provider has ordered a CPX test on you. This has been scheduled and will be on the AVS under appointments. You will have to do physical activity, so please dress comfortably for this appointment. Prepare to be there for up to 2 hours. Please arrive early for preparation. Please look at the handout we gave you regarding this testing for more information.   Follow-Up in: 1 month with Dr. Rolan.   Thank you for choosing [facility] Advanced Heart Failure Clinic.    At the Advanced Heart Failure Clinic, you and your health needs are our priority. We have a designated team specialized in the treatment of Heart Failure. This Care Team includes your primary Heart Failure Specialized Cardiologist (physician), Advanced Practice Providers (APPs- Physician Assistants and Nurse Practitioners), and Pharmacist who all work together to provide you with the care you need, when you need it.   You may see any of the following providers on your designated Care Team at your next follow up:  Dr. Toribio Fuel Dr. Ezra Rolan Dr. Ria Commander Dr. Morene Brownie Ellouise Class, FNP Jaun Bash, RPH-CPP  Please be sure to bring in all your medications bottles to every appointment.   Need to Contact Us :  If you have any questions or concerns before your next appointment please send us  a message through [place] or call our office at [phone].    TO LEAVE A MESSAGE FOR THE NURSE SELECT OPTION 2,  PLEASE LEAVE A MESSAGE INCLUDING: YOUR NAME DATE OF BIRTH CALL BACK NUMBER REASON FOR CALL**this is important as we prioritize the call backs  YOU WILL RECEIVE A CALL BACK THE SAME DAY AS LONG AS YOU CALL BEFORE 4:00 PM

## 2023-12-01 ENCOUNTER — Encounter

## 2023-12-01 ENCOUNTER — Ambulatory Visit: Attending: Internal Medicine

## 2023-12-01 ENCOUNTER — Other Ambulatory Visit
Admission: RE | Admit: 2023-12-01 | Discharge: 2023-12-01 | Disposition: A | Discharge status: Home / Self Care | Source: Ambulatory Visit | Attending: Cardiology | Admitting: Cardiology

## 2023-12-01 DIAGNOSIS — I5022 Chronic systolic (congestive) heart failure: Secondary | ICD-10-CM

## 2023-12-01 DIAGNOSIS — R7989 Other specified abnormal findings of blood chemistry: Secondary | ICD-10-CM

## 2023-12-01 NOTE — Progress Notes (Signed)
 PCP: Sherial Bail, MD Cardiology: Dr. Perla  67 y.o. with history of paroxysmal atrial fibrillation, nonischemic cardiomyopathy with Medtronic ICD, CKD stage 3, and PVCs returns for followup of CHF.  HF was diagnosed in 2016, LHC at that time revealed minimal CAD.  LVEF was 25%.  He was followed by [facility] clinic and then transitioned to [facility] heart care.  He established with Dr. Gollan on 12/2021 and Dr. Cindie on 01/2022.  He reported NYHA Class II symptoms and lisinopril  was switched to Entresto .      He presented to the ED  10/22 after his defibrillator fired 6 times   In the ED he was noted to be in atrial fibrillation. ICD interogation showed inappropriate ICD firing for AF. Started on amiodarone . Also found to have frequent PVCs.   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      Had AF ablation on 05/24/22. TEE at that time EF 20-25%, mild to moderate MR.   Patient was admitted in 8/25 with severe PNA.  Entresto , spironolactone , and Toprol  XL were stopped due to low blood pressure.  Echo in 8/25 showed EF < 20%, severe LV dilation, moderate RV dysfunction, mild-moderate MR.   He feels much better now that when he was admitted with PNA.  He is taking torsemide  20 mg daily. Before admission, he was only taking torsemide  prn. He is now off antibiotics.  No dyspnea walking on flat ground or up a flight of stairs. BP mildly elevated today.  No chest pain.  No orthopnea/PND.  No lightheadedness.    ECG (personally reviewed): NSR, LVH with repolarization abnormality.   Labs (8/25): K 3.7, creatinine 1.3  PMH: 1. Chronic systolic CHF: Nonischemic cardiomyopathy.  Medtronic ICD.  - Echo (8/16): EF 25-30%. - LHC (2016): No significant CAD.  - Echo (10/23): EF 20-25% - LHC (10/23): Mild nonobstructive CAD - Echo (8/25): EF < 20%, severe LV dilation, moderate RV dysfunction, mild-moderate MR.  2. Atrial fibrillation: Paroxysmal.  Ablation in 2/24.  3. CKD stage 3 4. PVCs/NSVT 5.  HTN 6. Nephrolithiasis  ROS: All systems reviewed and negative except as per HPI.   Social History   Socioeconomic History   Marital status: Married    Spouse name: Suzen   Number of children: 5   Years of education: Not on file   Highest education level: Not on file  Occupational History   Not on file  Tobacco Use   Smoking status: Never   Smokeless tobacco: Never   Tobacco comments:    Never smoke 06/21/22  Vaping Use   Vaping status: Never Used  Substance and Sexual Activity   Alcohol use: No   Drug use: Not Currently   Sexual activity: Yes    Partners: Female  Other Topics Concern   Not on file  Social History Narrative   Lives at home with spouse; 5 kids, 11 grandkids & 2 great grandkids   Social Drivers of Corporate investment banker Strain: Not on file  Food Insecurity: No Food Insecurity (11/19/2023)   Hunger Vital Sign    Worried About Running Out of Food in the Last Year: Never true    Ran Out of Food in the Last Year: Never true  Transportation Needs: No Transportation Needs (11/19/2023)   PRAPARE - Administrator, Civil Service (Medical): No    Lack of Transportation (Non-Medical): No  Physical Activity: Not on file  Stress: Not on file  Social Connections: Socially Integrated (11/19/2023)  Social Advertising account executive    Frequency of Communication with Friends and Family: More than three times a week    Frequency of Social Gatherings with Friends and Family: More than three times a week    Attends Religious Services: More than 4 times per year    Active Member of [employer] or Organizations: Yes    Attends Banker Meetings: 1 to 4 times per year    Marital Status: Married  Catering manager Violence: Not At Risk (11/19/2023)   Humiliation, Afraid, Rape, and Kick questionnaire    Fear of Current or Ex-Partner: No    Emotionally Abused: No    Physically Abused: No    Sexually Abused: No   Family History  Problem Relation  Age of Onset   Hyperlipidemia Mother    Hypertension Father    Heart disease Father    Hyperlipidemia Brother    Diabetes Brother    Heart failure Neg Hx     Current Outpatient Medications  Medication Sig Dispense Refill   albuterol  (PROVENTIL  HFA;VENTOLIN  HFA) 108 (90 Base) MCG/ACT inhaler Inhale 1 puff into the lungs every 6 (six) hours as needed for wheezing or shortness of breath.     amiodarone  (PACERONE ) 200 MG tablet Take 1 tablet (200 mg total) by mouth daily. 90 tablet 3   apixaban  (ELIQUIS ) 5 MG TABS tablet Take 1 tablet (5 mg total) by mouth 2 (two) times daily. 180 tablet 3   Ascorbic Acid  (VITAMIN C ) 1000 MG tablet Take 1,000 mg by mouth daily.     Cholecalciferol 125 MCG (5000 UT) CHEW 1 capsule.     cyanocobalamin  2000 MCG tablet Take 2,000 mcg by mouth daily.     empagliflozin  (JARDIANCE ) 10 MG TABS tablet Take 1 tablet (10 mg total) by mouth daily. 90 tablet 3   fluticasone  (FLONASE ) 50 MCG/ACT nasal spray Place 1 spray into both nostrils in the morning and at bedtime.     Multiple Vitamins-Minerals (SUPER THERA VITE M PO) Take 1 tablet by mouth daily.     Naphazoline-Glycerin  (REDNESS RELIEF OP) Place 1 drop into both eyes 2 (two) times daily.     omeprazole (PRILOSEC) 20 MG capsule Take 20 mg by mouth daily.     pyridoxine  (B-6) 500 MG tablet Take 500 mg by mouth daily.     rosuvastatin  (CRESTOR ) 10 MG tablet Take 1 tablet (10 mg total) by mouth daily. 90 tablet 3   torsemide  (DEMADEX ) 20 MG tablet Take 1 tablet (20 mg total) by mouth daily as needed. 90 tablet 3   Vitamin E 180 MG (400 UNIT) CAPS 1 capsule.     Zinc Gluconate 100 MG TABS 1 tablet Orally Once a day for health maintenance     No current facility-administered medications for this visit.   BP (!) 144/79   Pulse 80   Wt 176 lb (79.8 kg)   SpO2 100%   BMI 25.99 kg/m  General: NAD Neck: No JVD, no thyromegaly or thyroid  nodule.  Lungs: Clear to auscultation bilaterally with normal respiratory  effort. CV: Lateral PMI.  Heart regular S1/S2, no S3/S4, no murmur.  No peripheral edema.  No carotid bruit.  Normal pedal pulses.  Abdomen: Soft, nontender, no hepatosplenomegaly, no distention.  Skin: Intact without lesions or rashes.  Neurologic: Alert and oriented x 3.  Psych: Normal affect. Extremities: No clubbing or cyanosis.  HEENT: Normal.   Assessment/Plan: 1. Chronic systolic CHF: Nonischemic cardiomyopathy.  Medtronic ICD.  Caths in  2016 and 10/23 with minimal CAD.  Echo in 8/16 with EF 25-30%.  Echo in 8/25 with EF < 20%, severe LV dilation, moderate RV dysfunction, mild-moderate MR.  NYHA class II currently, not volume overloaded on exam.  Off multiple meds since admission with PNA when BP was low, now BP is mildly elevated.  - Continue Jardiance  10 mg daily.  - Restart Entresto  49/51 bid today.  - Restart spironolactone  25 mg daily on Friday.  - Restart Toprol  XL 25 mg daily on Saturday.  - Decrease torsemide  to 10 mg every other day.  - BMET/BNP today, BMET in 10 days.  - With long-standing cardiomyopathy, I will arrange for CPX.  2. Atrial fibrillation: Paroxysmal.  S/p ablation in 2/24.  NSR today.  - Continue amiodarone  200 mg daily, check LFTs and TSH today.  He will need a regular eye exam.  - Continue apixaban .  3. CKD stage 3: Follow creatinine with restarting meds.  4. PVCs/NSVT: Has ICD, on amiodarone .   Followup 1 month.   I spent 41 minutes reviewing records, interviewing/examining patient, and managing orders.   Ezra Shuck 12/01/2023

## 2023-12-01 NOTE — Procedures (Signed)
    SLEEP STUDY REPORT Patient Information Study Date: 11/30/2023 Patient Name: Jerry Howe Patient ID: [id number] Birth Date: 03/27/1956 Age: 68 Gender: BMI: 26.1 (W=176 lb, H=5' 9'') Referring Physician: Rolan Fuel, MD  TEST DESCRIPTION: Home sleep apnea testing was completed using the WatchPat, a Type 1 device, utilizing peripheral arterial tonometry (PAT), chest movement, actigraphy, pulse oximetry, pulse rate, body position and snore. AHI was calculated with apnea and hypopnea using valid sleep time as the denominator. RDI includes apneas, hypopneas, and RERAs. The data acquired and the scoring of sleep and all associated events were performed in accordance with the recommended standards and specifications as outlined in the AASM Manual for the Scoring of Sleep and Associated Events 2.2.0 (2015).  FINDINGS:  1. Mild Obstructive Sleep Apnea with AHI 11.3/hr.  2. No Central Sleep Apnea with pAHIc 7.7/hr.  3. Oxygen desaturations as low as 88%.  4. Moderate to severe snoring was present. O2 sats were < 88% for 0.1 min.  5. Total sleep time was 4 hrs and 24 min.  6. 23.5% of total sleep time was spent in REM sleep.  7. Normal sleep onset latency at 16 min.  8. Normal REM sleep onset latency at 99 min.  9. Total awakenings were 8. 10. 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 Clinical correlation of these findings is necessary. The decision to treat obstructive sleep apnea (OSA) is usually based on the presence of apnea symptoms or the presence of associated medical conditions such as Hypertension, Congestive Heart Failure, Atrial Fibrillation or Obesity. The most common symptoms of OSA are snoring, gasping for breath while sleeping, daytime sleepiness and fatigue. 2. Initiating apnea therapy is recommended given the presence of symptoms and/or associated conditions. Recommend proceeding with one of the following:  a.  Auto-CPAP therapy with a pressure range of 5-20cm H2O.  b. An oral appliance (OA) that can be obtained from certain dentists with expertise in sleep medicine. These are primarily of use in non-obese patients with mild and moderate disease.  c. An ENT consultation which may be useful to look for specific causes of obstruction and possible treatment options.  d. If patient is intolerant to PAP therapy, consider referral to ENT for evaluation for hypoglossal nerve stimulator. 3. Close follow-up is necessary to ensure success with CPAP or oral appliance therapy for maximum benefit . 4. A follow-up oximetry study on CPAP is recommended to assess the adequacy of therapy and determine the need for supplemental oxygen or the potential need for Bi-level therapy. An arterial blood gas to determine the adequacy of baseline ventilation and oxygenation should also be considered. 5. Healthy sleep recommendations include: adequate nightly sleep (normal 7-9 hrs/night), avoidance of caffeine after noon and alcohol near bedtime, and maintaining a sleep environment that is cool, dark and quiet. 6. Weight loss for overweight patients is recommended. Even modest amounts of weight loss can significantly improve the severity of sleep apnea. 7. Snoring recommendations include: weight loss where appropriate, side sleeping, and avoidance of alcohol before bed. 8. Operation of motor vehicle should be avoided when sleepy.  Signature: Wilbert Bihari, MD; [facility]; Diplomat, American Board of Sleep Medicine Electronically Signed: 12/01/2023 4:55:02 PM

## 2023-12-05 ENCOUNTER — Encounter

## 2023-12-05 ENCOUNTER — Ambulatory Visit

## 2023-12-07 ENCOUNTER — Encounter (HOSPITAL_COMMUNITY)

## 2023-12-07 ENCOUNTER — Encounter

## 2023-12-07 ENCOUNTER — Ambulatory Visit

## 2023-12-08 ENCOUNTER — Encounter

## 2023-12-12 ENCOUNTER — Encounter

## 2023-12-12 ENCOUNTER — Telehealth: Payer: Self-pay | Admitting: *Deleted

## 2023-12-12 ENCOUNTER — Ambulatory Visit

## 2023-12-12 DIAGNOSIS — I2510 Atherosclerotic heart disease of native coronary artery without angina pectoris: Secondary | ICD-10-CM

## 2023-12-12 DIAGNOSIS — I5022 Chronic systolic (congestive) heart failure: Secondary | ICD-10-CM

## 2023-12-12 DIAGNOSIS — G4733 Obstructive sleep apnea (adult) (pediatric): Secondary | ICD-10-CM

## 2023-12-12 NOTE — Telephone Encounter (Signed)
-----   Message from Wilbert Bihari sent at 12/01/2023  4:56 PM EDT ----- Please let patient know that they have sleep apnea and recommend treating with CPAP.  Please order an auto CPAP from 4-15cm H2O with heated humidity and mask of choice.  Order overnight pulse ox on CPAP.  Followup with me in 6 weeks.

## 2023-12-12 NOTE — Telephone Encounter (Signed)
 The patient has been notified of the result and verbalized understanding.  All questions (if any) were answered. Jerry Howe, CMA 12/12/2023 3:50 PM     Upon patient request DME selection is [facility]. Patient understands he will be contacted by [facility] to set up his cpap. Patient understands to call if [facility] does not contact him with new setup in a timely manner. Patient understands they will be called once confirmation has been received from [facility]/ that they have received their new machine to schedule 10 week follow up appointment.   [facility] notified of new cpap order  Please add to airview Patient was grateful for the call and thanked me.

## 2023-12-13 ENCOUNTER — Ambulatory Visit (HOSPITAL_COMMUNITY): Attending: Internal Medicine

## 2023-12-13 DIAGNOSIS — G4733 Obstructive sleep apnea (adult) (pediatric): Secondary | ICD-10-CM | POA: Insufficient documentation

## 2023-12-13 DIAGNOSIS — I2583 Coronary atherosclerosis due to lipid rich plaque: Secondary | ICD-10-CM | POA: Insufficient documentation

## 2023-12-13 DIAGNOSIS — I2510 Atherosclerotic heart disease of native coronary artery without angina pectoris: Secondary | ICD-10-CM | POA: Diagnosis not present

## 2023-12-13 DIAGNOSIS — I5022 Chronic systolic (congestive) heart failure: Secondary | ICD-10-CM | POA: Diagnosis not present

## 2023-12-14 ENCOUNTER — Ambulatory Visit

## 2023-12-14 ENCOUNTER — Encounter

## 2023-12-14 ENCOUNTER — Ambulatory Visit (HOSPITAL_COMMUNITY): Payer: Self-pay | Admitting: Internal Medicine

## 2023-12-14 DIAGNOSIS — G4733 Obstructive sleep apnea (adult) (pediatric): Secondary | ICD-10-CM

## 2023-12-14 NOTE — Telephone Encounter (Signed)
 Patient returned call Aware of results Referral places

## 2023-12-15 ENCOUNTER — Encounter

## 2023-12-16 ENCOUNTER — Ambulatory Visit (HOSPITAL_COMMUNITY): Payer: Self-pay | Admitting: Cardiology

## 2023-12-16 DIAGNOSIS — I5022 Chronic systolic (congestive) heart failure: Secondary | ICD-10-CM | POA: Diagnosis not present

## 2023-12-20 ENCOUNTER — Ambulatory Visit: Admission: RE | Admit: 2023-12-20 | Source: Ambulatory Visit

## 2023-12-21 ENCOUNTER — Ambulatory Visit

## 2023-12-26 ENCOUNTER — Ambulatory Visit

## 2023-12-28 ENCOUNTER — Ambulatory Visit

## 2024-01-02 ENCOUNTER — Ambulatory Visit (INDEPENDENT_AMBULATORY_CARE_PROVIDER_SITE_OTHER): Payer: Medicare HMO

## 2024-01-02 ENCOUNTER — Ambulatory Visit

## 2024-01-02 DIAGNOSIS — I5022 Chronic systolic (congestive) heart failure: Secondary | ICD-10-CM

## 2024-01-02 LAB — CUP PACEART REMOTE DEVICE CHECK
Battery Remaining Longevity: 43 mo
Battery Voltage: 2.97 V
Brady Statistic RV Percent Paced: 0.07 %
Date Time Interrogation Session: 20250915012405
HighPow Impedance: 59 Ohm
Implantable Lead Connection Status: 753985
Implantable Lead Implant Date: 20170428
Implantable Lead Location: 753862
Implantable Pulse Generator Implant Date: 20170428
Lead Channel Impedance Value: 304 Ohm
Lead Channel Impedance Value: 342 Ohm
Lead Channel Pacing Threshold Amplitude: 0.875 V
Lead Channel Pacing Threshold Pulse Width: 0.4 ms
Lead Channel Sensing Intrinsic Amplitude: 8.125 mV
Lead Channel Sensing Intrinsic Amplitude: 8.125 mV
Lead Channel Setting Pacing Amplitude: 2 V
Lead Channel Setting Pacing Pulse Width: 0.4 ms
Lead Channel Setting Sensing Sensitivity: 0.3 mV
Zone Setting Status: 755011
Zone Setting Status: 755011

## 2024-01-03 ENCOUNTER — Telehealth: Payer: Self-pay | Admitting: Cardiology

## 2024-01-03 ENCOUNTER — Encounter: Admitting: Cardiology

## 2024-01-03 NOTE — Telephone Encounter (Signed)
 Called to confirm/remind patient of their appointment at the [facility] on 01/04/24.   Appointment:   [x] Confirmed  [] Left mess   [] No answer/No voice mail  [] VM Full/unable to leave message  [] Phone not in service  Patient reminded to bring all medications and/or complete list.  Confirmed patient has transportation. Gave directions, instructed to utilize valet parking.

## 2024-01-04 ENCOUNTER — Encounter: Payer: Self-pay | Admitting: Cardiology

## 2024-01-04 ENCOUNTER — Ambulatory Visit (HOSPITAL_BASED_OUTPATIENT_CLINIC_OR_DEPARTMENT_OTHER): Admitting: Cardiology

## 2024-01-04 ENCOUNTER — Ambulatory Visit

## 2024-01-04 ENCOUNTER — Other Ambulatory Visit
Admission: RE | Admit: 2024-01-04 | Discharge: 2024-01-04 | Disposition: A | Discharge status: Home / Self Care | Source: Ambulatory Visit | Attending: Cardiology | Admitting: Cardiology

## 2024-01-04 VITALS — BP 114/73 | HR 81 | Wt 181.6 lb

## 2024-01-04 DIAGNOSIS — I5022 Chronic systolic (congestive) heart failure: Secondary | ICD-10-CM

## 2024-01-04 LAB — TSH: TSH: 0.261 u[IU]/mL — ABNORMAL LOW (ref 0.350–4.500)

## 2024-01-04 LAB — BASIC METABOLIC PANEL WITH GFR
Anion gap: 8 (ref 5–15)
BUN: 21 mg/dL (ref 8–23)
CO2: 27 mmol/L (ref 22–32)
Calcium: 9.2 mg/dL (ref 8.9–10.3)
Chloride: 100 mmol/L (ref 98–111)
Creatinine, Ser: 1.17 mg/dL (ref 0.61–1.24)
GFR, Estimated: >60 mL/min (ref >60)
Glucose, Bld: 115 mg/dL — ABNORMAL HIGH (ref 70–99)
Potassium: 4 mmol/L (ref 3.5–5.1)
Sodium: 135 mmol/L (ref 135–145)

## 2024-01-04 LAB — BRAIN NATRIURETIC PEPTIDE: B Natriuretic Peptide: 721.8 pg/mL — ABNORMAL HIGH (ref 0.0–100.0)

## 2024-01-04 LAB — T4, FREE: Free T4: 0.95 ng/dL (ref 0.61–1.12)

## 2024-01-04 MED ORDER — DIGOXIN 125 MCG PO TABS: 0.1250 mg | ORAL_TABLET | Freq: Every day | ORAL | 5 refills | Status: AC

## 2024-01-04 MED ORDER — SPIRONOLACTONE 25 MG PO TABS: 25.0000 mg | ORAL_TABLET | Freq: Every day | ORAL | 3 refills | Status: AC

## 2024-01-04 MED ORDER — METOPROLOL SUCCINATE ER 25 MG PO TB24: 12.5000 mg | ORAL_TABLET | Freq: Every day | ORAL | 3 refills | Status: AC

## 2024-01-04 MED ORDER — AMIODARONE HCL 100 MG PO TABS: 100.0000 mg | ORAL_TABLET | Freq: Every day | ORAL | 3 refills | Status: AC

## 2024-01-04 MED ORDER — TORSEMIDE 10 MG PO TABS: 10.0000 mg | ORAL_TABLET | Freq: Every day | ORAL | 5 refills | Status: DC

## 2024-01-04 NOTE — Patient Instructions (Signed)
 Medication Changes:  DECREASE AMIODARONE  TO 100 MG ONCE DAILY  TAKE TORSEMIDE  10 MG ONCE DAILY  CONTINUE TAKING METOPROLOL  25 MG ONCE DAILY AND SPIRONOLACTONE  25 MG ONCE DAILY   Lab Work:  Go over to the [facility]. Go pass the gift shop and have your blood work completed TODAY AND AGAIN IN 10 DAYS AFTER MEDICATION CHANGES.  We will only call you if the results are abnormal or if the provider would like to make medication changes.  No news is good news.   Follow-Up in: 1 MONTH WITH DR. CHERRIE   Thank you for choosing [facility].    At the [facility], you and your health needs are our priority. We have a designated team specialized in the treatment of Heart Failure. This Care Team includes your primary Heart Failure Specialized Cardiologist (physician), Advanced Practice Providers (APPs- Physician Assistants and Nurse Practitioners), and Pharmacist who all work together to provide you with the care you need, when you need it.   You may see any of the following providers on your designated Care Team at your next follow up:  Dr. Toribio CHERRIE Dr. Ezra Shuck Dr. Ria Commander Dr. Morene Brownie Ellouise Class, FNP Jaun Bash, RPH-CPP  Please be sure to bring in all your medications bottles to every appointment.   Need to Contact Us :  If you have any questions or concerns before your next appointment please send us  a message through [facility] or call our office at [phone].    TO LEAVE A MESSAGE FOR THE NURSE SELECT OPTION 2, PLEASE LEAVE A MESSAGE INCLUDING: YOUR NAME DATE OF BIRTH CALL BACK NUMBER REASON FOR CALL**this is important as we prioritize the call backs  YOU WILL RECEIVE A CALL BACK THE SAME DAY AS LONG AS YOU CALL BEFORE 4:00 PM

## 2024-01-04 NOTE — Progress Notes (Signed)
 PCP: Sherial Bail, MD Cardiology: Dr. Perla  68 y.o. with history of paroxysmal atrial fibrillation, nonischemic cardiomyopathy with Medtronic ICD, CKD stage 3, and PVCs returns for followup of CHF.  HF was diagnosed in 2016, LHC at that time revealed minimal CAD.  LVEF was 25%.  He was followed by [facility] clinic and then transitioned to [facility] heart care.  He established with Dr. Gollan on 12/2021 and Dr. Cindie on 01/2022.  He reported NYHA Class II symptoms and lisinopril  was switched to Entresto .      He presented to the ED  10/22 after his defibrillator fired 6 times   In the ED he was noted to be in atrial fibrillation. ICD interogation showed inappropriate ICD firing for AF. Started on amiodarone . Also found to have frequent PVCs.   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      Had AF ablation on 05/24/22. TEE at that time EF 20-25%, mild to moderate MR.   Patient was admitted in 8/25 with severe PNA.  Entresto , spironolactone , and Toprol  XL were stopped due to low blood pressure.  Echo in 8/25 showed EF < 20%, severe LV dilation, moderate RV dysfunction, mild-moderate MR.   CPX in 8/25 showed severe functional limitation due to heart failure.   Patient returns for followup of CHF.  After last appointment, TSH was found to be low but patient never returned for followup labs. He is back to work as a Arboriculturist.  He gets fullness in his abdomen at times and will take torsemide .  He thinks he takes torsemide  once or twice a week and wants to know if he could just take it regularly.  At last appointment, he was told to sequentially start back on Entresto  then spironolactone  then Toprol  XL.  He says he is taking them all but does seem a little confused about his medication regimen. SBP 90s-100s on home readings.  No lightheadedness.  Despite poor CPX recently, he says that he does not get short of breath walking on flat ground or up a flight of stairs.  He washed his car last weekend with  mild fatigue.  His wife thinks he may be under-reporting his symptoms.  Weight is up about 5 lbs.   Medtronic device interrogation: No VT, short AF episodes (last in 8/25).   ECG (personally reviewed): NSR, PVCs, IVCD with QRS 126 msec  Labs (8/25): K 3.7, creatinine 1.3 => 1.37, BNP 300, TSH 0.08 (low), LFTs normal.   PMH: 1. Chronic systolic CHF: Nonischemic cardiomyopathy.  Medtronic ICD.  - Echo (8/16): EF 25-30%. - LHC (2016): No significant CAD.  - Echo (10/23): EF 20-25% - LHC (10/23): Mild nonobstructive CAD - Echo (8/25): EF < 20%, severe LV dilation, moderate RV dysfunction, mild-moderate MR.  - CPX (8/25): Peak VO2 8.7, RER 1.1, VE/VCO2 slope 58. Severe functional limitation due to HF.  2. Atrial fibrillation: Paroxysmal.  Ablation in 2/24.  3. CKD stage 3 4. PVCs/NSVT 5. HTN 6. Nephrolithiasis  ROS: All systems reviewed and negative except as per HPI.   Social History   Socioeconomic History   Marital status: Married    Spouse name: Suzen   Number of children: 5   Years of education: Not on file   Highest education level: Not on file  Occupational History   Not on file  Tobacco Use   Smoking status: Never   Smokeless tobacco: Never   Tobacco comments:    Never smoke 06/21/22  Vaping Use   Vaping  status: Never Used  Substance and Sexual Activity   Alcohol use: No   Drug use: Not Currently   Sexual activity: Yes    Partners: Female  Other Topics Concern   Not on file  Social History Narrative   Lives at home with spouse; 5 kids, 11 grandkids & 2 great grandkids   Social Drivers of Corporate investment banker Strain: Not on file  Food Insecurity: No Food Insecurity (11/19/2023)   Hunger Vital Sign    Worried About Running Out of Food in the Last Year: Never true    Ran Out of Food in the Last Year: Never true  Transportation Needs: No Transportation Needs (11/19/2023)   PRAPARE - Administrator, Civil Service (Medical): No    Lack of  Transportation (Non-Medical): No  Physical Activity: Not on file  Stress: Not on file  Social Connections: Socially Integrated (11/19/2023)   Social Connection and Isolation Panel    Frequency of Communication with Friends and Family: More than three times a week    Frequency of Social Gatherings with Friends and Family: More than three times a week    Attends Religious Services: More than 4 times per year    Active Member of [employer] or Organizations: Yes    Attends Banker Meetings: 1 to 4 times per year    Marital Status: Married  Catering manager Violence: Not At Risk (11/19/2023)   Humiliation, Afraid, Rape, and Kick questionnaire    Fear of Current or Ex-Partner: No    Emotionally Abused: No    Physically Abused: No    Sexually Abused: No   Family History  Problem Relation Age of Onset   Hyperlipidemia Mother    Hypertension Father    Heart disease Father    Hyperlipidemia Brother    Diabetes Brother    Heart failure Neg Hx     Current Outpatient Medications  Medication Sig Dispense Refill   albuterol  (PROVENTIL  HFA;VENTOLIN  HFA) 108 (90 Base) MCG/ACT inhaler Inhale 1 puff into the lungs every 6 (six) hours as needed for wheezing or shortness of breath.     amiodarone  (PACERONE ) 100 MG tablet Take 1 tablet (100 mg total) by mouth daily. 90 tablet 3   apixaban  (ELIQUIS ) 5 MG TABS tablet Take 1 tablet (5 mg total) by mouth 2 (two) times daily. 180 tablet 3   Ascorbic Acid  (VITAMIN C ) 1000 MG tablet Take 1,000 mg by mouth daily.     Cholecalciferol 125 MCG (5000 UT) CHEW 1 capsule.     cyanocobalamin  2000 MCG tablet Take 2,000 mcg by mouth daily.     digoxin  (LANOXIN ) 0.125 MG tablet Take 1 tablet (0.125 mg total) by mouth daily. 30 tablet 5   empagliflozin  (JARDIANCE ) 10 MG TABS tablet Take 1 tablet (10 mg total) by mouth daily. 90 tablet 3   fluticasone  (FLONASE ) 50 MCG/ACT nasal spray Place 1 spray into both nostrils in the morning and at bedtime.     hydrOXYzine  (ATARAX) 25 MG tablet Take 25 mg by mouth every 8 (eight) hours as needed for anxiety.     metoprolol  succinate (TOPROL -XL) 25 MG 24 hr tablet Take 0.5 tablets (12.5 mg total) by mouth daily. Take with or immediately following a meal. 45 tablet 3   Multiple Vitamins-Minerals (SUPER THERA VITE M PO) Take 1 tablet by mouth daily.     Naphazoline-Glycerin  (REDNESS RELIEF OP) Place 1 drop into both eyes 2 (two) times daily.  omeprazole (PRILOSEC) 20 MG capsule Take 20 mg by mouth daily.     pyridoxine  (B-6) 500 MG tablet Take 500 mg by mouth daily.     rosuvastatin  (CRESTOR ) 10 MG tablet Take 1 tablet (10 mg total) by mouth daily. 90 tablet 3   sacubitril -valsartan  (ENTRESTO ) 49-51 MG Take 1 tablet by mouth 2 (two) times daily.     sertraline (ZOLOFT) 25 MG tablet Take 25 mg by mouth daily.     spironolactone  (ALDACTONE ) 25 MG tablet Take 1 tablet (25 mg total) by mouth daily. 90 tablet 3   torsemide  (DEMADEX ) 10 MG tablet Take 1 tablet (10 mg total) by mouth daily. 30 tablet 5   Vitamin E 180 MG (400 UNIT) CAPS 1 capsule.     Zinc Gluconate 100 MG TABS 1 tablet Orally Once a day for health maintenance     No current facility-administered medications for this visit.   BP 114/73   Pulse 81   Wt 181 lb 9.6 oz (82.4 kg)   SpO2 100%   BMI 26.82 kg/m  General: NAD Neck: No JVD, no thyromegaly or thyroid  nodule.  Lungs: Clear to auscultation bilaterally with normal respiratory effort. CV: Nondisplaced PMI.  Heart regular S1/S2, no S3/S4, no murmur.  No peripheral edema.  No carotid bruit.  Normal pedal pulses.  Abdomen: Soft, nontender, no hepatosplenomegaly, no distention.  Skin: Intact without lesions or rashes.  Neurologic: Alert and oriented x 3.  Psych: Normal affect. Extremities: No clubbing or cyanosis.  HEENT: Normal.   Assessment/Plan: 1. Chronic systolic CHF: Nonischemic cardiomyopathy.  Medtronic ICD.  Caths in 2016 and 10/23 with minimal CAD.  Echo in 8/16 with EF 25-30%.   Echo in 8/25 with EF < 20%, severe LV dilation, moderate RV dysfunction, mild-moderate MR.  CPX in 9/25 showed severe functional limitation due to heart failure.  CPX is significantly out of proportion to his symptoms, he describes NYHA class II symptoms at most (?under-reporting).  He is not volume overloaded by exam. SBP 90s-100s at home, no BP room to titrate meds.  - Continue Jardiance  10 mg daily.  - Continue Entresto  49/51 bid today.  - Continue spironolactone  25 mg daily.  - Continue Toprol  XL 25 mg daily.  - He can take torsemide  10 mg every other day.  BMET/BNP today and BMET in 10 days.  - Start digoxin  0.125 daily.  - CPX concerns me that patient is nearing the need for advanced therapies though he reports minimal symptoms. He is generally healthy other than his heart problems with a supportive wife.  I think he would be a candidate for transplant or for LVAD. We discussed options today.  Unfortunately, he did not return to get workup of hyperthyroidism after last labs.  We will get full thyroid  labs today and start treatment for hyperthyroidism based on results. Once hyperthyroidism treatment is underway, he will need RHC to assess filling pressures and cardiac output.  I will set him up to see Dr. Cherrie in about a month to arrange this.  2. Atrial fibrillation: Paroxysmal.  S/p ablation in 2/24.  NSR today.  - With suspected hyperthyroidism, I will decrease amiodarone  to 100 mg daily, may need to stop eventually.   - Continue apixaban .  3. Hyperthyroidism: TSH was 0.08 in 8/25.  Suspect hyperthyroidism was triggered by amiodarone .  - I will send TSH, free T3, and free T4 today.  - If labs today confirm hyperthyroidism, start methimazole and refer to endocrinology.  - Decrease  amiodarone  to 100 mg daily and would favor trying to stop this in the future.  4. PVCs/NSVT: Has ICD, on amiodarone .   Followup 1 month with Dr. Bensimhon.   I spent 42 minutes reviewing records,  interviewing/examining patient, and managing orders.   Jerry Howe 01/04/2024

## 2024-01-05 ENCOUNTER — Ambulatory Visit (HOSPITAL_COMMUNITY): Payer: Self-pay | Admitting: Cardiology

## 2024-01-05 ENCOUNTER — Ambulatory Visit: Payer: Self-pay | Admitting: Cardiology

## 2024-01-05 DIAGNOSIS — R7989 Other specified abnormal findings of blood chemistry: Secondary | ICD-10-CM

## 2024-01-06 LAB — T3, FREE: T3, Free: 2.9 pg/mL (ref 2.0–4.4)

## 2024-01-09 ENCOUNTER — Ambulatory Visit

## 2024-01-09 NOTE — Progress Notes (Signed)
Remote ICD Transmission.

## 2024-01-11 ENCOUNTER — Ambulatory Visit

## 2024-01-16 ENCOUNTER — Ambulatory Visit

## 2024-01-18 ENCOUNTER — Ambulatory Visit

## 2024-01-23 ENCOUNTER — Ambulatory Visit

## 2024-01-25 ENCOUNTER — Ambulatory Visit

## 2024-02-01 DIAGNOSIS — I509 Heart failure, unspecified: Secondary | ICD-10-CM | POA: Diagnosis not present

## 2024-02-01 DIAGNOSIS — R600 Localized edema: Secondary | ICD-10-CM | POA: Diagnosis not present

## 2024-02-01 DIAGNOSIS — R058 Other specified cough: Secondary | ICD-10-CM | POA: Diagnosis not present

## 2024-02-06 ENCOUNTER — Encounter: Admitting: Internal Medicine

## 2024-02-09 ENCOUNTER — Emergency Department
Admission: EM | Admit: 2024-02-09 | Discharge: 2024-02-09 | Disposition: A | Discharge status: Home / Self Care | Attending: Emergency Medicine | Admitting: Emergency Medicine

## 2024-02-09 ENCOUNTER — Other Ambulatory Visit: Payer: Self-pay

## 2024-02-09 ENCOUNTER — Emergency Department

## 2024-02-09 DIAGNOSIS — I509 Heart failure, unspecified: Secondary | ICD-10-CM | POA: Diagnosis not present

## 2024-02-09 DIAGNOSIS — R600 Localized edema: Secondary | ICD-10-CM | POA: Diagnosis not present

## 2024-02-09 DIAGNOSIS — M7989 Other specified soft tissue disorders: Secondary | ICD-10-CM

## 2024-02-09 DIAGNOSIS — R918 Other nonspecific abnormal finding of lung field: Secondary | ICD-10-CM | POA: Diagnosis not present

## 2024-02-09 DIAGNOSIS — J181 Lobar pneumonia, unspecified organism: Secondary | ICD-10-CM | POA: Insufficient documentation

## 2024-02-09 DIAGNOSIS — I517 Cardiomegaly: Secondary | ICD-10-CM | POA: Diagnosis not present

## 2024-02-09 DIAGNOSIS — Z95810 Presence of automatic (implantable) cardiac defibrillator: Secondary | ICD-10-CM | POA: Diagnosis not present

## 2024-02-09 DIAGNOSIS — J189 Pneumonia, unspecified organism: Secondary | ICD-10-CM

## 2024-02-09 DIAGNOSIS — R0602 Shortness of breath: Secondary | ICD-10-CM | POA: Diagnosis not present

## 2024-02-09 LAB — BASIC METABOLIC PANEL WITH GFR
Anion gap: 12 (ref 5–15)
BUN: 30 mg/dL — ABNORMAL HIGH (ref 8–23)
CO2: 23 mmol/L (ref 22–32)
Calcium: 8.8 mg/dL — ABNORMAL LOW (ref 8.9–10.3)
Chloride: 100 mmol/L (ref 98–111)
Creatinine, Ser: 1.33 mg/dL — ABNORMAL HIGH (ref 0.61–1.24)
GFR, Estimated: 59 mL/min — ABNORMAL LOW (ref >60)
Glucose, Bld: 122 mg/dL — ABNORMAL HIGH (ref 70–99)
Potassium: 4 mmol/L (ref 3.5–5.1)
Sodium: 135 mmol/L (ref 135–145)

## 2024-02-09 LAB — CBC
HCT: 40.2 % (ref 39.0–52.0)
Hemoglobin: 13 g/dL (ref 13.0–17.0)
MCH: 27.1 pg (ref 26.0–34.0)
MCHC: 32.3 g/dL (ref 30.0–36.0)
MCV: 83.9 fL (ref 80.0–100.0)
Platelets: 311 K/uL (ref 150–400)
RBC: 4.79 MIL/uL (ref 4.22–5.81)
RDW: 16.5 % — ABNORMAL HIGH (ref 11.5–15.5)
WBC: 8 K/uL (ref 4.0–10.5)
nRBC: 0 % (ref 0.0–0.2)

## 2024-02-09 LAB — BRAIN NATRIURETIC PEPTIDE: B Natriuretic Peptide: 3386.7 pg/mL — ABNORMAL HIGH (ref 0.0–100.0)

## 2024-02-09 LAB — TROPONIN I (HIGH SENSITIVITY)
Troponin I (High Sensitivity): 22 ng/L — ABNORMAL HIGH (ref <18)
Troponin I (High Sensitivity): 24 ng/L — ABNORMAL HIGH (ref <18)

## 2024-02-09 MED ORDER — FUROSEMIDE 10 MG/ML IJ SOLN
40.0000 mg | Freq: Once | INTRAMUSCULAR | Status: AC
  Administered 2024-02-09: 40 mg via INTRAVENOUS
  Filled 2024-02-09: qty 4

## 2024-02-09 NOTE — ED Triage Notes (Signed)
 Pt to ED via POV from [facility]. Pt reports SOB and cough that started on October 10th. Pt reports is now having bilateral leg swelling and chest tightness.

## 2024-02-09 NOTE — ED Provider Notes (Signed)
 [facility] Provider Note    Event Date/Time   First MD Initiated Contact with Patient 02/09/24 2029     (approximate)   History   Shortness of Breath (/) and Nasal Congestion   HPI  Jerry Howe is a 68 year old male with history of CHF presenting to the emergency department for evaluation of cough and lower extremity swelling.  Patient reports he has had an ongoing issue with a cough for several weeks.  Reports he is on 1 antibiotic but it caused significant stomach upset so he stopped taking this after 4 days.  He additionally reports he has been having ongoing issues with leg swelling but denies shortness of breath or chest pain to me.  Has been taking torsemide  every other day as prescribed from his heart failure team. Reports that after arriving to the emergency department, he learned that a prescription for doxycycline  and Tessalon  Perles has been sent for him already to his pharmacy.  Reviewed his primary care visit from 02/01/2024.  At that time patient was on a Z-Pak.  Chest x-Thatiana Renbarger as well as blood work was ordered.  Lower extremity swelling was thought to likely be related to heart failure exacerbation, continued on torsemide .       Physical Exam   Triage Vital Signs: ED Triage Vitals [02/09/24 1816]  Encounter Vitals Group     BP (!) 147/90     Girls Systolic BP Percentile      Girls Diastolic BP Percentile      Boys Systolic BP Percentile      Boys Diastolic BP Percentile      Pulse Rate 79     Resp 16     Temp 98 F (36.7 C)     Temp Source Oral     SpO2 100 %     Weight      Height      Head Circumference      Peak Flow      Pain Score      Pain Loc      Pain Education      Exclude from Growth Chart     Most recent vital signs: Vitals:   02/09/24 2220 02/09/24 2235  BP: 115/85 109/82  Pulse:    Resp: 15 18  Temp:    SpO2:       General: Awake, interactive  CV:  Good peripheral perfusion Resp:  Unlabored  respirations, lungs overall clear to auscultation without appreciable crackles Abd:  Nondistended.  Neuro:  Symmetric facial movement, fluid speech Other:   Moderate bilateral lower extremity edema noted   ED Results / Procedures / Treatments   Labs (all labs ordered are listed, but only abnormal results are displayed) Labs Reviewed  BASIC METABOLIC PANEL WITH GFR - Abnormal; Notable for the following components:      Result Value   Glucose, Bld 122 (*)    BUN 30 (*)    Creatinine, Ser 1.33 (*)    Calcium  8.8 (*)    GFR, Estimated 59 (*)    All other components within normal limits  CBC - Abnormal; Notable for the following components:   RDW 16.5 (*)    All other components within normal limits  BRAIN NATRIURETIC PEPTIDE - Abnormal; Notable for the following components:   B Natriuretic Peptide 3,386.7 (*)    All other components within normal limits  TROPONIN I (HIGH SENSITIVITY) - Abnormal; Notable for the following components:   Troponin I (  High Sensitivity) 22 (*)    All other components within normal limits  TROPONIN I (HIGH SENSITIVITY) - Abnormal; Notable for the following components:   Troponin I (High Sensitivity) 24 (*)    All other components within normal limits     EKG EKG independently reviewed and interpreted by myself demonstrates:  EKG demonstrates sinus rhythm at a rate of 80, PR 176, QRS 120, QTc 475, nonspecific ST changes  RADIOLOGY Imaging independently reviewed and interpreted by myself demonstrates:  CXR with right lower lobe pneumonia  Formal Radiology Read:  DG Chest 2 View Result Date: 02/09/2024 CLINICAL DATA:  Shortness of breath. EXAM: CHEST - 2 VIEW COMPARISON:  Chest radiograph dated 11/19/2023. FINDINGS: Right lower lobe opacity, improved since the prior radiograph and may represent residual or recurrent infiltrate. Continued follow-up recommended. The left lung is clear. No pleural effusion pneumothorax. Stable cardiomegaly. Left pectoral  AICD device. No acute osseous pathology. IMPRESSION: Right lower lobe infiltrate. Continued follow-up recommended. Electronically Signed   By: Vanetta Chou M.D.   On: 02/09/2024 18:39    PROCEDURES:  Critical Care performed: No  Procedures   MEDICATIONS ORDERED IN ED: Medications  furosemide  (LASIX ) injection 40 mg (40 mg Intravenous Given 02/09/24 2234)     IMPRESSION / MDM / ASSESSMENT AND PLAN / ED COURSE  I reviewed the triage vital signs and the nursing notes.  Differential diagnosis includes, but is not limited to, pneumonia, CHF exacerbation, Viral illness, anemia electrolyte abnormality  Patient's presentation is most consistent with acute presentation with potential threat to life or bodily function.  68 year old male presenting to the emergency department for evaluation of ongoing cough and leg swelling.  Stable vitals on presentation.  Labs with reassuring CBC, CMP with mild renal impairment not significant change from prior.  Troponin minimally elevated, stable on repeat with prior troponin elevation.  Low suspicion ACS.  BNP is significantly elevated.  Does have lower extremity swelling on exam though he denies significant shortness of breath.  Chest x-Jerry Howe concerning for pneumonia with right sided focal consolidation.  I discussed the results of his workup and did consider and offer admission.  Patient reports that he feels like his breathing is not significantly worsened, would like to trial outpatient diuresis and antibiotics.  He is not hypoxic and [name] is without significant pulmonary edema.  Do think this is reasonable.  Has a prescription for doxycycline  and Tessalon  Perles already sent.  I did give him a dose of IV Lasix  here.  He does follow with the heart failure team, I did recommend he reach out to them for further guidance on management of his lower extremity swelling which I suspect is related to CHF exacerbation.  I did suggest possibly taking torsemide  for the  next 2 days for further diuresis but I placed a referral to the heart failure team for further management.,  Strict return precautions were provided.  Patient was discharged in stable condition.      FINAL CLINICAL IMPRESSION(S) / ED DIAGNOSES   Final diagnoses:  Pneumonia of right lower lobe due to infectious organism  Swelling of lower extremity     Rx / DC Orders   ED Discharge Orders          Ordered    AMB referral to CHF clinic       Comments: CHF exacerbation seen in ER, not hypoxic but likely need outpatient med changes   02/09/24 2328  Note:  This document was prepared using Dragon voice recognition software and may include unintentional dictation errors.   Levander Slate, MD 02/09/24 [id number]

## 2024-02-09 NOTE — Discharge Instructions (Signed)
 You were seen in the emergency room today for evaluation of your cough and shortness of breath.  Your x-Jerry Howe did show signs of a pneumonia.  Please take the antibiotics that you were sent as directed.  I suspect your leg swelling is related to your heart failure.  You are given a dose of Lasix  here.  You can try taking your torsemide  for the next 2 days consecutively.  Please also follow-up with your heart failure team for further evaluation as well as your primary care doctor.  Return to the ER for new or worsening symptoms.

## 2024-02-15 ENCOUNTER — Ambulatory Visit: Admitting: Cardiology

## 2024-02-15 ENCOUNTER — Telehealth: Payer: Self-pay | Admitting: Family

## 2024-02-15 NOTE — Telephone Encounter (Signed)
 Returned pt's call. Pt states he fell asleep in a chair with his feet on the floor the past two nights, and woke up with swollen feet. He states the swelling goes down when he elevates his feet but starts coming back when he stays up walking around. He states he is taking his 10 MG torsemide .   Provider, Ellouise Class, notified. Advised the patient to wear his compression hose during the day, and remove before bedtime, and elevate feet while sleeping. Advised the patient to call the clinic back if his symptoms worsen or do not improve. Pt has appointment in office next week.  Pt was agreeable, and verbalized understanding.

## 2024-02-22 ENCOUNTER — Telehealth: Payer: Self-pay | Admitting: Family

## 2024-02-22 NOTE — Progress Notes (Unsigned)
 PCP: Sherial Bail, MD Cardiology: Dr. Perla  68 y.o. with history of paroxysmal atrial fibrillation, nonischemic cardiomyopathy with Medtronic ICD, CKD stage 3, and PVCs returns for followup of CHF.  HF was diagnosed in 2016, LHC at that time revealed minimal CAD.  LVEF was 25%.  He was followed by [facility] clinic and then transitioned to [facility] heart care.  He established with Dr. Gollan on 12/2021 and Dr. Cindie on 01/2022.  He reported NYHA Class II symptoms and lisinopril  was switched to Entresto .      He presented to the ED  10/22 after his defibrillator fired 6 times   In the ED he was noted to be in atrial fibrillation. ICD interogation showed inappropriate ICD firing for AF. Started on amiodarone . Also found to have frequent PVCs.   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      Had AF ablation on 05/24/22. TEE at that time EF 20-25%, mild to moderate MR.   Patient was admitted in 8/25 with severe PNA.  Entresto , spironolactone , and Toprol  XL were stopped due to low blood pressure.  Echo in 8/25 showed EF < 20%, severe LV dilation, moderate RV dysfunction, mild-moderate MR.   CPX in 8/25 showed severe functional limitation due to heart failure.   Patient returns for followup of CHF.  After last appointment, TSH was found to be low but patient never returned for followup labs. He is back to work as a arboriculturist.  He gets fullness in his abdomen at times and will take torsemide .  He thinks he takes torsemide  once or twice a week and wants to know if he could just take it regularly.  At last appointment, he was told to sequentially start back on Entresto  then spironolactone  then Toprol  XL.  He says he is taking them all but does seem a little confused about his medication regimen. SBP 90s-100s on home readings.  No lightheadedness.  Despite poor CPX recently, he says that he does not get short of breath walking on flat ground or up a flight of stairs.  He washed his car last weekend with  mild fatigue.  His wife thinks he may be under-reporting his symptoms.  Weight is up about 5 lbs.   Medtronic device interrogation: No VT, short AF episodes (last in 8/25).   ECG (personally reviewed): NSR, PVCs, IVCD with QRS 126 msec  Labs (8/25): K 3.7, creatinine 1.3 => 1.37, BNP 300, TSH 0.08 (low), LFTs normal.   PMH: 1. Chronic systolic CHF: Nonischemic cardiomyopathy.  Medtronic ICD.  - Echo (8/16): EF 25-30%. - LHC (2016): No significant CAD.  - Echo (10/23): EF 20-25% - LHC (10/23): Mild nonobstructive CAD - Echo (8/25): EF < 20%, severe LV dilation, moderate RV dysfunction, mild-moderate MR.  - CPX (8/25): Peak VO2 8.7, RER 1.1, VE/VCO2 slope 58. Severe functional limitation due to HF.  2. Atrial fibrillation: Paroxysmal.  Ablation in 2/24.  3. CKD stage 3 4. PVCs/NSVT 5. HTN 6. Nephrolithiasis  ROS: All systems reviewed and negative except as per HPI.   Social History   Socioeconomic History   Marital status: Married    Spouse name: Suzen   Number of children: 5   Years of education: Not on file   Highest education level: Not on file  Occupational History   Not on file  Tobacco Use   Smoking status: Never   Smokeless tobacco: Never   Tobacco comments:    Never smoke 06/21/22  Vaping Use   Vaping  status: Never Used  Substance and Sexual Activity   Alcohol use: No   Drug use: Not Currently   Sexual activity: Yes    Partners: Female  Other Topics Concern   Not on file  Social History Narrative   Lives at home with spouse; 5 kids, 11 grandkids & 2 great grandkids   Social Drivers of Corporate Investment Banker Strain: Low Risk  (02/01/2024)   Received from [facility]   Overall Financial Resource Strain (CARDIA)    Difficulty of Paying Living Expenses: Not hard at all  Food Insecurity: No Food Insecurity (02/01/2024)   Received from [facility]   Hunger Vital Sign    Within the past 12 months, you worried that  your food would run out before you got the money to buy more.: Never true    Within the past 12 months, the food you bought just didn't last and you didn't have money to get more.: Never true  Transportation Needs: No Transportation Needs (02/01/2024)   Received from [facility] - Transportation    In the past 12 months, has lack of transportation kept you from medical appointments or from getting medications?: No    Lack of Transportation (Non-Medical): No  Physical Activity: Not on file  Stress: Not on file  Social Connections: Socially Integrated (11/19/2023)   Social Connection and Isolation Panel    Frequency of Communication with Friends and Family: More than three times a week    Frequency of Social Gatherings with Friends and Family: More than three times a week    Attends Religious Services: More than 4 times per year    Active Member of [employer] or Organizations: Yes    Attends Banker Meetings: 1 to 4 times per year    Marital Status: Married  Catering Manager Violence: Not At Risk (11/19/2023)   Humiliation, Afraid, Rape, and Kick questionnaire    Fear of Current or Ex-Partner: No    Emotionally Abused: No    Physically Abused: No    Sexually Abused: No   Family History  Problem Relation Age of Onset   Hyperlipidemia Mother    Hypertension Father    Heart disease Father    Hyperlipidemia Brother    Diabetes Brother    Heart failure Neg Hx     Current Outpatient Medications  Medication Sig Dispense Refill   albuterol  (PROVENTIL  HFA;VENTOLIN  HFA) 108 (90 Base) MCG/ACT inhaler Inhale 1 puff into the lungs every 6 (six) hours as needed for wheezing or shortness of breath.     amiodarone  (PACERONE ) 100 MG tablet Take 1 tablet (100 mg total) by mouth daily. 90 tablet 3   apixaban  (ELIQUIS ) 5 MG TABS tablet Take 1 tablet (5 mg total) by mouth 2 (two) times daily. 180 tablet 3   Ascorbic Acid  (VITAMIN C ) 1000 MG tablet Take 1,000 mg by  mouth daily.     Cholecalciferol 125 MCG (5000 UT) CHEW 1 capsule.     cyanocobalamin  2000 MCG tablet Take 2,000 mcg by mouth daily.     digoxin  (LANOXIN ) 0.125 MG tablet Take 1 tablet (0.125 mg total) by mouth daily. 30 tablet 5   empagliflozin  (JARDIANCE ) 10 MG TABS tablet Take 1 tablet (10 mg total) by mouth daily. 90 tablet 3   fluticasone  (FLONASE ) 50 MCG/ACT nasal spray Place 1 spray into both nostrils in the morning and at bedtime.     hydrOXYzine (ATARAX) 25  MG tablet Take 25 mg by mouth every 8 (eight) hours as needed for anxiety.     metoprolol  succinate (TOPROL -XL) 25 MG 24 hr tablet Take 0.5 tablets (12.5 mg total) by mouth daily. Take with or immediately following a meal. 45 tablet 3   Multiple Vitamins-Minerals (SUPER THERA VITE M PO) Take 1 tablet by mouth daily.     Naphazoline-Glycerin  (REDNESS RELIEF OP) Place 1 drop into both eyes 2 (two) times daily.     omeprazole (PRILOSEC) 20 MG capsule Take 20 mg by mouth daily.     pyridoxine  (B-6) 500 MG tablet Take 500 mg by mouth daily.     rosuvastatin  (CRESTOR ) 10 MG tablet Take 1 tablet (10 mg total) by mouth daily. 90 tablet 3   sacubitril -valsartan  (ENTRESTO ) 49-51 MG Take 1 tablet by mouth 2 (two) times daily.     sertraline (ZOLOFT) 25 MG tablet Take 25 mg by mouth daily.     spironolactone  (ALDACTONE ) 25 MG tablet Take 1 tablet (25 mg total) by mouth daily. 90 tablet 3   torsemide  (DEMADEX ) 10 MG tablet Take 1 tablet (10 mg total) by mouth daily. 30 tablet 5   Vitamin E 180 MG (400 UNIT) CAPS 1 capsule.     Zinc Gluconate 100 MG TABS 1 tablet Orally Once a day for health maintenance     No current facility-administered medications for this visit.   There were no vitals taken for this visit. General: NAD Neck: No JVD, no thyromegaly or thyroid  nodule.  Lungs: Clear to auscultation bilaterally with normal respiratory effort. CV: Nondisplaced PMI.  Heart regular S1/S2, no S3/S4, no murmur.  No peripheral edema.  No carotid  bruit.  Normal pedal pulses.  Abdomen: Soft, nontender, no hepatosplenomegaly, no distention.  Skin: Intact without lesions or rashes.  Neurologic: Alert and oriented x 3.  Psych: Normal affect. Extremities: No clubbing or cyanosis.  HEENT: Normal.   Assessment/Plan: 1. Chronic systolic CHF: Nonischemic cardiomyopathy.  Medtronic ICD.  Caths in 2016 and 10/23 with minimal CAD.  Echo in 8/16 with EF 25-30%.  Echo in 8/25 with EF < 20%, severe LV dilation, moderate RV dysfunction, mild-moderate MR.  CPX in 9/25 showed severe functional limitation due to heart failure.  CPX is significantly out of proportion to his symptoms, he describes NYHA class II symptoms at most (?under-reporting).  He is not volume overloaded by exam. SBP 90s-100s at home, no BP room to titrate meds.  - Continue Jardiance  10 mg daily.  - Continue Entresto  49/51 bid today.  - Continue spironolactone  25 mg daily.  - Continue Toprol  XL 25 mg daily.  - He can take torsemide  10 mg every other day.  BMET/BNP today and BMET in 10 days.  - Start digoxin  0.125 daily.  - CPX concerns me that patient is nearing the need for advanced therapies though he reports minimal symptoms. He is generally healthy other than his heart problems with a supportive wife.  I think he would be a candidate for transplant or for LVAD. We discussed options today.  Unfortunately, he did not return to get workup of hyperthyroidism after last labs.  We will get full thyroid  labs today and start treatment for hyperthyroidism based on results. Once hyperthyroidism treatment is underway, he will need RHC to assess filling pressures and cardiac output.  I will set him up to see Dr. Cherrie in about a month to arrange this.  2. Atrial fibrillation: Paroxysmal.  S/p ablation in 2/24.  NSR today.  -  With suspected hyperthyroidism, I will decrease amiodarone  to 100 mg daily, may need to stop eventually.   - Continue apixaban .  3. Hyperthyroidism: TSH was 0.08 in  8/25.  Suspect hyperthyroidism was triggered by amiodarone .  - I will send TSH, free T3, and free T4 today.  - If labs today confirm hyperthyroidism, start methimazole and refer to endocrinology.  - Decrease amiodarone  to 100 mg daily and would favor trying to stop this in the future.  4. PVCs/NSVT: Has ICD, on amiodarone .   Followup 1 month with Dr. Bensimhon.   I spent 42 minutes reviewing records, interviewing/examining patient, and managing orders.   Ellouise DELENA Class 02/22/2024

## 2024-02-22 NOTE — Telephone Encounter (Signed)
 Called to confirm/remind patient of their appointment at the [facility] on 02/23/24.   Appointment:   [x] Confirmed  [] Left mess   [] No answer/No voice mail  [] VM Full/unable to leave message  [] Phone not in service  Patient reminded to bring all medications and/or complete list.  Confirmed patient has transportation. Gave directions, instructed to utilize valet parking.

## 2024-02-23 ENCOUNTER — Other Ambulatory Visit
Admission: RE | Admit: 2024-02-23 | Discharge: 2024-02-23 | Disposition: A | Discharge status: Home / Self Care | Source: Ambulatory Visit | Attending: Family | Admitting: Family

## 2024-02-23 ENCOUNTER — Ambulatory Visit: Payer: Self-pay | Admitting: Family

## 2024-02-23 ENCOUNTER — Encounter: Payer: Self-pay | Admitting: Family

## 2024-02-23 ENCOUNTER — Ambulatory Visit (HOSPITAL_BASED_OUTPATIENT_CLINIC_OR_DEPARTMENT_OTHER): Admitting: Family

## 2024-02-23 VITALS — BP 113/74 | Wt 190.0 lb

## 2024-02-23 DIAGNOSIS — I493 Ventricular premature depolarization: Secondary | ICD-10-CM | POA: Diagnosis not present

## 2024-02-23 DIAGNOSIS — I480 Paroxysmal atrial fibrillation: Secondary | ICD-10-CM | POA: Diagnosis not present

## 2024-02-23 DIAGNOSIS — I5022 Chronic systolic (congestive) heart failure: Secondary | ICD-10-CM | POA: Insufficient documentation

## 2024-02-23 DIAGNOSIS — G4733 Obstructive sleep apnea (adult) (pediatric): Secondary | ICD-10-CM | POA: Diagnosis not present

## 2024-02-23 DIAGNOSIS — E0590 Thyrotoxicosis, unspecified without thyrotoxic crisis or storm: Secondary | ICD-10-CM

## 2024-02-23 LAB — BASIC METABOLIC PANEL WITH GFR
Anion gap: 10 (ref 5–15)
BUN: 21 mg/dL (ref 8–23)
CO2: 27 mmol/L (ref 22–32)
Calcium: 9.1 mg/dL (ref 8.9–10.3)
Chloride: 101 mmol/L (ref 98–111)
Creatinine, Ser: 1.17 mg/dL (ref 0.61–1.24)
GFR, Estimated: >60 mL/min (ref >60)
Glucose, Bld: 113 mg/dL — ABNORMAL HIGH (ref 70–99)
Potassium: 4.3 mmol/L (ref 3.5–5.1)
Sodium: 138 mmol/L (ref 135–145)

## 2024-02-23 LAB — BRAIN NATRIURETIC PEPTIDE: B Natriuretic Peptide: 2171.9 pg/mL — ABNORMAL HIGH (ref 0.0–100.0)

## 2024-02-23 LAB — DIGOXIN LEVEL: Digoxin Level: <0.6 ng/mL — ABNORMAL LOW (ref 0.8–2.0)

## 2024-02-23 NOTE — Patient Instructions (Signed)
 Medication Changes:  TAKE TORSEMIDE  20mg  (2 tabs) Thursday, Friday, and Saturday. BEGIN TORSEMIDE  10mg  (1 tab) daily STARTING SUNDAY.  Lab Work:  Go over to the [facility]. Go pass the gift shop and have your blood work completed.    We will only call you if the results are abnormal or if the provider would like to make medication changes.  No news is good news.   SPECIAL INSTRUCTIONS: Please reach out to Dr. Wilbert Turner's office in order to follow up with the sleep study. The number is [phone].   Follow-Up in: Please follow up with the [facility] in 2 weeks with Ellouise Class, FNP.   Thank you for choosing [facility].    At the [facility], you and your health needs are our priority. We have a designated team specialized in the treatment of Heart Failure. This Care Team includes your primary Heart Failure Specialized Cardiologist (physician), Advanced Practice Providers (APPs- Physician Assistants and Nurse Practitioners), and Pharmacist who all work together to provide you with the care you need, when you need it.   You may see any of the following providers on your designated Care Team at your next follow up:  Dr. Toribio Fuel Dr. Ezra Shuck Dr. Ria Commander Dr. Morene Brownie Ellouise Class, FNP Jaun Bash, RPH-CPP  Please be sure to bring in all your medications bottles to every appointment.   Need to Contact Us :  If you have any questions or concerns before your next appointment please send us  a message through [place] or call our office at [phone].    TO LEAVE A MESSAGE FOR THE NURSE SELECT OPTION 2, PLEASE LEAVE A MESSAGE INCLUDING: YOUR NAME DATE OF BIRTH CALL BACK NUMBER REASON FOR CALL**this is important as we prioritize the call backs  YOU WILL RECEIVE A CALL BACK THE SAME DAY AS LONG AS YOU CALL BEFORE 4:00 PM

## 2024-02-24 MED ORDER — TORSEMIDE 20 MG PO TABS: 20.0000 mg | ORAL_TABLET | Freq: Every day | ORAL | 1 refills | Status: DC

## 2024-02-24 NOTE — Telephone Encounter (Signed)
 Spoke to pt. Pt agreeable to medication changes. New orders placed. No further questions at this time.

## 2024-03-07 DIAGNOSIS — J189 Pneumonia, unspecified organism: Secondary | ICD-10-CM | POA: Diagnosis not present

## 2024-03-07 DIAGNOSIS — I480 Paroxysmal atrial fibrillation: Secondary | ICD-10-CM | POA: Diagnosis not present

## 2024-03-07 DIAGNOSIS — E119 Type 2 diabetes mellitus without complications: Secondary | ICD-10-CM | POA: Diagnosis not present

## 2024-03-07 DIAGNOSIS — Z0000 Encounter for general adult medical examination without abnormal findings: Secondary | ICD-10-CM | POA: Diagnosis not present

## 2024-03-07 DIAGNOSIS — I428 Other cardiomyopathies: Secondary | ICD-10-CM | POA: Diagnosis not present

## 2024-03-07 DIAGNOSIS — K2970 Gastritis, unspecified, without bleeding: Secondary | ICD-10-CM | POA: Diagnosis not present

## 2024-03-07 DIAGNOSIS — Z1331 Encounter for screening for depression: Secondary | ICD-10-CM | POA: Diagnosis not present

## 2024-03-07 DIAGNOSIS — I5022 Chronic systolic (congestive) heart failure: Secondary | ICD-10-CM | POA: Diagnosis not present

## 2024-03-09 ENCOUNTER — Telehealth: Payer: Self-pay | Admitting: Family

## 2024-03-09 NOTE — Telephone Encounter (Signed)
 Called to confirm/remind patient of their appointment at the [facility] on 03/12/24.   Appointment:   [x] Confirmed  [] Left mess   [] No answer/No voice mail  [] VM Full/unable to leave message  [] Phone not in service  Patient reminded to bring all medications and/or complete list.  Confirmed patient has transportation. Gave directions, instructed to utilize valet parking.

## 2024-03-11 NOTE — Progress Notes (Unsigned)
 PCP: Sherial Bail, MD Cardiology: Dr. Perla   Chief Complaint: fatigue   HPI:   Jerry Howe is a 68 y.o.  male with a history of paroxysmal atrial fibrillation, nonischemic cardiomyopathy with Medtronic ICD, CKD stage 3, and PVCs returns for followup of CHF.  HF was diagnosed in 2016, LHC at that time revealed minimal CAD.  LVEF was 25%.  He was followed by [facility] clinic and then transitioned to [facility] heart care.  He established with Dr. Gollan on 12/2021 and Dr. Cindie on 01/2022.  He reported NYHA Class II symptoms and lisinopril  was switched to Entresto .      He presented to the ED 10/22 after his defibrillator fired 6 times In the ED he was noted to be in atrial fibrillation. ICD interogation showed inappropriate ICD firing for AF. Started on amiodarone . Also found to have frequent PVCs.   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      Had AF ablation on 05/24/22. TEE at that time EF 20-25%, mild to moderate Jerry.   Patient was admitted in 8/25 with severe PNA.  Entresto , spironolactone , and Toprol  XL were stopped due to low blood pressure.  Echo in 8/25 showed EF < 20%, severe LV dilation, moderate RV dysfunction, mild-moderate Jerry.   CPX in 8/25 showed severe functional limitation due to heart failure.   Seen in [facility] 01/04/24 where there was some confusion with patient regarding his medications. Digoxin  0.125mg  daiy was started. Torsemide  every other day. Amiodarone  decreased.   Was in the ED 02/09/24 with cough/ pedal edema. CXR showed pneumonia. BNP elevated and 1 dose of IV lasix  given.   Seen in [facility] 02/23/24 fluid up and torsemide  dose increased. After lab results obtained, torsemide  increased even further.   Patient presents today, with his wife, for HF follow-up visit with a chief complaint of moderate fatigue. Still has some edema but says that it's much better. Having some issues with constipation. Has had his reflux medication changed and says that his breathing has  improved and his appetite is slowly improving as well. Currently taking torsemide  20mg  daily but has taken an extra 10mg  PRN for feeling SOB.   Medtronic device interrogation: Optivol level declining from last check 2 weeks ago. Still a little elevated but trending down. 100% VS, <0.1% VP, no shocks  ECG not done  Labs (8/25): K 3.7, creatinine 1.3 => 1.37, BNP 300, TSH 0.08 (low), LFTs normal.  Labs (9/25): K 4.0, creatinine 1.17, BNP 721.8, TSH 0.261 Labs (10/25): K 4.0, creatinine 1.33, BNP 3386.7, HS trop 22, Hg 13 Labs (11/25): K 4.3, creatinine 1,17  PMH: 1. Chronic systolic CHF: Nonischemic cardiomyopathy.  Medtronic ICD.  - Echo (8/16): EF 25-30%. - LHC (2016): No significant CAD.  - Echo (10/23): EF 20-25% - LHC (10/23): Mild nonobstructive CAD - Echo (8/25): EF < 20%, severe LV dilation, moderate RV dysfunction, mild-moderate Jerry.  - CPX (8/25): Peak VO2 8.7, RER 1.1, VE/VCO2 slope 58. Severe functional limitation due to HF.  2. Atrial fibrillation: Paroxysmal.  Ablation in 2/24.  3. CKD stage 3 4. PVCs/NSVT 5. HTN 6. Nephrolithiasis  ROS: All systems reviewed and negative except as per HPI.   Social History   Socioeconomic History   Marital status: Married    Spouse name: Suzen   Number of children: 5   Years of education: Not on file   Highest education level: Not on file  Occupational History   Not on file  Tobacco Use  Smoking status: Never   Smokeless tobacco: Never   Tobacco comments:    Never smoke 06/21/22  Vaping Use   Vaping status: Never Used  Substance and Sexual Activity   Alcohol use: No   Drug use: Not Currently   Sexual activity: Yes    Partners: Female  Other Topics Concern   Not on file  Social History Narrative   Lives at home with spouse; 5 kids, 11 grandkids & 2 great grandkids   Social Drivers of Corporate Investment Banker Strain: Low Risk  (03/07/2024)   Received from [facility]   Overall Financial  Resource Strain (CARDIA)    Difficulty of Paying Living Expenses: Not hard at all  Food Insecurity: No Food Insecurity (03/07/2024)   Received from [facility]   Hunger Vital Sign    Within the past 12 months, you worried that your food would run out before you got the money to buy more.: Never true    Within the past 12 months, the food you bought just didn't last and you didn't have money to get more.: Never true  Transportation Needs: No Transportation Needs (03/07/2024)   Received from [facility] - Transportation    In the past 12 months, has lack of transportation kept you from medical appointments or from getting medications?: No    Lack of Transportation (Non-Medical): No  Physical Activity: Not on file  Stress: Not on file  Social Connections: Socially Integrated (11/19/2023)   Social Connection and Isolation Panel    Frequency of Communication with Friends and Family: More than three times a week    Frequency of Social Gatherings with Friends and Family: More than three times a week    Attends Religious Services: More than 4 times per year    Active Member of [employer] or Organizations: Yes    Attends Banker Meetings: 1 to 4 times per year    Marital Status: Married  Catering Manager Violence: Not At Risk (11/19/2023)   Humiliation, Afraid, Rape, and Kick questionnaire    Fear of Current or Ex-Partner: No    Emotionally Abused: No    Physically Abused: No    Sexually Abused: No   Family History  Problem Relation Age of Onset   Hyperlipidemia Mother    Hypertension Father    Heart disease Father    Hyperlipidemia Brother    Diabetes Brother    Heart failure Neg Hx     Current Outpatient Medications  Medication Sig Dispense Refill   albuterol  (PROVENTIL  HFA;VENTOLIN  HFA) 108 (90 Base) MCG/ACT inhaler Inhale 1 puff into the lungs every 6 (six) hours as needed for wheezing or shortness of breath.     amiodarone   (PACERONE ) 100 MG tablet Take 1 tablet (100 mg total) by mouth daily. 90 tablet 3   apixaban  (ELIQUIS ) 5 MG TABS tablet Take 1 tablet (5 mg total) by mouth 2 (two) times daily. 180 tablet 3   Ascorbic Acid  (VITAMIN C ) 1000 MG tablet Take 1,000 mg by mouth daily.     Cholecalciferol 125 MCG (5000 UT) CHEW 1 capsule.     cyanocobalamin  2000 MCG tablet Take 2,000 mcg by mouth daily.     digoxin  (LANOXIN ) 0.125 MG tablet Take 1 tablet (0.125 mg total) by mouth daily. 30 tablet 5   empagliflozin  (JARDIANCE ) 10 MG TABS tablet Take 1 tablet (10 mg total) by mouth daily. 90 tablet 3   fluticasone  (  FLONASE ) 50 MCG/ACT nasal spray Place 1 spray into both nostrils in the morning and at bedtime.     hydrOXYzine (ATARAX) 25 MG tablet Take 25 mg by mouth every 8 (eight) hours as needed for anxiety.     metoprolol  succinate (TOPROL -XL) 25 MG 24 hr tablet Take 0.5 tablets (12.5 mg total) by mouth daily. Take with or immediately following a meal. 45 tablet 3   Multiple Vitamins-Minerals (SUPER THERA VITE M PO) Take 1 tablet by mouth daily.     Naphazoline-Glycerin  (REDNESS RELIEF OP) Place 1 drop into both eyes 2 (two) times daily.     omeprazole (PRILOSEC) 20 MG capsule Take 20 mg by mouth daily.     pyridoxine  (B-6) 500 MG tablet Take 500 mg by mouth daily.     rosuvastatin  (CRESTOR ) 10 MG tablet Take 1 tablet (10 mg total) by mouth daily. 90 tablet 3   sacubitril -valsartan  (ENTRESTO ) 49-51 MG Take 1 tablet by mouth 2 (two) times daily.     sertraline (ZOLOFT) 25 MG tablet Take 25 mg by mouth daily.     spironolactone  (ALDACTONE ) 25 MG tablet Take 1 tablet (25 mg total) by mouth daily. 90 tablet 3   torsemide  (DEMADEX ) 20 MG tablet Take 1 tablet (20 mg total) by mouth daily. 90 tablet 1   Vitamin E 180 MG (400 UNIT) CAPS 1 capsule.     Zinc Gluconate 100 MG TABS 1 tablet Orally Once a day for health maintenance     No current facility-administered medications for this visit.   Vitals:   03/12/24 0933   BP: 101/71  Pulse: 63  SpO2: 99%  Weight: 183 lb 4 oz (83.1 kg)   Wt Readings from Last 3 Encounters:  03/12/24 183 lb 4 oz (83.1 kg)  02/23/24 190 lb (86.2 kg)  01/04/24 181 lb 9.6 oz (82.4 kg)   Lab Results  Component Value Date   CREATININE 1.17 02/23/2024   CREATININE 1.33 (H) 02/09/2024   CREATININE 1.17 01/04/2024    Physical Exam:  General: Well appearing.  Cor: No JVD. Regular rhythm, rate.  Lungs: clear Abdomen: soft, nontender, nondistended. Extremities: trace pitting edema BLE Neuro:. Affect pleasant   Assessment/Plan: 1. Chronic systolic CHF: Nonischemic cardiomyopathy.  Medtronic ICD.  Caths in 2016 and 10/23 with minimal CAD.  Echo in 8/16 with EF 25-30%.  Echo in 8/25 with EF < 20%, severe LV dilation, moderate RV dysfunction, mild-moderate Jerry.  CPX in 9/25 showed severe functional limitation due to heart failure.  CPX is significantly out of proportion to his symptoms, he describes NYHA class II symptoms at most (?under-reporting). Appears euvolemic today with declining optivol reading - BNP 02/09/24 was 3386.7. BMET, BNP today. Depending on lab results, may increase torsemide  a bit further since optivol is still elevated - Weight down 7 pounds since last here 2 weeks ago - Continue compression socks - Continue digoxin  0.125mg  daily. Dig level 02/23/24 was <0.6 - Continue Jardiance  10 mg daily.  - Continue Toprol  XL 12.5 mg daily.  - Continue Entresto  49/51 bid today.  - Continue spironolactone  25 mg daily.  - Continue torsemide  20 mg daily.  - Over the Thanksgiving weekend, if he has na overnight weight gain of >2 pounds, he can take additional 10mg  torsemide . - CPX concerning that patient is nearing the need for advanced therapies though he reports minimal symptoms. He is generally healthy other than his heart problems with a supportive wife.  I think he would be a candidate for  transplant or for LVAD. Once hyperthyroidism treatment is underway, he will need  RHC to assess filling pressures and cardiac output.   - he can take OTC colace/ miralax  for constipation.  2. Atrial fibrillation: Paroxysmal.  S/p ablation in 2/24.   - With suspected hyperthyroidism, amiodarone  had been previously decreased to 100 mg daily, may need to stop eventually.   - Continue apixaban  5mg  BID 3. Hyperthyroidism: TSH was 0.08 in 8/25.  TSH 0.261 9/25. Suspect hyperthyroidism was triggered by amiodarone .  - Referral to endocrinology placed at last visit but they say that they haven't heard anything. RN will follow up with endocrinology office.   - Amiodarone  100 mg daily and would favor trying to stop this in the future.  4. PVCs/NSVT: Has ICD, on amiodarone .  5. Sleep apnea: mild sleep apnea. Dr Dorine office number provided at last visit so they can reach out regarding treatment.    Return in 2 months, sooner if needed.   I spent 30 minutes reviewing records, interviewing/ examing patient and managing plan/ orders.   Ellouise DELENA Class 03/11/2024

## 2024-03-12 ENCOUNTER — Encounter: Payer: Self-pay | Admitting: Family

## 2024-03-12 ENCOUNTER — Ambulatory Visit: Attending: Family | Admitting: Family

## 2024-03-12 VITALS — BP 101/71 | HR 63 | Wt 183.2 lb

## 2024-03-12 DIAGNOSIS — R7989 Other specified abnormal findings of blood chemistry: Secondary | ICD-10-CM | POA: Diagnosis not present

## 2024-03-12 DIAGNOSIS — I493 Ventricular premature depolarization: Secondary | ICD-10-CM | POA: Insufficient documentation

## 2024-03-12 DIAGNOSIS — I428 Other cardiomyopathies: Secondary | ICD-10-CM | POA: Insufficient documentation

## 2024-03-12 DIAGNOSIS — I472 Ventricular tachycardia, unspecified: Secondary | ICD-10-CM | POA: Diagnosis not present

## 2024-03-12 DIAGNOSIS — I130 Hypertensive heart and chronic kidney disease with heart failure and stage 1 through stage 4 chronic kidney disease, or unspecified chronic kidney disease: Secondary | ICD-10-CM | POA: Diagnosis not present

## 2024-03-12 DIAGNOSIS — Z7984 Long term (current) use of oral hypoglycemic drugs: Secondary | ICD-10-CM | POA: Diagnosis not present

## 2024-03-12 DIAGNOSIS — I480 Paroxysmal atrial fibrillation: Secondary | ICD-10-CM | POA: Diagnosis not present

## 2024-03-12 DIAGNOSIS — Z79899 Other long term (current) drug therapy: Secondary | ICD-10-CM | POA: Diagnosis not present

## 2024-03-12 DIAGNOSIS — N183 Chronic kidney disease, stage 3 unspecified: Secondary | ICD-10-CM | POA: Diagnosis not present

## 2024-03-12 DIAGNOSIS — I5022 Chronic systolic (congestive) heart failure: Secondary | ICD-10-CM | POA: Insufficient documentation

## 2024-03-12 DIAGNOSIS — I2510 Atherosclerotic heart disease of native coronary artery without angina pectoris: Secondary | ICD-10-CM | POA: Insufficient documentation

## 2024-03-12 DIAGNOSIS — G4730 Sleep apnea, unspecified: Secondary | ICD-10-CM | POA: Insufficient documentation

## 2024-03-12 DIAGNOSIS — Z7901 Long term (current) use of anticoagulants: Secondary | ICD-10-CM | POA: Insufficient documentation

## 2024-03-12 DIAGNOSIS — E0590 Thyrotoxicosis, unspecified without thyrotoxic crisis or storm: Secondary | ICD-10-CM | POA: Diagnosis not present

## 2024-03-12 DIAGNOSIS — G4733 Obstructive sleep apnea (adult) (pediatric): Secondary | ICD-10-CM | POA: Diagnosis not present

## 2024-03-12 DIAGNOSIS — K5900 Constipation, unspecified: Secondary | ICD-10-CM | POA: Diagnosis not present

## 2024-03-12 DIAGNOSIS — Z95810 Presence of automatic (implantable) cardiac defibrillator: Secondary | ICD-10-CM | POA: Diagnosis not present

## 2024-03-12 NOTE — Patient Instructions (Signed)
 It was good to see you today! Medication Changes:  YOU MAY TAKE: overt the counter colace and or Miralax   Lab Work:  Go downstairs to [facility] on [address] to have your blood work completed.  We will only call you if the results are abnormal or if the provider would like to make medication changes.  No news is good news.   Follow-Up in: Please follow up with the Advanced Heart Failure Clinic in 2 months with Ellouise Class, FNP.   Thank you for choosing [facility] Failure [facility].    At the Advanced Heart Failure Clinic, you and your health needs are our priority. We have a designated team specialized in the treatment of Heart Failure. This Care Team includes your primary Heart Failure Specialized Cardiologist (physician), Advanced Practice Providers (APPs- Physician Assistants and Nurse Practitioners), and Pharmacist who all work together to provide you with the care you need, when you need it.   You may see any of the following providers on your designated Care Team at your next follow up:  Dr. Toribio Fuel Dr. Ezra Shuck Dr. Ria Commander Dr. Morene Brownie Ellouise Class, FNP Jaun Bash, RPH-CPP  Please be sure to bring in all your medications bottles to every appointment.   Need to Contact Us :  If you have any questions or concerns before your next appointment please send us  a message through [place] or call our office at [phone].    TO LEAVE A MESSAGE FOR THE NURSE SELECT OPTION 2, PLEASE LEAVE A MESSAGE INCLUDING: YOUR NAME DATE OF BIRTH CALL BACK NUMBER REASON FOR CALL**this is important as we prioritize the call backs  YOU WILL RECEIVE A CALL BACK THE SAME DAY AS LONG AS YOU CALL BEFORE 4:00 PM

## 2024-03-13 ENCOUNTER — Ambulatory Visit: Payer: Self-pay | Admitting: Family

## 2024-03-13 DIAGNOSIS — I5022 Chronic systolic (congestive) heart failure: Secondary | ICD-10-CM

## 2024-03-13 LAB — BASIC METABOLIC PANEL WITH GFR
BUN/Creatinine Ratio: 15 (ref 10–24)
BUN: 21 mg/dL (ref 8–27)
CO2: 23 mmol/L (ref 20–29)
Calcium: 9 mg/dL (ref 8.6–10.2)
Chloride: 97 mmol/L (ref 96–106)
Creatinine, Ser: 1.44 mg/dL — ABNORMAL HIGH (ref 0.76–1.27)
Glucose: 98 mg/dL (ref 70–99)
Potassium: 4.3 mmol/L (ref 3.5–5.2)
Sodium: 137 mmol/L (ref 134–144)
eGFR: 53 mL/min/1.73 — ABNORMAL LOW (ref >59)

## 2024-03-13 LAB — PRO B NATRIURETIC PEPTIDE: NT-Pro BNP: 2473 pg/mL — ABNORMAL HIGH (ref 0–376)

## 2024-03-14 ENCOUNTER — Telehealth: Payer: Self-pay | Admitting: Cardiology

## 2024-03-14 NOTE — Telephone Encounter (Signed)
 Pt wife requesting callback regarding CPAP being that pt has completed sleep study.

## 2024-03-14 NOTE — Telephone Encounter (Signed)
 Called pt to make aware of lab work. Pt agreeable to limiting fluid and rechecking labs around 03/28/24. Orders placed. No further questions at this time.

## 2024-03-28 ENCOUNTER — Other Ambulatory Visit: Payer: Self-pay | Admitting: Family Medicine

## 2024-03-28 DIAGNOSIS — K219 Gastro-esophageal reflux disease without esophagitis: Secondary | ICD-10-CM

## 2024-03-28 DIAGNOSIS — R1013 Epigastric pain: Secondary | ICD-10-CM

## 2024-04-02 ENCOUNTER — Ambulatory Visit: Payer: Medicare HMO

## 2024-04-02 ENCOUNTER — Ambulatory Visit: Admission: RE | Admit: 2024-04-02 | Discharge: 2024-04-02 | Attending: Family Medicine | Admitting: Family Medicine

## 2024-04-02 DIAGNOSIS — R1013 Epigastric pain: Secondary | ICD-10-CM | POA: Diagnosis present

## 2024-04-02 DIAGNOSIS — K219 Gastro-esophageal reflux disease without esophagitis: Secondary | ICD-10-CM | POA: Insufficient documentation

## 2024-04-02 DIAGNOSIS — I480 Paroxysmal atrial fibrillation: Secondary | ICD-10-CM | POA: Diagnosis not present

## 2024-04-04 LAB — CUP PACEART REMOTE DEVICE CHECK
Battery Remaining Longevity: 41 mo
Battery Voltage: 2.96 V
Brady Statistic RV Percent Paced: 0.3 %
Date Time Interrogation Session: 20251215002205
HighPow Impedance: 49 Ohm
Implantable Lead Connection Status: 753985
Implantable Lead Implant Date: 20170428
Implantable Lead Location: 753862
Implantable Pulse Generator Implant Date: 20170428
Lead Channel Impedance Value: 266 Ohm
Lead Channel Impedance Value: 323 Ohm
Lead Channel Pacing Threshold Amplitude: 1 V
Lead Channel Pacing Threshold Pulse Width: 0.4 ms
Lead Channel Sensing Intrinsic Amplitude: 8.25 mV
Lead Channel Sensing Intrinsic Amplitude: 8.25 mV
Lead Channel Setting Pacing Amplitude: 2 V
Lead Channel Setting Pacing Pulse Width: 0.4 ms
Lead Channel Setting Sensing Sensitivity: 0.3 mV
Zone Setting Status: 755011
Zone Setting Status: 755011

## 2024-04-06 ENCOUNTER — Ambulatory Visit: Payer: Self-pay | Admitting: Cardiology

## 2024-04-06 NOTE — Progress Notes (Signed)
 Remote ICD Transmission

## 2024-04-23 ENCOUNTER — Telehealth: Payer: Self-pay | Admitting: Family

## 2024-04-23 NOTE — Telephone Encounter (Signed)
 Spoke to pt. Pt states that his swelling occurs when he sleeps with his feet dependent and resolves if he elevate his legs for a bit. Pt bp today is 94/76. Weight stable at 183lbs. Verified medications with pt. Pt not taking medications as prescribed. Pt stated he was taking metoprolol  either 12.5 or 25mg  depending on what his blood pressure is but had taken 25mg  yesterday. Reviewed bp parameters with pt. Pt also stated that he was taking torsemide  20-30mg  as needed. Advised to take medication as prescribed at 20mg  daily. Advised after taking medications as prescribed, call if his symptoms do not resolve.

## 2024-04-30 ENCOUNTER — Telehealth: Payer: Self-pay | Admitting: Family

## 2024-05-01 ENCOUNTER — Encounter: Payer: Self-pay | Admitting: Family

## 2024-05-01 ENCOUNTER — Ambulatory Visit: Attending: Family | Admitting: Family

## 2024-05-01 ENCOUNTER — Telehealth: Payer: Self-pay

## 2024-05-01 VITALS — BP 114/64 | HR 74 | Wt 195.4 lb

## 2024-05-01 DIAGNOSIS — Z95810 Presence of automatic (implantable) cardiac defibrillator: Secondary | ICD-10-CM | POA: Diagnosis present

## 2024-05-01 DIAGNOSIS — I5022 Chronic systolic (congestive) heart failure: Secondary | ICD-10-CM | POA: Diagnosis not present

## 2024-05-01 DIAGNOSIS — Z79899 Other long term (current) drug therapy: Secondary | ICD-10-CM | POA: Diagnosis not present

## 2024-05-01 DIAGNOSIS — Z7984 Long term (current) use of oral hypoglycemic drugs: Secondary | ICD-10-CM | POA: Insufficient documentation

## 2024-05-01 DIAGNOSIS — N183 Chronic kidney disease, stage 3 unspecified: Secondary | ICD-10-CM | POA: Insufficient documentation

## 2024-05-01 DIAGNOSIS — I493 Ventricular premature depolarization: Secondary | ICD-10-CM | POA: Insufficient documentation

## 2024-05-01 DIAGNOSIS — E0590 Thyrotoxicosis, unspecified without thyrotoxic crisis or storm: Secondary | ICD-10-CM | POA: Diagnosis not present

## 2024-05-01 DIAGNOSIS — I340 Nonrheumatic mitral (valve) insufficiency: Secondary | ICD-10-CM | POA: Diagnosis not present

## 2024-05-01 DIAGNOSIS — Z7901 Long term (current) use of anticoagulants: Secondary | ICD-10-CM | POA: Insufficient documentation

## 2024-05-01 DIAGNOSIS — I480 Paroxysmal atrial fibrillation: Secondary | ICD-10-CM | POA: Insufficient documentation

## 2024-05-01 DIAGNOSIS — G4730 Sleep apnea, unspecified: Secondary | ICD-10-CM | POA: Diagnosis not present

## 2024-05-01 DIAGNOSIS — G4733 Obstructive sleep apnea (adult) (pediatric): Secondary | ICD-10-CM

## 2024-05-01 DIAGNOSIS — I428 Other cardiomyopathies: Secondary | ICD-10-CM | POA: Insufficient documentation

## 2024-05-01 LAB — BASIC METABOLIC PANEL WITH GFR
BUN/Creatinine Ratio: 14 (ref 10–24)
BUN: 17 mg/dL (ref 8–27)
CO2: 22 mmol/L (ref 20–29)
Calcium: 9 mg/dL (ref 8.6–10.2)
Chloride: 99 mmol/L (ref 96–106)
Creatinine, Ser: 1.18 mg/dL (ref 0.76–1.27)
Glucose: 96 mg/dL (ref 70–99)
Potassium: 4 mmol/L (ref 3.5–5.2)
Sodium: 138 mmol/L (ref 134–144)
eGFR: 67 mL/min/1.73

## 2024-05-01 LAB — MAGNESIUM: Magnesium: 1.8 mg/dL (ref 1.6–2.3)

## 2024-05-01 NOTE — Patient Instructions (Signed)
 Medication Changes:  No medication changes today!  Lab Work:   Go downstairs to [facility] on [address] to have your blood work completed.  We will only call you if the results are abnormal or if the provider would like to make medication changes.  No news is good news.    Follow-Up in: Please follow up with the Advanced Heart Failure Clinic in 3 months with Dr. Rolan. We do not currently have that schedule. Call in March in order to schedule your appointment for April 2026.   Thank you for choosing [facility] Advanced [facility] Failure [facility].    At the Advanced Heart Failure Clinic, you and your health needs are our priority. We have a designated team specialized in the treatment of Heart Failure. This Care Team includes your primary Heart Failure Specialized Cardiologist (physician), Advanced Practice Providers (APPs- Physician Assistants and Nurse Practitioners), and Pharmacist who all work together to provide you with the care you need, when you need it.   You may see any of the following providers on your designated Care Team at your next follow up:  Dr. Toribio Fuel Dr. Ezra Rolan Dr. Ria Commander Dr. Morene Brownie Ellouise Class, FNP Jaun Bash, RPH-CPP  Please be sure to bring in all your medications bottles to every appointment.   Need to Contact Us :  If you have any questions or concerns before your next appointment please send us  a message through [place] or call our office at [phone].    TO LEAVE A MESSAGE FOR THE NURSE SELECT OPTION 2, PLEASE LEAVE A MESSAGE INCLUDING: YOUR NAME DATE OF BIRTH CALL BACK NUMBER REASON FOR CALL**this is important as we prioritize the call backs  YOU WILL RECEIVE A CALL BACK THE SAME DAY AS LONG AS YOU CALL BEFORE 4:00 PM

## 2024-05-01 NOTE — Progress Notes (Signed)
 "  Advanced Heart Failure Clinic Note   PCP: Sherial Bail, MD Cardiology: Dr. Perla   Chief Complaint: pedal edema   HPI:   Mr Allen is a 69 y.o.  male with a history of paroxysmal atrial fibrillation, nonischemic cardiomyopathy with Medtronic ICD, CKD stage 3, and PVCs returns for followup of CHF.  HF was diagnosed in 2016, LHC at that time revealed minimal CAD.  LVEF was 25%.  He was followed by [facility] clinic and then transitioned to [facility] heart care.  He established with Dr. Gollan on 12/2021 and Dr. Cindie on 01/2022.  He reported NYHA Class II symptoms and lisinopril  was switched to Entresto .      He presented to the ED 10/22 after his defibrillator fired 6 times In the ED he was noted to be in atrial fibrillation. ICD interogation showed inappropriate ICD firing for AF. Started on amiodarone . Also found to have frequent PVCs.   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      Had AF ablation on 05/24/22. TEE at that time EF 20-25%, mild to moderate MR.   Patient was admitted in 8/25 with severe PNA.  Entresto , spironolactone , and Toprol  XL were stopped due to low blood pressure.  Echo in 8/25 showed EF < 20%, severe LV dilation, moderate RV dysfunction, mild-moderate MR.   CPX in 8/25 showed severe functional limitation due to heart failure.   Seen in [facility] 01/04/24 where there was some confusion with patient regarding his medications. Digoxin  0.125mg  daiy was started. Torsemide  every other day. Amiodarone  decreased.   Was in the ED 02/09/24 with cough/ pedal edema. CXR showed pneumonia. BNP elevated and 1 dose of IV lasix  given.   Seen in [facility] 02/23/24 fluid up and torsemide  dose increased. After lab results obtained, torsemide  increased even further.   Patient presents today for an acute HF visit with a chief complaint of pedal edema. Minimal SOB with work [employer], [employer]), occasional dizziness. Sleeping well on 1 pillow. Appetite decreased. Denies fatigue, chest pain,  palpitations, abdominal distention.   Medtronic device interrogation: Optivol level above threshold and rising.  99.8% VS, 0.2% VP, no shocks, patient activity 1.9 hr / day  ECG: NSR with PVC (personally reviewed)  Labs (8/25): K 3.7, creatinine 1.3 => 1.37, BNP 300, TSH 0.08 (low), LFTs normal.  Labs (9/25): K 4.0, creatinine 1.17, BNP 721.8, TSH 0.261 Labs (10/25): K 4.0, creatinine 1.33, BNP 3386.7, HS trop 22, Hg 13 Labs (11/25): K 4.3, creatinine 1.17 Labs (11/25): K 4.3, creatinine 1.44, proBNP 2473  PMH: 1. Chronic systolic CHF: Nonischemic cardiomyopathy.  Medtronic ICD.  - Echo (8/16): EF 25-30%. - LHC (2016): No significant CAD.  - Echo (10/23): EF 20-25% - LHC (10/23): Mild nonobstructive CAD - Echo (8/25): EF < 20%, severe LV dilation, moderate RV dysfunction, mild-moderate MR.  - CPX (8/25): Peak VO2 8.7, RER 1.1, VE/VCO2 slope 58. Severe functional limitation due to HF.  2. Atrial fibrillation: Paroxysmal.  Ablation in 2/24.  3. CKD stage 3 4. PVCs/NSVT 5. HTN 6. Nephrolithiasis  ROS: All systems reviewed and negative except as per HPI.   Past Medical History:  Diagnosis Date   Anxiety    Arrhythmia    CHF (congestive heart failure) (HCC)    Hypertension    Kidney stones    Shortness of breath dyspnea    Past Surgical History:  Procedure Laterality Date   ATRIAL FIBRILLATION ABLATION N/A 05/24/2022   Procedure: ATRIAL FIBRILLATION ABLATION;  Surgeon: Cindie Smalls  T, MD;  Location: [facility] INVASIVE CV LAB;  Service: Cardiovascular;  Laterality: N/A;   CARDIAC CATHETERIZATION Right 11/20/2014   Procedure: Left Heart Cath and Coronary Angiography;  Surgeon: Marsa Dooms, MD;  Location: [facility] INVASIVE CV LAB;  Service: Cardiovascular;  Laterality: Right;   IMPLANTABLE CARDIOVERTER DEFIBRILLATOR (ICD) GENERATOR CHANGE Left 08/15/2015   Procedure: ICD IMPLANT, single chamber;  Surgeon: Franky LITTIE Ned, MD;  Location: [facility] ORS;  Service: Cardiovascular;  Laterality:  Left;   KNEE SURGERY     RIGHT/LEFT HEART CATH AND CORONARY ANGIOGRAPHY N/A 02/10/2022   Procedure: RIGHT/LEFT HEART CATH AND CORONARY ANGIOGRAPHY;  Surgeon: Mady Bruckner, MD;  Location: [facility] INVASIVE CV LAB;  Service: Cardiovascular;  Laterality: N/A;   TEE WITHOUT CARDIOVERSION N/A 05/24/2022   Procedure: TRANSESOPHAGEAL ECHOCARDIOGRAM;  Surgeon: Cindie Ole DASEN, MD;  Location: [facility] INVASIVE CV LAB;  Service: Cardiovascular;  Laterality: N/A;   Family History  Problem Relation Age of Onset   Hyperlipidemia Mother    Hypertension Father    Heart disease Father    Hyperlipidemia Brother    Diabetes Brother    Heart failure Neg Hx    Social History   Tobacco Use   Smoking status: Never   Smokeless tobacco: Never   Tobacco comments:    Never smoke 06/21/22  Substance Use Topics   Alcohol use: No   Allergies[1] shellfish, rosuvastatin   Prior to Admission medications  Medication Sig Start Date End Date Taking? Authorizing Provider  albuterol  (PROVENTIL  HFA;VENTOLIN  HFA) 108 (90 Base) MCG/ACT inhaler Inhale 1 puff into the lungs every 6 (six) hours as needed for wheezing or shortness of breath.    [provider]  amiodarone  (PACERONE ) 100 MG tablet Take 1 tablet (100 mg total) by mouth daily. 01/04/24   Rolan Ezra RAMAN, MD  apixaban  (ELIQUIS ) 5 MG TABS tablet Take 1 tablet (5 mg total) by mouth 2 (two) times daily. 10/31/23   Gollan, Timothy J, MD  Ascorbic Acid  (VITAMIN C ) 1000 MG tablet Take 1,000 mg by mouth daily.    [provider]  Cholecalciferol 125 MCG (5000 UT) CHEW 1 capsule.    [provider]  cyanocobalamin  2000 MCG tablet Take 2,000 mcg by mouth daily.    [provider]  digoxin  (LANOXIN ) 0.125 MG tablet Take 1 tablet (0.125 mg total) by mouth daily. 01/04/24   Rolan Ezra RAMAN, MD  empagliflozin  (JARDIANCE ) 10 MG TABS tablet Take 1 tablet (10 mg total) by mouth daily. 10/31/23   Gollan, Timothy J, MD  fluticasone  (FLONASE ) 50  MCG/ACT nasal spray Place 1 spray into both nostrils in the morning and at bedtime. 05/26/23   [provider]  hydrOXYzine (ATARAX) 25 MG tablet Take 25 mg by mouth every 8 (eight) hours as needed for anxiety.    [provider]  metoprolol  succinate (TOPROL -XL) 25 MG 24 hr tablet Take 0.5 tablets (12.5 mg total) by mouth daily. Take with or immediately following a meal. 01/04/24 12/29/24  Rolan Ezra RAMAN, MD  Multiple Vitamins-Minerals (SUPER THERA VITE M PO) Take 1 tablet by mouth daily.    [provider]  Naphazoline-Glycerin  (REDNESS RELIEF OP) Place 1 drop into both eyes 2 (two) times daily.    [provider]  pantoprazole  (PROTONIX ) 40 MG tablet Take 40 mg by mouth 2 (two) times daily. 03/07/24 03/07/25  [provider]  pyridoxine  (B-6) 500 MG tablet Take 500 mg by mouth daily.    [provider]  rosuvastatin  (CRESTOR ) 10 MG tablet  Take 1 tablet (10 mg total) by mouth daily. 10/31/23   Gollan, Timothy J, MD  sacubitril -valsartan  (ENTRESTO ) 49-51 MG Take 1 tablet by mouth 2 (two) times daily.    [provider]  sertraline (ZOLOFT) 25 MG tablet Take 25 mg by mouth daily.    [provider]  spironolactone  (ALDACTONE ) 25 MG tablet Take 1 tablet (25 mg total) by mouth daily. 01/04/24 04/03/24  Rolan Ezra RAMAN, MD  torsemide  (DEMADEX ) 20 MG tablet Take 1 tablet (20 mg total) by mouth daily. 02/24/24   Donette Ellouise LABOR, FNP  Vitamin E 180 MG (400 UNIT) CAPS 1 capsule.    [provider]  Zinc Gluconate 100 MG TABS 1 tablet Orally Once a day for health maintenance    [provider]   Vitals:   05/01/24 1423 05/01/24 1441  BP: 114/64   Pulse: 74   SpO2: 98%   Weight: 197 lb 3.2 oz (89.4 kg) 195 lb 6 oz (88.6 kg)   Wt Readings from Last 3 Encounters:  05/01/24 195 lb 6 oz (88.6 kg)  03/12/24 183 lb 4 oz (83.1 kg)  02/23/24 190 lb (86.2 kg)   Lab Results  Component Value Date   CREATININE 1.44 (H)  03/12/2024   CREATININE 1.17 02/23/2024   CREATININE 1.33 (H) 02/09/2024   Physical Exam: General: Well appearing.  Cor: No JVD. Regular rhythm, rate.  Lungs: clear Abdomen: soft, nontender, nondistended. Extremities: 1+ pitting edema bilateral lower legs, + compression stockings Neuro:. Affect pleasant  Assessment/Plan: 1. Chronic systolic CHF: Nonischemic cardiomyopathy.  Medtronic ICD.  Caths in 2016 and 10/23 with minimal CAD.  Echo in 8/16 with EF 25-30%.  Echo in 8/25 with EF < 20%, severe LV dilation, moderate RV dysfunction, mild-moderate MR.  CPX in 9/25 showed severe functional limitation due to heart failure.  CPX is significantly out of proportion to his symptoms, he describes NYHA class II symptoms at most (?under-reporting). Appears fluid up today with rising optivol reading and worsening symptoms - Weight up 12 pounds since last here 6 weeks ago - Increase torsemide  to 60mg  daily. BMET/ Mg level today.  - Reviewed fluid restriction of 60-64 ounces daily. He is unclear how much he drinks.  - Continue compression socks - Continue digoxin  0.125mg  daily. Dig level 02/23/24 was <0.6 - Continue Jardiance  10 mg daily.  - Continue Toprol  XL 12.5 mg daily.  - Continue Entresto  49/51 bid today.  - Continue spironolactone  25 mg daily.  - CPX concerning that patient is nearing the need for advanced therapies though he reports minimal symptoms. He is generally healthy other than his heart problems with a supportive wife. I think he would be a candidate for transplant or for LVAD. Once hyperthyroidism treatment is underway, he will need RHC to assess filling pressures and cardiac output.   - he can take OTC colace/ miralax  for constipation.  - BNP 02/09/24 was 3386.7.  2. Atrial fibrillation: Paroxysmal. S/p ablation in 2/24.   - With suspected hyperthyroidism, amiodarone  had been previously decreased to 100 mg daily. - Continue apixaban  5mg  BID - EKG today is NSR 3. Hyperthyroidism:  TSH was 0.08 in 8/25.  TSH 0.261 9/25. Suspect hyperthyroidism was triggered by amiodarone .  - Has endocrinology appt 03/26 - Amiodarone  100 mg daily and would favor trying to stop this in the future.  4. PVCs/NSVT: Has ICD, on amiodarone .  5. Sleep apnea: mild sleep apnea per study done 11/30/23. Dr Dorine office number provided at last visit  so they can reach out regarding treatment.    Return in 3 months to see HF MD, sooner if needed.   I spent 49 minutes reviewing records, interviewing/ examing patient and managing plan/ orders.   Ellouise DELENA Class, FNP -C 05/01/2024     [1]  Allergies Allergen Reactions   Shellfish Allergy Anaphylaxis   Shellfish Protein-Containing Drug Products Anaphylaxis   Rosuvastatin      Dizziness    "

## 2024-05-01 NOTE — Telephone Encounter (Addendum)
 Advanced Heart Failure Patient Advocate Encounter  The patient was approved for a [employer] grant that will help cover the cost of Digoxin , Eliquis , Entresto , Jardiance , Metoprolol , Spironolactone .  Total amount awarded, $7,500.  Effective: 04/01/2024 - 03/31/2025.  BIN [id number] PCN [id number] Group [id number] ID [id number]  Pharmacy provided with approval and processing information. Confirmed $0 copay for Eliquis . Patient informed via voicemail.  Rachel DEL, CPhT Rx Patient Advocate Phone: [phone]

## 2024-05-02 ENCOUNTER — Other Ambulatory Visit: Payer: Self-pay | Admitting: Family

## 2024-05-02 ENCOUNTER — Ambulatory Visit: Payer: Self-pay | Admitting: Family

## 2024-05-02 MED ORDER — TORSEMIDE 20 MG PO TABS: 60.0000 mg | ORAL_TABLET | Freq: Every day | ORAL | Status: DC

## 2024-05-07 ENCOUNTER — Ambulatory Visit: Admitting: Family

## 2024-05-08 NOTE — Telephone Encounter (Signed)
 Pt is requesting a callback regarding him wanting an update on his CPAP. Please advise.

## 2024-05-09 ENCOUNTER — Telehealth: Payer: Self-pay | Admitting: Family

## 2024-05-09 NOTE — Telephone Encounter (Signed)
 Patient reports he talked with his dme and is waiting for a call back to schedule his cpap setup.

## 2024-05-10 ENCOUNTER — Telehealth: Payer: Self-pay | Admitting: Family

## 2024-05-10 NOTE — Telephone Encounter (Signed)
 Patient called regarding swelling in both feet, he is wearing compression socks, pt can be reached @[phone]

## 2024-05-15 ENCOUNTER — Encounter: Admitting: Dietician

## 2024-05-23 ENCOUNTER — Ambulatory Visit: Admitting: "Endocrinology

## 2024-05-24 ENCOUNTER — Ambulatory Visit: Admitting: Adult Health

## 2024-05-24 ENCOUNTER — Encounter: Payer: Self-pay | Admitting: Adult Health

## 2024-05-24 ENCOUNTER — Other Ambulatory Visit: Payer: Self-pay

## 2024-05-24 ENCOUNTER — Other Ambulatory Visit (HOSPITAL_COMMUNITY): Payer: Self-pay

## 2024-05-24 ENCOUNTER — Telehealth: Payer: Self-pay

## 2024-05-24 VITALS — BP 102/58 | HR 69 | Wt 192.8 lb

## 2024-05-24 DIAGNOSIS — I480 Paroxysmal atrial fibrillation: Secondary | ICD-10-CM | POA: Diagnosis not present

## 2024-05-24 DIAGNOSIS — E0590 Thyrotoxicosis, unspecified without thyrotoxic crisis or storm: Secondary | ICD-10-CM

## 2024-05-24 DIAGNOSIS — I5022 Chronic systolic (congestive) heart failure: Secondary | ICD-10-CM

## 2024-05-24 MED ORDER — TORSEMIDE 20 MG PO TABS: 80.0000 mg | ORAL_TABLET | Freq: Every day | ORAL | 5 refills | Status: AC

## 2024-05-24 MED ORDER — FUROSCIX 80 MG/10ML ~~LOC~~ CTKT
CARTRIDGE | SUBCUTANEOUS | 0 refills | Status: AC
  Filled 2024-05-24: qty 5, fill #0
  Filled 2024-05-25: qty 1, 1d supply, fill #0
  Filled 2024-05-25 (×2): qty 1, 1d supply, fill #1
  Filled 2024-05-25: qty 2, 2d supply, fill #0

## 2024-05-24 NOTE — Patient Instructions (Addendum)
 Medication Changes:  Your provider has order Furoscix  for you. This is an on-body infuser that gives you a dose of Furosemide .   It will be shipped to your home from [facility], they will call you before shipping  Ensure you write down the time you start your infusion so that if there is a problem you will know how long the infusion lasted  Use Furoscix  only AS DIRECTED by our office  Dosing Directions:   Day 1= Take Furoscix . DO NOT TAKE TORSEMIDE   Day 2= Take Furoscix . DO NOT TAKE TORSEMIDE   Day 3= RESTART Torsemide  at 80mg  (4 tabs) daily all at once    Follow-Up in: Please follow up with the Advanced Heart Failure Clinic in 2 weeks with Dr. Cherrie in the [place] location. The parking code for the underground garage is 2027.   Thank you for choosing [facility] Advanced Heart Failure Clinic.    At the Advanced Heart Failure Clinic, you and your health needs are our priority. We have a designated team specialized in the treatment of Heart Failure. This Care Team includes your primary Heart Failure Specialized Cardiologist (physician), Advanced Practice Providers (APPs- Physician Assistants and Nurse Practitioners), and Pharmacist who all work together to provide you with the care you need, when you need it.   You may see any of the following providers on your designated Care Team at your next follow up:  Dr. Toribio Cherrie Dr. Ezra Shuck Dr. Ria Commander Dr. Morene Brownie Ellouise Class, FNP Jaun Bash, RPH-CPP  Please be sure to bring in all your medications bottles to every appointment.   Need to Contact Us :  If you have any questions or concerns before your next appointment please send us  a message through [place] or call our office at [phone].    TO LEAVE A MESSAGE FOR THE NURSE SELECT OPTION 2, PLEASE LEAVE A MESSAGE INCLUDING: YOUR NAME DATE OF BIRTH CALL BACK NUMBER REASON FOR CALL**this is important as we prioritize  the call backs  YOU WILL RECEIVE A CALL BACK THE SAME DAY AS LONG AS YOU CALL BEFORE 4:00 PM

## 2024-05-24 NOTE — Progress Notes (Signed)
 Education Assessment and Provision: Detailed education and instructions provided on heart failure disease management including the following:  Signs and symptoms of Heart Failure When to call the physician Importance of daily weights Low sodium diet Fluid restriction  Patient education given on each of the above topics.  Patient acknowledges understanding via teach back method and acceptance of all instructions.  Education Materials:  Living Better With Heart Failure Booklet, HF zone tool, & Daily Weight Tracker Tool.  Patient has scale at home: Micaela does perform daily weights  Patient has pill box at home: Yes . Patient shared that he takes his prescribed medications  Charmaine Pines, RN, BSN [facility] Heart Failure Navigator Secure Chat Only

## 2024-05-24 NOTE — Progress Notes (Signed)
 "  Advanced Heart Failure Clinic Note   PCP: Sherial Bail, MD Cardiology: Dr. Perla   Chief Complaint: Heart Failure   HPI:  Jerry Howe is a 69 y.o.  male with a history of paroxysmal atrial fibrillation, nonischemic cardiomyopathy with Medtronic ICD, CKD stage 3, and PVCs returns for followup of CHF.  HF was diagnosed in 2016, LHC at that time revealed minimal CAD.  LVEF was 25%.  He was followed by [facility] clinic and then transitioned to [facility] heart care.  He established with Dr. Gollan on 12/2021 and Dr. Cindie on 01/2022.     He presented to the ED 10/22 after his defibrillator fired 6 times In the ED he was noted to be in atrial fibrillation. ICD interogation showed inappropriate ICD firing for AF. Started on amiodarone . Also found to have frequent PVCs.   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      Had AF ablation on 05/24/22. TEE at that time EF 20-25%, mild to moderate Jerry.   Patient was admitted in 8/25 with severe PNA.  Entresto , spironolactone , and Toprol  XL were stopped due to low blood pressure.  Echo in 8/25 showed EF < 20%, severe LV dilation, moderate RV dysfunction, mild-moderate Jerry.   CPX in 8/25 showed severe functional limitation due to heart failure.   Seen in [facility] 01/04/24 where there was some confusion with patient regarding his medications. Digoxin  0.125mg  daiy was started. Torsemide  every other day. Amiodarone  decreased.   Was in the ED 02/09/24 with cough/ pedal edema. CXR showed pneumonia. BNP elevated and 1 dose of IV lasix  given.   Seen in [facility] 02/23/24 fluid up and torsemide  dose increased. After lab results obtained, torsemide  increased even further.   He was seen 05/01/24 in the HF clinic. He was volume overloaded. Torsemide  was increased to 60 mg daily.   Today he returns for HF follow up due to increased lower extremity edema. Overall feeling fine. His wife tells me he has a hard time walking in the store. Denies PND/Orthopnea. Appetite ok. Eating  lots of processed foods high in sodium and drinks lots of fluids. No fever or chills. Weight at home  has gone up around 8 pounds. Taking all medications. Lives with his wife    Medtronic device interrogation: Unable to transmit.    Labs (8/25): K 3.7, creatinine 1.3 => 1.37, BNP 300, TSH 0.08 (low), LFTs normal.  Labs (9/25): K 4.0, creatinine 1.17, BNP 721.8, TSH 0.261 Labs (10/25): K 4.0, creatinine 1.33, BNP 3386.7, HS trop 22, Hg 13 Labs (11/25): K 4.3, creatinine 1.17 Labs (11/25): K 4.3, creatinine 1.44, proBNP 2473  PMH: 1. Chronic systolic CHF: Nonischemic cardiomyopathy.  Medtronic ICD.  - Echo (8/16): EF 25-30%. - LHC (2016): No significant CAD.  - Echo (10/23): EF 20-25% - LHC (10/23): Mild nonobstructive CAD - Echo (8/25): EF < 20%, severe LV dilation, moderate RV dysfunction, mild-moderate Jerry.  - CPX (8/25): Peak VO2 8.7, RER 1.1, VE/VCO2 slope 58. Severe functional limitation due to HF.  2. Atrial fibrillation: Paroxysmal.  Ablation in 2/24.  3. CKD stage 3 4. PVCs/NSVT 5. HTN 6. Nephrolithiasis  ROS: All systems reviewed and negative except as per HPI.   Past Medical History:  Diagnosis Date   Anxiety    Arrhythmia    CHF (congestive heart failure) (HCC)    Hypertension    Kidney stones    Shortness of breath dyspnea    Past Surgical History:  Procedure Laterality Date  ATRIAL FIBRILLATION ABLATION N/A 05/24/2022   Procedure: ATRIAL FIBRILLATION ABLATION;  Surgeon: Cindie Ole DASEN, MD;  Location: [facility] INVASIVE CV LAB;  Service: Cardiovascular;  Laterality: N/A;   CARDIAC CATHETERIZATION Right 11/20/2014   Procedure: Left Heart Cath and Coronary Angiography;  Surgeon: Marsa Dooms, MD;  Location: [facility] INVASIVE CV LAB;  Service: Cardiovascular;  Laterality: Right;   IMPLANTABLE CARDIOVERTER DEFIBRILLATOR (ICD) GENERATOR CHANGE Left 08/15/2015   Procedure: ICD IMPLANT, single chamber;  Surgeon: Franky LITTIE Ned, MD;  Location: [facility] ORS;  Service:  Cardiovascular;  Laterality: Left;   KNEE SURGERY     RIGHT/LEFT HEART CATH AND CORONARY ANGIOGRAPHY N/A 02/10/2022   Procedure: RIGHT/LEFT HEART CATH AND CORONARY ANGIOGRAPHY;  Surgeon: Mady Bruckner, MD;  Location: [facility] INVASIVE CV LAB;  Service: Cardiovascular;  Laterality: N/A;   TEE WITHOUT CARDIOVERSION N/A 05/24/2022   Procedure: TRANSESOPHAGEAL ECHOCARDIOGRAM;  Surgeon: Cindie Ole DASEN, MD;  Location: [facility] INVASIVE CV LAB;  Service: Cardiovascular;  Laterality: N/A;   Family History  Problem Relation Age of Onset   Hyperlipidemia Mother    Hypertension Father    Heart disease Father    Hyperlipidemia Brother    Diabetes Brother    Heart failure Neg Hx    Social History   Tobacco Use   Smoking status: Never   Smokeless tobacco: Never   Tobacco comments:    Never smoke 06/21/22  Substance Use Topics   Alcohol use: No   Allergies[1] shellfish, rosuvastatin   Prior to Admission medications  Medication Sig Start Date End Date Taking? Authorizing Provider  albuterol  (PROVENTIL  HFA;VENTOLIN  HFA) 108 (90 Base) MCG/ACT inhaler Inhale 1 puff into the lungs every 6 (six) hours as needed for wheezing or shortness of breath.    [provider]  amiodarone  (PACERONE ) 100 MG tablet Take 1 tablet (100 mg total) by mouth daily. 01/04/24   Rolan Ezra RAMAN, MD  apixaban  (ELIQUIS ) 5 MG TABS tablet Take 1 tablet (5 mg total) by mouth 2 (two) times daily. 10/31/23   Gollan, Timothy J, MD  Ascorbic Acid  (VITAMIN C ) 1000 MG tablet Take 1,000 mg by mouth daily.    [provider]  Cholecalciferol 125 MCG (5000 UT) CHEW 1 capsule.    [provider]  cyanocobalamin  2000 MCG tablet Take 2,000 mcg by mouth daily.    [provider]  digoxin  (LANOXIN ) 0.125 MG tablet Take 1 tablet (0.125 mg total) by mouth daily. 01/04/24   Rolan Ezra RAMAN, MD  empagliflozin  (JARDIANCE ) 10 MG TABS tablet Take 1 tablet (10 mg total) by mouth daily. 10/31/23   Gollan, Timothy J, MD   fluticasone  (FLONASE ) 50 MCG/ACT nasal spray Place 1 spray into both nostrils in the morning and at bedtime. 05/26/23   [provider]  hydrOXYzine (ATARAX) 25 MG tablet Take 25 mg by mouth every 8 (eight) hours as needed for anxiety.    [provider]  metoprolol  succinate (TOPROL -XL) 25 MG 24 hr tablet Take 0.5 tablets (12.5 mg total) by mouth daily. Take with or immediately following a meal. 01/04/24 12/29/24  Rolan Ezra RAMAN, MD  Multiple Vitamins-Minerals (SUPER THERA VITE M PO) Take 1 tablet by mouth daily.    [provider]  Naphazoline-Glycerin  (REDNESS RELIEF OP) Place 1 drop into both eyes 2 (two) times daily.    [provider]  pantoprazole  (PROTONIX ) 40 MG tablet Take 40 mg by mouth 2 (two) times daily. 03/07/24 03/07/25  [provider]  pyridoxine  (B-6) 500 MG tablet Take 500 mg  by mouth daily.    [provider]  rosuvastatin  (CRESTOR ) 10 MG tablet Take 1 tablet (10 mg total) by mouth daily. 10/31/23   Gollan, Timothy J, MD  sacubitril -valsartan  (ENTRESTO ) 49-51 MG Take 1 tablet by mouth 2 (two) times daily.    [provider]  sertraline (ZOLOFT) 25 MG tablet Take 25 mg by mouth daily.    [provider]  spironolactone  (ALDACTONE ) 25 MG tablet Take 1 tablet (25 mg total) by mouth daily. 01/04/24 04/03/24  Rolan Ezra RAMAN, MD  torsemide  (DEMADEX ) 20 MG tablet Take 1 tablet (20 mg total) by mouth daily. 02/24/24   Donette Ellouise LABOR, FNP  Vitamin E 180 MG (400 UNIT) CAPS 1 capsule.    [provider]  Zinc Gluconate 100 MG TABS 1 tablet Orally Once a day for health maintenance    [provider]   There were no vitals filed for this visit.  Wt Readings from Last 3 Encounters:  05/01/24 195 lb 6 oz (88.6 kg)  03/12/24 183 lb 4 oz (83.1 kg)  02/23/24 190 lb (86.2 kg)   Lab Results  Component Value Date   CREATININE 1.18 05/01/2024   CREATININE 1.44 (H) 03/12/2024   CREATININE 1.17  02/23/2024   Physical Exam: General:   No resp difficulty Neck: JVP 11-12  Cor: Regular rate & rhythm. Lungs: clear Abdomen: soft, nontender, nondistended.  Extremities: R and LLE 2+  edema Neuro: alert & oriented x3  Assessment/Plan: 1. Chronic systolic CHF: Nonischemic cardiomyopathy.  Medtronic ICD.  Caths in 2016 and 10/23 with minimal CAD.  Echo in 8/16 with EF 25-30%.  Echo in 8/25 with EF < 20%, severe LV dilation, moderate RV dysfunction, mild-moderate Jerry.  CPX in 9/25 showed severe functional limitation due to heart failure.  CPX is significantly out of proportion to his symptoms His wife reports functional limitations but he seems to under report his functional condition. She describes frequent rest breaks when ambulating. I am concerned he may need advanced therapies but I am not sure he wants to pursue. He tells me he doesn't want something else stuck in his body. I have asked him to meet with Dr Cherrie to discuss all options. May need RHC to further assess hemodynamics.  - NYHA III. Volume status elevated. Diet/fluid intake impacting volume management.  - We are going to try and use Furoscix  for a couple of days  FUROSCIX  prescribed  Patient viewed patient education video with QR code for FUROSCIX    QR code for FUROSCIX  placed on AVS  Call FUROSCIX  Direct at [phone] for questions regarding on body infuser. Day 1  FUROSCIX  80 mg once daily  via on body infuser . Hold torsemide   Day 2  FUROSCIX  80 mg once daily  via on body infuser. Hold torsemide .  Day 3 Increase torsemide  to 80 mg daily. - Continue digoxin  0.125mg  daily. Dig level 02/23/24 was <0.6 - Continue Jardiance  10 mg daily.  - Continue Toprol  XL 12.5 mg daily.  - Continue Entresto  49/51 bid today.  - Continue spironolactone  25 mg daily.  - We discussed low salt food choices and limiting fluid intake.  - 2. Atrial fibrillation: Paroxysmal. S/p ablation in 2/24.   - With suspected hyperthyroidism,  amiodarone  had been previously decreased to 100 mg daily. - Continue apixaban  5mg  BID -Regular on exam.  3. Hyperthyroidism: TSH was 0.08  in 8/25.  TSH 0.261 9/25. Suspect hyperthyroidism was triggered by amiodarone .  - Has endocrinology appt next month.  -  Amiodarone  100 mg daily and would favor trying to stop this in the future.  4. PVCs/NSVT: Has ICD, on amiodarone .  5. Sleep apnea: mild sleep apnea per study done 11/30/23. Dr Dorine office number provided at last visit so they can reach out regarding treatment.   I spent 40  minutes reviewing records, interviewing/examining patient, and managing orders. He was given weight chart, HF book, and fluid chart.   Follow up with Dr Cherrie in [place] HF office in a few weeks.   Jerry Mosses, NP -C 05/24/24     [1]  Allergies Allergen Reactions   Shellfish Allergy Anaphylaxis   Shellfish Protein-Containing Drug Products Anaphylaxis   Rosuvastatin      Dizziness    "

## 2024-05-24 NOTE — Telephone Encounter (Signed)
 Advanced Heart Failure Patient Advocate Encounter  Prior authorization for Furoscix  has been submitted and approved. Test billing returns $461.03 for 1 day supply, $1431.70 for 5 day supply.  [employer] [place] Effective: 04/19/2024 to 04/18/2025  Rachel DEL, CPhT Rx Patient Advocate Phone: [phone]

## 2024-05-25 ENCOUNTER — Telehealth (HOSPITAL_COMMUNITY): Payer: Self-pay | Admitting: Adult Health

## 2024-05-25 ENCOUNTER — Telehealth: Payer: Self-pay | Admitting: Pharmacist

## 2024-05-25 ENCOUNTER — Other Ambulatory Visit (HOSPITAL_COMMUNITY): Payer: Self-pay

## 2024-05-25 ENCOUNTER — Other Ambulatory Visit: Payer: Self-pay

## 2024-05-25 ENCOUNTER — Encounter (HOSPITAL_COMMUNITY): Payer: Self-pay

## 2024-05-25 NOTE — Progress Notes (Signed)
 Specialty Pharmacy Initial Fill Coordination Note  Jerry Howe is a 69 y.o. male contacted today regarding initial fill of specialty medication(s) Furosemide  (Furoscix )   Patient requested Marylyn at [facility] at [address]   Delivery date: No data recorded  Verified address: No data recorded  Medication will be filled on: 05/25/24   Patient is aware of $461.03 copayment.

## 2024-05-25 NOTE — Telephone Encounter (Signed)
 The pt called back and agreed to paying the price for furoscix . He is willing to put down a certain amount and put the rest on a payment plan. He only wants the 2 kits for tomorrow and Sunday (after church) and then will start the torsemide  80mg  daily. Gave him the phone number to [facility].

## 2024-05-25 NOTE — Telephone Encounter (Signed)
 Patient would like to pay for Furoscix  out of pocket. [facility] pharmacy to fill today and patient will drive to pick up today.

## 2024-05-25 NOTE — Telephone Encounter (Signed)
" °  Unable to obtain Furoscix  due to cost.   Will need to increase oral diuretics.   Increase torsemide  80 mg twice a day x 2 days then back to 80 torsemide  daily.   Please call.   Jhaniya Briski NP-C  1:37 PM    "

## 2024-05-25 NOTE — Telephone Encounter (Signed)
 Called pt to make aware of new plan for diuretics. Pt verbalized understanding. Pt hesitant to increase torsemide  because he states it makes his stomach hurt. He states I just think its too much. Pt verbalized the temporary change with the permanent change via teach back. No further questions at this time.

## 2024-05-29 ENCOUNTER — Ambulatory Visit: Admitting: Family

## 2024-06-05 ENCOUNTER — Ambulatory Visit: Admitting: Cardiovascular Disease

## 2024-06-06 ENCOUNTER — Ambulatory Visit (HOSPITAL_COMMUNITY): Admitting: Internal Medicine

## 2024-06-06 ENCOUNTER — Encounter: Admitting: Dietician

## 2024-07-05 ENCOUNTER — Ambulatory Visit: Admitting: "Endocrinology
